# Patient Record
Sex: Female | Born: 1950 | Race: White | Hispanic: No | Smoking: Former smoker
Health system: Southern US, Community
[De-identification: ages and names within clinical notes are randomized; demographics above are authoritative.]

## PROBLEM LIST (undated history)

## (undated) DIAGNOSIS — C801 Malignant (primary) neoplasm, unspecified: Secondary | ICD-10-CM

## (undated) DIAGNOSIS — Z87442 Personal history of urinary calculi: Secondary | ICD-10-CM

## (undated) DIAGNOSIS — E039 Hypothyroidism, unspecified: Secondary | ICD-10-CM

## (undated) DIAGNOSIS — N189 Chronic kidney disease, unspecified: Secondary | ICD-10-CM

## (undated) DIAGNOSIS — N200 Calculus of kidney: Secondary | ICD-10-CM

## (undated) DIAGNOSIS — Z148 Genetic carrier of other disease: Secondary | ICD-10-CM

## (undated) DIAGNOSIS — E079 Disorder of thyroid, unspecified: Secondary | ICD-10-CM

## (undated) DIAGNOSIS — M8580 Other specified disorders of bone density and structure, unspecified site: Secondary | ICD-10-CM

## (undated) DIAGNOSIS — K635 Polyp of colon: Secondary | ICD-10-CM

## (undated) HISTORY — DX: Other specified disorders of bone density and structure, unspecified site: M85.80

## (undated) HISTORY — PX: DILATION AND CURETTAGE OF UTERUS: SHX78

## (undated) HISTORY — DX: Disorder of thyroid, unspecified: E07.9

## (undated) HISTORY — DX: Chronic kidney disease, unspecified: N18.9

## (undated) HISTORY — DX: Polyp of colon: K63.5

## (undated) HISTORY — PX: OTHER SURGICAL HISTORY: SHX169

## (undated) HISTORY — DX: Genetic carrier of other disease: Z14.8

## (undated) HISTORY — DX: Calculus of kidney: N20.0

---

## 2003-12-31 HISTORY — PX: JOINT REPLACEMENT: SHX530

## 2006-03-14 ENCOUNTER — Ambulatory Visit: Payer: Self-pay | Admitting: General Surgery

## 2006-03-14 HISTORY — PX: COLONOSCOPY W/ BIOPSIES: SHX1374

## 2007-12-31 DIAGNOSIS — K635 Polyp of colon: Secondary | ICD-10-CM

## 2007-12-31 HISTORY — DX: Polyp of colon: K63.5

## 2010-03-23 ENCOUNTER — Ambulatory Visit: Payer: Self-pay | Admitting: General Surgery

## 2010-03-23 HISTORY — PX: COLONOSCOPY: SHX174

## 2010-04-18 ENCOUNTER — Ambulatory Visit: Payer: Self-pay | Admitting: Unknown Physician Specialty

## 2010-05-10 ENCOUNTER — Ambulatory Visit: Payer: Self-pay | Admitting: General Practice

## 2010-05-17 ENCOUNTER — Ambulatory Visit: Payer: Self-pay | Admitting: Urology

## 2010-06-01 ENCOUNTER — Ambulatory Visit: Payer: Self-pay | Admitting: Urology

## 2010-07-26 ENCOUNTER — Ambulatory Visit: Payer: Self-pay | Admitting: Urology

## 2011-02-13 ENCOUNTER — Ambulatory Visit: Payer: Self-pay | Admitting: Internal Medicine

## 2011-02-13 ENCOUNTER — Ambulatory Visit: Payer: Self-pay | Admitting: Urology

## 2011-02-26 ENCOUNTER — Ambulatory Visit: Payer: Self-pay | Admitting: Internal Medicine

## 2011-02-28 ENCOUNTER — Ambulatory Visit: Payer: Self-pay | Admitting: Internal Medicine

## 2011-02-28 DIAGNOSIS — Z148 Genetic carrier of other disease: Secondary | ICD-10-CM

## 2011-02-28 HISTORY — DX: Genetic carrier of other disease: Z14.8

## 2011-03-10 LAB — HM MAMMOGRAPHY: HM Mammogram: NORMAL

## 2011-03-31 ENCOUNTER — Ambulatory Visit: Payer: Self-pay | Admitting: Internal Medicine

## 2011-06-26 ENCOUNTER — Ambulatory Visit: Payer: Self-pay | Admitting: Urology

## 2011-09-13 ENCOUNTER — Ambulatory Visit: Payer: Self-pay | Admitting: Internal Medicine

## 2011-09-25 ENCOUNTER — Encounter: Payer: Self-pay | Admitting: Internal Medicine

## 2011-09-25 ENCOUNTER — Ambulatory Visit (INDEPENDENT_AMBULATORY_CARE_PROVIDER_SITE_OTHER): Payer: PRIVATE HEALTH INSURANCE | Admitting: Internal Medicine

## 2011-09-25 VITALS — BP 100/60 | HR 67 | Temp 97.9°F | Resp 16 | Ht 64.0 in | Wt 121.8 lb

## 2011-09-25 DIAGNOSIS — M899 Disorder of bone, unspecified: Secondary | ICD-10-CM

## 2011-09-25 DIAGNOSIS — M1610 Unilateral primary osteoarthritis, unspecified hip: Secondary | ICD-10-CM

## 2011-09-25 DIAGNOSIS — Z124 Encounter for screening for malignant neoplasm of cervix: Secondary | ICD-10-CM

## 2011-09-25 DIAGNOSIS — M8580 Other specified disorders of bone density and structure, unspecified site: Secondary | ICD-10-CM | POA: Insufficient documentation

## 2011-09-25 DIAGNOSIS — Z1239 Encounter for other screening for malignant neoplasm of breast: Secondary | ICD-10-CM

## 2011-09-25 DIAGNOSIS — D126 Benign neoplasm of colon, unspecified: Secondary | ICD-10-CM | POA: Insufficient documentation

## 2011-09-25 DIAGNOSIS — Z87898 Personal history of other specified conditions: Secondary | ICD-10-CM | POA: Insufficient documentation

## 2011-09-25 DIAGNOSIS — D709 Neutropenia, unspecified: Secondary | ICD-10-CM

## 2011-09-25 DIAGNOSIS — E039 Hypothyroidism, unspecified: Secondary | ICD-10-CM

## 2011-09-25 DIAGNOSIS — Z8669 Personal history of other diseases of the nervous system and sense organs: Secondary | ICD-10-CM

## 2011-09-25 DIAGNOSIS — M169 Osteoarthritis of hip, unspecified: Secondary | ICD-10-CM | POA: Insufficient documentation

## 2011-09-25 NOTE — Assessment & Plan Note (Signed)
Last TSH was apparently in [place].  Repeat will be done next week.

## 2011-09-25 NOTE — Assessment & Plan Note (Signed)
By prior DEXA ordered by Annett Fabian, done at [facility] , date unclear.  Records requested.  Her rfs included prior history of tobacco abuse and low body weight.

## 2011-09-25 NOTE — Progress Notes (Signed)
  Subjective:    Patient ID: Alexis Mcdonald, female    DOB: 03/05/1951, 60 y.o.   MRN: [id number]  HPI   Healthy 60 yo white female with history of renal calculi presents for establishment of care at the referral of [facility].  No specific complaints today   Review of Systems  Constitutional: Negative for fever, chills and unexpected weight change.  HENT: Negative for hearing loss, ear pain, nosebleeds, congestion, sore throat, facial swelling, rhinorrhea, sneezing, mouth sores, trouble swallowing, neck pain, neck stiffness, voice change, postnasal drip, sinus pressure, tinnitus and ear discharge.   Eyes: Negative for pain, discharge, redness and visual disturbance.  Respiratory: Negative for cough, chest tightness, shortness of breath, wheezing and stridor.   Cardiovascular: Negative for chest pain, palpitations and leg swelling.  Musculoskeletal: Negative for myalgias and arthralgias.  Skin: Negative for color change and rash.  Neurological: Negative for dizziness, weakness, light-headedness and headaches.  Hematological: Negative for adenopathy.       Objective:   Physical Exam  Constitutional: She is oriented to person, place, and time. She appears well-developed and well-nourished.  HENT:  Mouth/Throat: Oropharynx is clear and moist.  Eyes: EOM are normal. Pupils are equal, round, and reactive to light. No scleral icterus.  Neck: Normal range of motion. Neck supple. No JVD present. No thyromegaly present.  Cardiovascular: Normal rate, regular rhythm, normal heart sounds and intact distal pulses.   Pulmonary/Chest: Effort normal and breath sounds normal.  Abdominal: Soft. Bowel sounds are normal. She exhibits no mass. There is no tenderness.  Musculoskeletal: Normal range of motion. She exhibits no edema.  Lymphadenopathy:    She has no cervical adenopathy.  Neurological: She is alert and oriented to person, place, and time.  Skin: Skin is warm and dry.    Psychiatric: She has a normal mood and affect.          Assessment & Plan:

## 2011-09-25 NOTE — Assessment & Plan Note (Signed)
She is a vegetarian, so the possibility of b12 deficiency is raised as a cuase of her neutropenia. She is scheduled for a bone marrow biopsy next week.

## 2011-09-30 ENCOUNTER — Ambulatory Visit: Payer: Self-pay | Admitting: Internal Medicine

## 2011-10-31 ENCOUNTER — Ambulatory Visit: Payer: Self-pay | Admitting: Internal Medicine

## 2011-12-02 ENCOUNTER — Other Ambulatory Visit: Payer: Self-pay | Admitting: Internal Medicine

## 2011-12-02 MED ORDER — LEVOTHYROXINE SODIUM 50 MCG PO TABS: 50.0000 ug | ORAL_TABLET | Freq: Every day | ORAL | Status: DC

## 2012-02-18 ENCOUNTER — Ambulatory Visit: Payer: Self-pay | Admitting: Internal Medicine

## 2012-02-18 LAB — CBC CANCER CENTER
Basophil #: 0 x10 3/mm (ref 0.0–0.1)
Basophil %: 0.4 %
Eosinophil #: 0 x10 3/mm (ref 0.0–0.7)
Lymphocyte #: 1.3 x10 3/mm (ref 1.0–3.6)
MCH: 31.8 pg (ref 26.0–34.0)
MCHC: 34.2 g/dL (ref 32.0–36.0)
Neutrophil %: 59.1 %
RBC: 4.01 10*6/uL (ref 3.80–5.20)
RDW: 13 % (ref 11.5–14.5)
WBC: 3.9 x10 3/mm (ref 3.6–11.0)

## 2012-02-28 ENCOUNTER — Ambulatory Visit: Payer: Self-pay | Admitting: Internal Medicine

## 2012-03-05 ENCOUNTER — Encounter: Payer: Self-pay | Admitting: Internal Medicine

## 2012-04-29 ENCOUNTER — Ambulatory Visit (INDEPENDENT_AMBULATORY_CARE_PROVIDER_SITE_OTHER): Payer: PRIVATE HEALTH INSURANCE | Admitting: Internal Medicine

## 2012-04-29 ENCOUNTER — Encounter: Payer: Self-pay | Admitting: Internal Medicine

## 2012-04-29 VITALS — BP 116/78 | HR 58 | Temp 97.9°F | Resp 14 | Ht 63.75 in | Wt 121.5 lb

## 2012-04-29 DIAGNOSIS — F320 Major depressive disorder, single episode, mild: Secondary | ICD-10-CM | POA: Insufficient documentation

## 2012-04-29 DIAGNOSIS — E039 Hypothyroidism, unspecified: Secondary | ICD-10-CM

## 2012-04-29 DIAGNOSIS — E785 Hyperlipidemia, unspecified: Secondary | ICD-10-CM

## 2012-04-29 DIAGNOSIS — D709 Neutropenia, unspecified: Secondary | ICD-10-CM

## 2012-04-29 DIAGNOSIS — Z0000 Encounter for general adult medical examination without abnormal findings: Secondary | ICD-10-CM

## 2012-04-29 DIAGNOSIS — Z1239 Encounter for other screening for malignant neoplasm of breast: Secondary | ICD-10-CM

## 2012-04-29 NOTE — Progress Notes (Signed)
Patient ID: Alexis Mcdonald, female   DOB: 09-Oct-1951, 61 y.o.   MRN: [id number] Patient Active Problem List  Diagnoses  . Neutropenia  . Colon polyp  . Borderline osteopenia  . Osteoarthritis of hip  . Hypothyroidism  . History of idiopathic seizure  . Screening for cervical cancer  . Screening for breast cancer  . Depression, major, single episode, mild    Subjective:  CC:   Chief Complaint  Patient presents with  . Gynecologic Exam    HPI:   Alexis Mcdonald a 61 y.o. female who presents  for her annual exam..  over the last 2 months she has had noted a recurrence of mild depressive symptoms which occur mostly in the morning. No anhedonia or anorexia. Is feeling a little down. This is not an anniversary time for her she is not sure what triggered it. Her relationship with her partner is the source of stress and she has good family support. She does continue to have difficulty with vaginal lubrication during intercourse due to postmenopausal status. She is using over-the-counter lubricants as she would like to avoid hormone therapy. She has a glass of wine a night. She works as a Child psychotherapist at [employer]. She exercises regularly herself. Wear seat belts whenever in a car.   Past Medical History  Diagnosis Date  . Neutropenia     no infections,  bone marrow biopsy by [facility] next week  . Colon polyp 2009    next one due 2012  . Borderline osteopenia     prior D[facility]A at [facility]   . renal calculi     s/p lithotripsy, [facility]  . Thyroid disease     hypothyroidism  . Kidney stones     Past Surgical History  Procedure Date  . Joint replacement 2005    done in [place]  . Dilation and curettage of uterus   . Right hip replacement   . Kidney stones removal     The following portions of the patient's history were reviewed and updated as appropriate: Allergies, current medications, and problem list.    Review of Systems:   12 Pt  review of  systems was negative except those addressed in the HPI,     History   Social History  . Marital Status: Divorced    Spouse Name: N/A    Number of Children: 2  . Years of Education: N/A   Occupational History  . 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 Smoking status: Former Smoker    Types: Cigarettes    Quit date: 09/24/2000  . Smokeless tobacco: Never Used  . Alcohol Use: Yes  . Drug Use: No  . Sexually Active: Not Currently   Other Topics Concern  . Not on file   Social History Narrative   Patient lived most her her life in [place] and moved to [place] to help care for aging mother    Objective:  BP 116/78  Pulse 58  Temp(Src) 97.9 F (36.6 C) (Oral)  Resp 14  Ht 5' 3.75" (1.619 m)  Wt 121 lb 8 oz (55.112 kg)  BMI 21.02 kg/m2  SpO2 99%   General Appearance:    Alert, cooperative, no distress, appears stated age  Head:    Normocephalic, without obvious abnormality, atraumatic  Eyes:    PERRL, conjunctiva/corneas clear, EOM's intact, fundi    benign, both eyes  Ears:    Normal TM's and external ear canals,  both ears  Nose:   Nares normal, septum midline, mucosa normal, no drainage    or sinus tenderness  Throat:   Lips, mucosa, and tongue normal; teeth and gums normal  Neck:   Supple, symmetrical, trachea midline, no adenopathy;    thyroid:  no enlargement/tenderness/nodules; no carotid   bruit or JVD  Back:     Symmetric, no curvature, ROM normal, no CVA tenderness  Lungs:     Clear to auscultation bilaterally, respirations unlabored  Chest Wall:    No tenderness or deformity   Heart:    Regular rate and rhythm, S1 and S2 normal, no murmur, rub   or gallop  Breast Exam:    No tenderness, masses, or nipple abnormality  Abdomen:     Soft, non-tender, bowel sounds active all four quadrants,    no masses, no organomegaly  Genitalia:    Normal female without lesion, discharge or tenderness  Rectal:    Normal tone, normal prostate, no  masses or tenderness;   guaiac negative stool  Extremities:   Extremities normal, atraumatic, no cyanosis or edema  Pulses:   2+ and symmetric all extremities  Skin:   Skin color, texture, turgor normal, no rashes or lesions  Lymph nodes:   Cervical, supraclavicular, and axillary nodes normal  Neurologic:   CNII-XII intact, normal strength, sensation and reflexes    throughout   Assessment and Plan: Neutropenia The results of her recent bone marrow biopsy are still pending and not available at sunrise. She found the procedure to be more painful than anticipated. She has no interest in doing that again  Depression, major, single episode, mild Prior episode of major depression was treated with Effexor successfully. She is requesting a retrial of this medication.  Screening for breast cancer This exam was done today and she is up-to-date on mammograms    Updated Medication List Outpatient Encounter Prescriptions as of 04/29/2012  Medication Sig Dispense Refill  . aspirin 81 MG tablet Take 81 mg by mouth daily.      Marland Kitchen CLINDAMYCIN HCL PO Take by mouth. For dental procedures       . fish oil-omega-3 fatty acids 1000 MG capsule Take 2 g by mouth daily.      Marland Kitchen ibuprofen (ADVIL,MOTRIN) 200 MG tablet Take 200 mg by mouth as needed.        Marland Kitchen levothyroxine (SYNTHROID, LEVOTHROID) 50 MCG tablet Take 1 tablet (50 mcg total) by mouth daily.  30 tablet  3  . nicotine polacrilex (NICORETTE) 4 MG gum Take 2 mg by mouth as needed.       . psyllium (METAMUCIL) 58.6 % packet Take 1 packet by mouth daily.        Marland Kitchen DISCONTD: fexofenadine-pseudoephedrine (ALLEGRA-D 24) 180-240 MG per 24 hr tablet Take 1 tablet by mouth as needed.        Marland Kitchen DISCONTD: Multiple Vitamin (MULTIVITAMIN) tablet Take 1 tablet by mouth daily.

## 2012-04-29 NOTE — Patient Instructions (Addendum)
If you decide you want to resume effexor,  Let me know.    We will send you your lab results by e mail if you sign up for MyChart.

## 2012-04-30 ENCOUNTER — Other Ambulatory Visit (HOSPITAL_COMMUNITY)
Admission: RE | Admit: 2012-04-30 | Discharge: 2012-04-30 | Disposition: A | Discharge status: Home / Self Care | Payer: PRIVATE HEALTH INSURANCE | Source: Ambulatory Visit | Attending: Internal Medicine | Admitting: Internal Medicine

## 2012-04-30 DIAGNOSIS — Z01419 Encounter for gynecological examination (general) (routine) without abnormal findings: Secondary | ICD-10-CM | POA: Insufficient documentation

## 2012-04-30 DIAGNOSIS — R87810 Cervical high risk human papillomavirus (HPV) DNA test positive: Secondary | ICD-10-CM | POA: Insufficient documentation

## 2012-04-30 LAB — TSH: TSH: 0.94 u[IU]/mL (ref 0.35–5.50)

## 2012-04-30 NOTE — Assessment & Plan Note (Signed)
Prior episode of major depression was treated with Effexor successfully. She is requesting a retrial of this medication.

## 2012-04-30 NOTE — Assessment & Plan Note (Signed)
The results of her recent bone marrow biopsy are still pending and not available at sunrise. She found the procedure to be more painful than anticipated. She has no interest in doing that again

## 2012-04-30 NOTE — Assessment & Plan Note (Signed)
This exam was done today and she is up-to-date on mammograms

## 2012-05-03 LAB — NMR LIPOPROFILE WITH LIPIDS
HDL Particle Number: 39.5 umol/L (ref >=30.5)
HDL-C: 99 mg/dL (ref >=40)
LDL Size: 21.7 nm (ref >20.5)
LP-IR Score: 0 (ref <=45)
Small LDL Particle Number: <90 nmol/L (ref <=527)

## 2012-05-09 LAB — HM PAP SMEAR: HM Pap smear: POSITIVE

## 2012-05-12 ENCOUNTER — Other Ambulatory Visit: Payer: Self-pay | Admitting: Internal Medicine

## 2012-05-12 MED ORDER — LEVOTHYROXINE SODIUM 50 MCG PO TABS: 50.0000 ug | ORAL_TABLET | Freq: Every day | ORAL | Status: DC

## 2012-05-15 ENCOUNTER — Telehealth: Payer: Self-pay | Admitting: Internal Medicine

## 2012-05-15 NOTE — Telephone Encounter (Signed)
Patient notified

## 2012-05-15 NOTE — Telephone Encounter (Signed)
Patient called worried about the results of her PAP smear and that the HPV was positive.  She stated she is in a monogamous relationship and she is worried she got this from her partner or if she is passing it to her partner.  She stated she does not ever remember Dr. Barnabas Lister telling her she had HPV.  She also stated she was treated for warts on her anus over 30 years ago and wanted to know if this could have anything to do with that.  Please advise.

## 2012-05-15 NOTE — Telephone Encounter (Signed)
Yes, the fact that she had anal warts 30 years ago  means she has probably had a remote exposure to HPV.  I have seen these tests come back positive on women who have been celibate for years!. If she wants to make an appt to discuss, she is welcome to, but there is noting to do except repeat her PAPs annually until it clears.

## 2012-06-16 ENCOUNTER — Ambulatory Visit: Payer: Self-pay | Admitting: Internal Medicine

## 2012-06-16 LAB — CBC CANCER CENTER
Basophil %: 0.8 %
Eosinophil #: 0 x10 3/mm (ref 0.0–0.7)
Eosinophil %: 0.8 %
HCT: 39.9 % (ref 35.0–47.0)
Lymphocyte %: 33.4 %
MCH: 31 pg (ref 26.0–34.0)
Monocyte #: 0.3 x10 3/mm (ref 0.2–0.9)
Neutrophil #: 1.9 x10 3/mm (ref 1.4–6.5)
Neutrophil %: 55.7 %
Platelet: 146 x10 3/mm — ABNORMAL LOW (ref 150–440)

## 2012-06-29 ENCOUNTER — Ambulatory Visit: Payer: Self-pay | Admitting: Internal Medicine

## 2012-07-08 ENCOUNTER — Ambulatory Visit: Payer: Self-pay | Admitting: Urology

## 2012-07-10 ENCOUNTER — Ambulatory Visit: Payer: Self-pay | Admitting: Urology

## 2012-08-21 ENCOUNTER — Other Ambulatory Visit: Payer: Self-pay | Admitting: Internal Medicine

## 2012-08-21 MED ORDER — LEVOTHYROXINE SODIUM 50 MCG PO TABS: 50.0000 ug | ORAL_TABLET | Freq: Every day | ORAL | Status: DC

## 2012-10-20 ENCOUNTER — Ambulatory Visit: Payer: Self-pay | Admitting: Internal Medicine

## 2012-10-20 LAB — CBC CANCER CENTER
Basophil #: 0 x10 3/mm (ref 0.0–0.1)
Eosinophil #: 0 x10 3/mm (ref 0.0–0.7)
Eosinophil %: 0.9 %
HCT: 40.4 % (ref 35.0–47.0)
Lymphocyte #: 1.2 x10 3/mm (ref 1.0–3.6)
Lymphocyte %: 39.4 %
MCH: 30.8 pg (ref 26.0–34.0)
MCHC: 32.2 g/dL (ref 32.0–36.0)
MCV: 96 fL (ref 80–100)
Monocyte #: 0.3 x10 3/mm (ref 0.2–0.9)
Neutrophil %: 50.4 %
Platelet: 131 x10 3/mm — ABNORMAL LOW (ref 150–440)
RDW: 12.9 % (ref 11.5–14.5)
WBC: 3.1 x10 3/mm — ABNORMAL LOW (ref 3.6–11.0)

## 2012-10-30 ENCOUNTER — Ambulatory Visit: Payer: Self-pay | Admitting: Internal Medicine

## 2013-02-13 ENCOUNTER — Other Ambulatory Visit: Payer: Self-pay

## 2013-02-16 ENCOUNTER — Ambulatory Visit: Payer: Self-pay | Admitting: Internal Medicine

## 2013-02-16 LAB — CBC CANCER CENTER
Basophil #: 0 x10 3/mm (ref 0.0–0.1)
Eosinophil #: 0 x10 3/mm (ref 0.0–0.7)
HCT: 38.5 % (ref 35.0–47.0)
HGB: 13 g/dL (ref 12.0–16.0)
Lymphocyte #: 1.3 x10 3/mm (ref 1.0–3.6)
Lymphocyte %: 28.9 %
MCHC: 33.6 g/dL (ref 32.0–36.0)
Monocyte #: 0.3 x10 3/mm (ref 0.2–0.9)
Neutrophil %: 63 %
WBC: 4.4 x10 3/mm (ref 3.6–11.0)

## 2013-02-27 ENCOUNTER — Ambulatory Visit: Payer: Self-pay | Admitting: Internal Medicine

## 2013-03-23 ENCOUNTER — Other Ambulatory Visit: Payer: Self-pay | Admitting: Internal Medicine

## 2013-03-23 NOTE — Telephone Encounter (Signed)
Ok to refill,  Authorized in [employer].  Call patient to remind her for schedule an annula physical with fasting labs, prior

## 2013-03-23 NOTE — Telephone Encounter (Signed)
Pt has not been seen since 5/13 that is when last TSH was drawn. Please advise.

## 2013-03-25 NOTE — Telephone Encounter (Signed)
Can you schedule pt for a CPE and Fasting labs

## 2013-03-26 ENCOUNTER — Telehealth: Payer: Self-pay | Admitting: Internal Medicine

## 2013-03-26 NOTE — Telephone Encounter (Signed)
Noted. Thanks.

## 2013-03-26 NOTE — Telephone Encounter (Signed)
Scheduled pt for CPE 5/9 and fasting labs 5/7 per previous ph notes.

## 2013-05-04 ENCOUNTER — Telehealth: Payer: Self-pay | Admitting: *Deleted

## 2013-05-04 DIAGNOSIS — E559 Vitamin D deficiency, unspecified: Secondary | ICD-10-CM

## 2013-05-04 DIAGNOSIS — E785 Hyperlipidemia, unspecified: Secondary | ICD-10-CM

## 2013-05-04 DIAGNOSIS — R5381 Other malaise: Secondary | ICD-10-CM

## 2013-05-04 NOTE — Telephone Encounter (Signed)
Pt is coming in for labs tomorrow 05.07.2014 what labs and dx would you like?  Thank you

## 2013-05-05 ENCOUNTER — Other Ambulatory Visit (INDEPENDENT_AMBULATORY_CARE_PROVIDER_SITE_OTHER): Payer: BC Managed Care – PPO

## 2013-05-05 DIAGNOSIS — E785 Hyperlipidemia, unspecified: Secondary | ICD-10-CM

## 2013-05-05 DIAGNOSIS — R5381 Other malaise: Secondary | ICD-10-CM

## 2013-05-05 DIAGNOSIS — E559 Vitamin D deficiency, unspecified: Secondary | ICD-10-CM

## 2013-05-05 LAB — COMPREHENSIVE METABOLIC PANEL
ALT: 13 U/L (ref 0–35)
AST: 17 U/L (ref 0–37)
Albumin: 3.8 g/dL (ref 3.5–5.2)
CO2: 24 mEq/L (ref 19–32)
Calcium: 8.7 mg/dL (ref 8.4–10.5)
Chloride: 107 mEq/L (ref 96–112)
GFR: 101.55 mL/min (ref >60.00)
Potassium: 4.3 mEq/L (ref 3.5–5.1)
Sodium: 137 mEq/L (ref 135–145)
Total Protein: 6.7 g/dL (ref 6.0–8.3)

## 2013-05-05 LAB — CBC WITH DIFFERENTIAL/PLATELET
Basophils Absolute: 0 10*3/uL (ref 0.0–0.1)
Eosinophils Absolute: 0 10*3/uL (ref 0.0–0.7)
Hemoglobin: 13 g/dL (ref 12.0–15.0)
Lymphocytes Relative: 20.4 % (ref 12.0–46.0)
MCHC: 34 g/dL (ref 30.0–36.0)
Monocytes Relative: 7.7 % (ref 3.0–12.0)
Neutrophils Relative %: 71 % (ref 43.0–77.0)
RBC: 4.1 Mil/uL (ref 3.87–5.11)
RDW: 12.9 % (ref 11.5–14.6)

## 2013-05-05 LAB — LIPID PANEL
LDL Cholesterol: 81 mg/dL (ref 0–99)
Total CHOL/HDL Ratio: 2
Triglycerides: 46 mg/dL (ref 0.0–149.0)

## 2013-05-06 LAB — VITAMIN D 25 HYDROXY (VIT D DEFICIENCY, FRACTURES): Vit D, 25-Hydroxy: 31 ng/mL (ref 30–89)

## 2013-05-07 ENCOUNTER — Other Ambulatory Visit (HOSPITAL_COMMUNITY)
Admission: RE | Admit: 2013-05-07 | Discharge: 2013-05-07 | Disposition: A | Discharge status: Home / Self Care | Payer: BC Managed Care – HMO | Source: Ambulatory Visit | Attending: Internal Medicine | Admitting: Internal Medicine

## 2013-05-07 ENCOUNTER — Ambulatory Visit (INDEPENDENT_AMBULATORY_CARE_PROVIDER_SITE_OTHER): Payer: BC Managed Care – PPO | Admitting: Internal Medicine

## 2013-05-07 ENCOUNTER — Encounter: Payer: Self-pay | Admitting: Internal Medicine

## 2013-05-07 VITALS — BP 110/78 | HR 60 | Temp 98.0°F | Resp 14 | Ht 64.0 in | Wt 120.2 lb

## 2013-05-07 DIAGNOSIS — Z0000 Encounter for general adult medical examination without abnormal findings: Secondary | ICD-10-CM

## 2013-05-07 DIAGNOSIS — Z124 Encounter for screening for malignant neoplasm of cervix: Secondary | ICD-10-CM

## 2013-05-07 DIAGNOSIS — Z1239 Encounter for other screening for malignant neoplasm of breast: Secondary | ICD-10-CM

## 2013-05-07 DIAGNOSIS — D126 Benign neoplasm of colon, unspecified: Secondary | ICD-10-CM

## 2013-05-07 DIAGNOSIS — Z01419 Encounter for gynecological examination (general) (routine) without abnormal findings: Secondary | ICD-10-CM | POA: Insufficient documentation

## 2013-05-07 DIAGNOSIS — D709 Neutropenia, unspecified: Secondary | ICD-10-CM

## 2013-05-07 DIAGNOSIS — Z1151 Encounter for screening for human papillomavirus (HPV): Secondary | ICD-10-CM | POA: Insufficient documentation

## 2013-05-07 DIAGNOSIS — D696 Thrombocytopenia, unspecified: Secondary | ICD-10-CM

## 2013-05-07 DIAGNOSIS — R87810 Cervical high risk human papillomavirus (HPV) DNA test positive: Secondary | ICD-10-CM

## 2013-05-07 DIAGNOSIS — Z1211 Encounter for screening for malignant neoplasm of colon: Secondary | ICD-10-CM

## 2013-05-07 DIAGNOSIS — Z23 Encounter for immunization: Secondary | ICD-10-CM

## 2013-05-07 MED ORDER — ALPRAZOLAM 0.25 MG PO TABS: 0.2500 mg | ORAL_TABLET | Freq: Every evening | ORAL | Status: DC | PRN

## 2013-05-07 NOTE — Patient Instructions (Addendum)
Your cholesterol is excellent!  If you cannot access your labs via MyChart let me know.  I will  Arrange follow up with Dr. Evette Cristal for your colonoscopy, and mammogram at [facility]  You were given the TDaP (tetanus diptheria and Pertussis vaccine ) tday

## 2013-05-07 NOTE — Progress Notes (Addendum)
Patient ID: Alexis Mcdonald, female   DOB: 1951/06/12, 62 y.o.   MRN: [id number]   Subjective:     Alexis Mcdonald is a 62 y.o. female and is here for a comprehensive physical exam. The patient reports a seasonal pattern of intermittent insomnia lasting 3 weeks ,  mainly with sleep initiation nor maintenance.  She notes feeling tired when she lies down but is too wired to fall asleep. She observes good sleep hygiene, including avoiding use  of computer screens before bed.  And does not use alcohol during the week.    History   Social History  . Marital Status: Divorced    Spouse Name: N/A    Number of Children: 2  . Years of Education: N/A   Occupational History  . 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 Smoking status: Former Smoker    Types: Cigarettes    Quit date: 09/24/2000  . Smokeless tobacco: Never Used  . Alcohol Use: Yes  . Drug Use: No  . Sexually Active: Not Currently   Other Topics Concern  . Not on file   Social History Narrative   Patient lived most her her life in [place] and moved to [place] to help care for aging mother   Health Maintenance  Topic Date Due  . Colonoscopy  08/30/2000  . Zostavax  08/30/2010  . Mammogram  03/28/2013  . Influenza Vaccine  08/30/2013  . Pap Smear  05/01/2015  . Tetanus/tdap  05/08/2023    The following portions of the patient's history were reviewed and updated as appropriate: allergies, current medications, past family history, past medical history, past social history, past surgical history and problem list.  Review of Systems A comprehensive review of systems was negative.   Objective:   BP 110/78  Pulse 60  Temp(Src) 98 F (36.7 C) (Oral)  Resp 14  Ht 5\' 4"  (1.626 m)  Wt 120 lb 4 oz (54.545 kg)  BMI 20.63 kg/m2  SpO2 98%  General Appearance:    Alert, cooperative, no distress, appears stated age  Head:    Normocephalic, without obvious abnormality, atraumatic  Eyes:     PERRL, conjunctiva/corneas clear, EOM's intact, fundi    benign, both eyes  Ears:    Normal TM's and external ear canals, both ears  Nose:   Nares normal, septum midline, mucosa normal, no drainage    or sinus tenderness  Throat:   Lips, mucosa, and tongue normal; teeth and gums normal  Neck:   Supple, symmetrical, trachea midline, no adenopathy;    thyroid:  no enlargement/tenderness/nodules; no carotid   bruit or JVD  Back:     Symmetric, no curvature, ROM normal, no CVA tenderness  Lungs:     Clear to auscultation bilaterally, respirations unlabored  Chest Wall:    No tenderness or deformity   Heart:    Regular rate and rhythm, S1 and S2 normal, no murmur, rub   or gallop  Breast Exam:    No tenderness, masses, or nipple abnormality  Abdomen:     Soft, non-tender, bowel sounds active all four quadrants,    no masses, no organomegaly  Genitalia:    Pelvic: cervix normal in appearance, external genitalia normal, no adnexal masses or tenderness, no cervical motion tenderness, rectovaginal septum normal, uterus normal size, shape, and consistency and vagina normal without discharge  Extremities:   Extremities normal, atraumatic, no cyanosis or edema  Pulses:   2+ and symmetric all  extremities  Skin:   Skin color, texture, turgor normal, no rashes or lesions  Lymph nodes:   Cervical, supraclavicular, and axillary nodes normal  Neurologic:   CNII-XII intact, normal strength, sensation and reflexes    throughout     Assessment:   Adenomatous polyp of colon Noted on 2008 colonoscopy.  2011 colonoscopy was prepped poorly per sankars note so repeat is likely due 2014.   Cervical high risk human papillomavirus (HPV) DNA test positive Repeat PAP with HPV  was done this year per guidelines  Neutropenia, unspecified With thrombocytopenia,  Followed by [facility].  Workup for viral hepatitis, autoimmune disorders, MM and HH done in 2012 by Ardyth Gal and only a single mutation for [phone] for [facility]  was found. EBC and plts are stable per repeat check here.  Will forward to Dr. Sherrlyn Hock  Routine general medical examination at a health care facility Annual comprehensive exam was done including breast, pelvic and PAP smear. All screenings have been addressed .     Updated Medication List Outpatient Encounter Prescriptions as of 05/07/2013  Medication Sig Dispense Refill  . aspirin 81 MG tablet Take 81 mg by mouth daily.      . fish oil-omega-3 fatty acids 1000 MG capsule Take 2 g by mouth daily.      Marland Kitchen ibuprofen (ADVIL,MOTRIN) 200 MG tablet Take 200 mg by mouth as needed.        Marland Kitchen levothyroxine (SYNTHROID, LEVOTHROID) 50 MCG tablet TAKE 1 TABLET BY MOUTH DAILY.  30 tablet  3  . nicotine polacrilex (NICORETTE) 4 MG gum Take 2 mg by mouth as needed.       . psyllium (METAMUCIL) 58.6 % packet Take 1 packet by mouth daily.        Marland Kitchen ALPRAZolam (XANAX) 0.25 MG tablet Take 1 tablet (0.25 mg total) by mouth at bedtime as needed for sleep.  30 tablet  0  . CLINDAMYCIN HCL PO Take by mouth. For dental procedures        No facility-administered encounter medications on file as of 05/07/2013.

## 2013-05-09 ENCOUNTER — Encounter: Payer: Self-pay | Admitting: Internal Medicine

## 2013-05-09 DIAGNOSIS — R87810 Cervical high risk human papillomavirus (HPV) DNA test positive: Secondary | ICD-10-CM | POA: Insufficient documentation

## 2013-05-09 DIAGNOSIS — Z148 Genetic carrier of other disease: Secondary | ICD-10-CM | POA: Insufficient documentation

## 2013-05-09 NOTE — Assessment & Plan Note (Signed)
Annual comprehensive exam was done including breast, pelvic and PAP smear. All screenings have been addressed .  

## 2013-05-09 NOTE — Assessment & Plan Note (Signed)
With thrombocytopenia,  Followed by [facility].  Workup for viral hepatitis, autoimmune disorders, MM and HH done in 2012 by Ardyth Gal and only a single mutation for [phone] for [facility] was found. EBC and plts are stable per repeat check here.  Will forward to Dr. Sherrlyn Hock

## 2013-05-09 NOTE — Assessment & Plan Note (Signed)
Repeat PAP with HPV  was done this year per guidelines

## 2013-05-09 NOTE — Assessment & Plan Note (Signed)
Noted on 2008 colonoscopy.  2011 colonoscopy was prepped poorly per sankars note so repeat is likely due 2014.

## 2013-05-10 ENCOUNTER — Encounter: Payer: Self-pay | Admitting: Emergency Medicine

## 2013-05-10 LAB — HM MAMMOGRAPHY: HM MAMMO: NORMAL

## 2013-05-10 LAB — HM PAP SMEAR: HM PAP: NORMAL

## 2013-05-11 ENCOUNTER — Telehealth: Payer: Self-pay | Admitting: Internal Medicine

## 2013-05-11 NOTE — Telephone Encounter (Signed)
See below my chart message  Appointment Request From: Ezekiel Slocumb  With Provider: Duncan Dull, MD [-Primary Care Physician-]  Preferred Date Range: From 05/17/2013 To 05/21/2013  Preferred Times: Monday Afternoon, Tuesday Afternoon, Thursday Afternoon, Friday Afternoon  Reason: To address the following health maintenance concerns. Mammogram Comments:

## 2013-05-13 ENCOUNTER — Encounter: Payer: Self-pay | Admitting: Internal Medicine

## 2013-05-31 ENCOUNTER — Encounter: Payer: Self-pay | Admitting: Internal Medicine

## 2013-06-01 ENCOUNTER — Telehealth: Payer: Self-pay

## 2013-06-01 ENCOUNTER — Ambulatory Visit (INDEPENDENT_AMBULATORY_CARE_PROVIDER_SITE_OTHER): Payer: BC Managed Care – PPO | Admitting: General Surgery

## 2013-06-01 ENCOUNTER — Encounter: Payer: Self-pay | Admitting: General Surgery

## 2013-06-01 ENCOUNTER — Other Ambulatory Visit: Payer: Self-pay | Admitting: Internal Medicine

## 2013-06-01 VITALS — BP 120/78 | HR 58 | Resp 12 | Ht 65.0 in | Wt 121.0 lb

## 2013-06-01 DIAGNOSIS — Z860101 Personal history of adenomatous and serrated colon polyps: Secondary | ICD-10-CM | POA: Insufficient documentation

## 2013-06-01 DIAGNOSIS — B351 Tinea unguium: Secondary | ICD-10-CM

## 2013-06-01 DIAGNOSIS — Z86010 Personal history of colonic polyps: Secondary | ICD-10-CM

## 2013-06-01 MED ORDER — TERBINAFINE HCL 250 MG PO TABS: 250.0000 mg | ORAL_TABLET | Freq: Every day | ORAL | Status: DC

## 2013-06-01 NOTE — Telephone Encounter (Signed)
Please Advise:   Patient Message:The toe nail fungus I mentioned on last visit has now caused cracked nails on each big toe nail. I am filing and applying vinegar every night as you suggested. I am concerned about losing the nails. Do I need a more aggressive treatment or continue the filing and vinegar? What do you suggest?

## 2013-06-01 NOTE — Patient Instructions (Addendum)
Colonoscopy with possible biopsy/polypectomy prn: Information regarding the procedure, including its potential risks and complications (including but not limited to perforation of the bowel, which may require emergency surgery to repair, and bleeding) was verbally given to the patient. Educational information regarding lower instestinal endoscopy was given to the patient. Written instructions for how to complete the bowel prep using Miralax were provided. The importance of drinking ample fluids to avoid dehydration as a result of the prep emphasized.

## 2013-06-01 NOTE — Progress Notes (Signed)
Patient ID: Alexis Mcdonald, female   DOB: 1951-04-22, 62 y.o.   MRN: [id number]  Chief Complaint  Patient presents with  . Other    pre op colonoscopy    HPI Alexis Mcdonald is a 62 y.o. female. Patient here today for pre op colonoscopy last done March 2011, diverticulosis and small colon polyps. Denies any rectal issues.  Denies family history of colon cancer.    HPI  Past Medical History  Diagnosis Date  . Neutropenia     no infections,  bone marrow biopsy by Alexis Mcdonald next week  . Colon polyp 2009    next one due 2012  . Borderline osteopenia     prior DEXA at [facility]   . renal calculi     s/p lithotripsy, [facility]  . Thyroid disease     hypothyroidism  . Kidney stones   . 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 Joint replacement  2005    done in [place]  . Dilation and curettage of uterus    . Right hip replacement    . Kidney stones removal      Family History  Problem Relation Age of Onset  . Diabetes Mother   . Stroke Mother   . Hyperlipidemia Mother   . Hypertension Mother   . Alcohol abuse Father   . Diabetes Brother   . Alcohol abuse Brother   . Hypertension Brother     Social History History  Substance Use Topics  . Smoking status: Former Smoker    Types: Cigarettes    Quit date: 09/24/2000  . Smokeless tobacco: Never Used  . Alcohol Use: Yes    Allergies  Allergen Reactions  . Penicillins     Current Outpatient Prescriptions  Medication Sig Dispense Refill  . ALPRAZolam (XANAX) 0.25 MG tablet Take 1 tablet (0.25 mg total) by mouth at bedtime as needed for sleep.  30 tablet  0  . aspirin 81 MG tablet Take 81 mg by mouth daily.      Alexis Mcdonald CLINDAMYCIN HCL PO Take by mouth. For dental procedures       . fish oil-omega-3 fatty acids 1000 MG capsule Take 2 g by mouth daily.      Alexis Mcdonald ibuprofen (ADVIL,MOTRIN) 200 MG tablet Take 200 mg by  mouth as needed.        Alexis Mcdonald levothyroxine (SYNTHROID, LEVOTHROID) 50 MCG tablet TAKE 1 TABLET BY MOUTH DAILY.  30 tablet  3  . nicotine polacrilex (NICORETTE) 4 MG gum Take 2 mg by mouth as needed.       . psyllium (METAMUCIL) 58.6 % packet Take 1 packet by mouth daily.        Alexis Mcdonald terbinafine (LAMISIL) 250 MG tablet Take 1 tablet (250 mg total) by mouth daily.  90 tablet  0   No current facility-administered medications for this visit.    Review of Systems Review of Systems  Constitutional: Negative.   Respiratory: Negative.   Cardiovascular: Negative.     Blood pressure 120/78, pulse 58, resp. rate 12, height 5\' 5"  (1.651 m), weight 121 lb (54.885 kg).  Physical Exam Physical Exam  Constitutional: She is oriented to person, place, and time. She appears well-developed and well-nourished.  Eyes: Conjunctivae are normal.  Cardiovascular: Normal rate and regular rhythm.   Pulmonary/Chest: Effort normal and breath sounds normal.  Abdominal: Soft. Normal appearance and bowel  sounds are normal. There is no hepatosplenomegaly.  Lymphadenopathy:    She has no cervical adenopathy.  Neurological: She is alert and oriented to person, place, and time.  Skin: Skin is warm.    Data Reviewed Prior colonoscopy, poor prep inadequate for identifying small polyps  Assessment    Past history of colon polyps     Plan    surveillance colonoscopy with Golytely prep.       Alexis Mcdonald G 06/03/2013, 6:28 AM

## 2013-06-03 ENCOUNTER — Encounter: Payer: Self-pay | Admitting: General Surgery

## 2013-06-04 ENCOUNTER — Telehealth: Payer: Self-pay

## 2013-06-04 NOTE — Telephone Encounter (Signed)
My Chart Message: I am sorry to hear that conservative home therapy has not helped. I have sent a prescription for Lamisil to your pharmacy. It has to be taken once daily for 12 weeks.  You will need to have liver tests repeated in 4 weeks on this medication.   Called and left message for patient to either take a look at her mychart message or to call the office back.

## 2013-06-07 ENCOUNTER — Telehealth: Payer: Self-pay | Admitting: *Deleted

## 2013-06-07 NOTE — Telephone Encounter (Signed)
Patient called back [place] say [place]. She will be calling back on Wednesday, 06-09-13, to arrange a date for colonoscopy.   NuLytely prescription has been called in to patient's pharmacy.

## 2013-06-07 NOTE — Telephone Encounter (Signed)
I have left a message for patient to call the office.   We need to arrange colonoscopy.

## 2013-06-14 ENCOUNTER — Telehealth: Payer: Self-pay | Admitting: *Deleted

## 2013-06-14 NOTE — Telephone Encounter (Signed)
Patient has been scheduled for a colonoscopy on 07-20-13 at [facility]. This patient needs to discontinue fish oil one week prior to procedure. She will call if she has further questions. Prep has already been called in to patient's pharmacy.

## 2013-06-22 ENCOUNTER — Telehealth: Payer: Self-pay | Admitting: Internal Medicine

## 2013-06-22 NOTE — Telephone Encounter (Signed)
Please advise patient states rash localized to abdomen.

## 2013-06-22 NOTE — Telephone Encounter (Signed)
Patient Information:  Caller Name: Daleen  Phone: [phone]  Patient: Alexis Mcdonald, Alexis Mcdonald  Gender: Female  DOB: Oct 31, 1951  Age: 62 Years  PCP: Duncan Dull (Adults only)  Office Follow Up:  Does the office need to follow up with this patient?: Yes  Instructions For The Office: Please see pt's symptoms - possible side effect of Lamisil.  Advise pt if Dr Darrick Huntsman feels like it is a side effect and if pt needs to continute medication or stop.   Symptoms  Reason For Call & Symptoms: Started on Lamisil on 06/01/13.     Onset 06/22/13 Rash on entire stomach, small red bumps, not itchy, not painful afebrile.  Pt worried due to possible side effect of Lamisil  Reviewed Health History In EMR: Yes  Reviewed Medications In EMR: Yes  Reviewed Allergies In EMR: Yes  Reviewed Surgeries / Procedures: Yes  Date of Onset of Symptoms: 06/22/2013  Guideline(s) Used:  Rash or Redness - Localized  Disposition Per Guideline:   Home Care  Reason For Disposition Reached:   Mild localized rash  Advice Given:  Avoid Soap:  Wash the area once thoroughly with soap to remove any remaining irritants. Thereafter avoid soaps to this area. Cleanse the area when needed with warm water.  Expected Course:  Most of these rashes pass in 2 to 3 days.  Call Back If:  Rash spreads or becomes worse  Rash lasts longer than 1 week  You become worse.  Patient Will Follow Care Advice:  YES

## 2013-06-22 NOTE — Telephone Encounter (Signed)
Yes, it could be,  Stop the lamasil .  If rash spreads,  She will need to be seen.

## 2013-06-24 ENCOUNTER — Encounter: Payer: Self-pay | Admitting: Internal Medicine

## 2013-06-24 ENCOUNTER — Telehealth: Payer: Self-pay | Admitting: Internal Medicine

## 2013-06-24 NOTE — Telephone Encounter (Signed)
Patient Information:  Caller Name: Federica  Phone: [phone]  Patient: Alexis Mcdonald, Alexis Mcdonald  Gender: Female  DOB: [birth year]  Age: 62 Years  PCP: Duncan Dull (Adults only)  Office Follow Up:  Does the office need to follow up with this patient?: Yes  Instructions For The Office: Call back or My Chart reply needed for additional question about use of daily sunscreen with rash.  RN Note:  Declined triage.  Per Epic message 06/24/13 from Dr Darrick Huntsman, just stopped the Lamisil; will call back if rash worsens. Last Lamisil dose was 06/23/13. 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 Please send response via My Chart or call back.  Symptoms  Reason For Call & Symptoms: Called regarding spreading, fine papular, red, non itchy rash.  Concerned it is related to use of Lamisil started in early June.  Denies hives.  Reviewed Health History In EMR: Yes  Reviewed Medications In EMR: Yes  Reviewed Allergies In EMR: Yes  Reviewed Surgeries / Procedures: Yes  Date of Onset of Symptoms: 06/21/2013  Treatments Tried: washing with warm water.  Treatments Tried Worked: No  Guideline(s) Used:  No Protocol Available - Information Only  Disposition Per Guideline:   Discuss with PCP and Callback by Nurse Today  Reason For Disposition Reached:   Nursing judgment  Advice Given:  N/A  Patient Will Follow Care Advice:  YES

## 2013-06-24 NOTE — Telephone Encounter (Signed)
Can patient continue sunscreen please advise with rash.?

## 2013-06-24 NOTE — Telephone Encounter (Signed)
Left detailed message.   

## 2013-06-28 ENCOUNTER — Encounter: Payer: Self-pay | Admitting: Internal Medicine

## 2013-06-28 ENCOUNTER — Telehealth: Payer: Self-pay | Admitting: Internal Medicine

## 2013-06-28 NOTE — Telephone Encounter (Signed)
FYI, has appt tomorrow with Dr. Darrick Huntsman

## 2013-06-28 NOTE — Telephone Encounter (Signed)
Patient Information:  Caller Name: Alexis Mcdonald  Phone: [phone]  Patient: Alexis Mcdonald  Gender: Female  DOB: August 26, 1951  Age: 62 Years  PCP: Duncan Dull (Adults only)  Office Follow Up:  Does the office need to follow up with this patient?: No  Instructions For The Office: N/A   Symptoms  Reason For Call & Symptoms: Alexis Mcdonald states she has a rash . Started Lamisil in early June  06/01/13 due to toenail fungus. States she called last week due to rash . Was advised to stop Lamisil and use sunscreen on 06/24/13. States rash is worse. Has  pinpoint pink- red  raised rash from neck to feet onset 06/21/13. Not itchy. Rash is irritable when clothes or hair touch rash. Per rash protocol has see provider today or tomorrow due to mild widespread rash. Appt scheduled in EPIC for 1600 on 06/29/13. Care advice given.  Reviewed Health History In EMR: Yes  Reviewed Medications In EMR: Yes  Reviewed Allergies In EMR: Yes  Reviewed Surgeries / Procedures: Yes  Date of Onset of Symptoms: 06/21/2013  Treatments Tried: Sunscreen  Treatments Tried Worked: No  Guideline(s) Used:  Rash or Redness - Widespread  Disposition Per Guideline:   See Today or Tomorrow in Office  Reason For Disposition Reached:   Mild widespread rash  Advice Given:  Call Back If:   You become worse  Patient Will Follow Care Advice:  YES  Appointment Scheduled:  06/29/2013 16:00:00 Appointment Scheduled Provider:  Duncan Dull (Adults only)

## 2013-06-29 ENCOUNTER — Ambulatory Visit (INDEPENDENT_AMBULATORY_CARE_PROVIDER_SITE_OTHER): Payer: BC Managed Care – PPO | Admitting: Internal Medicine

## 2013-06-29 ENCOUNTER — Encounter: Payer: Self-pay | Admitting: Internal Medicine

## 2013-06-29 VITALS — BP 128/78 | HR 57 | Temp 98.5°F | Resp 16 | Wt 122.8 lb

## 2013-06-29 DIAGNOSIS — T887XXA Unspecified adverse effect of drug or medicament, initial encounter: Secondary | ICD-10-CM

## 2013-06-29 DIAGNOSIS — T50905A Adverse effect of unspecified drugs, medicaments and biological substances, initial encounter: Secondary | ICD-10-CM | POA: Insufficient documentation

## 2013-06-29 MED ORDER — PREDNISONE 10 MG PO TABS: 60.0000 mg | ORAL_TABLET | Freq: Every day | ORAL | Status: DC

## 2013-06-29 MED ORDER — HYDROXYZINE HCL 25 MG PO TABS: 25.0000 mg | ORAL_TABLET | Freq: Three times a day (TID) | ORAL | Status: DC | PRN

## 2013-06-29 NOTE — Patient Instructions (Addendum)
I am prescribing a prednisone ,  A systemic steroid for your drug reaction,  Using 10 mg tablets 9sent to pharmacy)   60 mg daily for 4 days,  Then decrease by 10 mg daily until gone:  50 mg on day 5,   40 mg on Day 6,   30 mg on day 7 ,  etc until gone   Cetirizine (zyrtec) 10 mg daily as your antihistamine  Hydroxyzine for itching (sent to pharmacy)   If you develop fever,  Swollen lymph nodes,  Bleeding gums.,  Call us immediately or go to ER

## 2013-06-29 NOTE — Progress Notes (Signed)
Patient ID: Alexis Mcdonald, female   DOB: 22-Feb-1951, 62 y.o.   MRN: [id number]  Patient Active Problem List   Diagnosis Date Noted  . Adverse drug reaction 06/29/2013  . Personal history of colonic polyps 06/01/2013  . Cervical high risk human papillomavirus (HPV) DNA test positive 05/09/2013  . Hemochromatosis carrier   . Thrombocytopenia, unspecified 05/07/2013  . Routine general medical examination at a health care facility 05/07/2013  . Depression, major, single episode, mild 04/29/2012  . Osteoarthritis of hip 09/25/2011  . Hypothyroidism 09/25/2011  . History of idiopathic seizure 09/25/2011  . Screening for cervical cancer 09/25/2011  . Screening for breast cancer 09/25/2011  . Neutropenia, unspecified   . Adenomatous polyp of colon   . Borderline osteopenia     Subjective:  CC:   Chief Complaint  Patient presents with  . Acute Visit    rash, stopped lamisil on 21, rash over entire body slight itching.    HPI:   Alexis Mcdonald [facility] a 62 y.o. female who presents for evaluation of diffuse rash covering 85% of body. Which a week ago on her abdomen.  The rash has spread to arms and legs but not face, palms soles of feet.  No recent tick bites. Mild itching, nu mucous membrane involvement or bleeding but throat doesn't feel quite right,  Denies trouble breathing or swallowing.  She has had no recent travel,  Overnight stays in hotel,  Or sick contacts.  Works as a Child psychotherapist .  Began taking oral lamisil in early June for treatment of toenail fungus, on June 3.  Stopped taking the lamisil one week after at my advice after e mailing me about the rash.  No fevers,  Or LN swelling       Past Medical History  Diagnosis Date  . Neutropenia     no infections,  bone marrow biopsy by [facility] R[facility] next week  . Colon polyp 2009    next one due 2012  . Borderline osteopenia     prior DEXA at [facility]   . renal calculi     s/p lithotripsy, [place]   . Thyroid disease     hypothyroidism  . Kidney stones   . 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    [name]  by March onc eval [facility])    Past Surgical History  Procedure Laterality Date  . Joint replacement  2005    done in [place]  . Dilation and curettage of uterus    . Right hip replacement    . Kidney stones removal         The following portions of the patient's history were reviewed and updated as appropriate: Allergies, current medications, and problem list.    Review of Systems:   12 Pt  review of systems was negative except those addressed in the HPI,     History   Social History  . Marital Status: Divorced    Spouse Name: N/A    Number of Children: 2  . Years of Education: N/A   Occupational History  . 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 Smoking status: Former Smoker    Types: Cigarettes    Quit date: 09/24/2000  . Smokeless tobacco: Never Used  . Alcohol Use: Yes  . Drug Use: No  . Sexually Active: Not Currently   Other Topics Concern  . Not on file   Social History Narrative   Patient lived most her her life in  [place] and moved to [facility] to help care for aging mother    Objective:  BP 128/78  Pulse 57  Temp(Src) 98.5 F (36.9 C) (Oral)  Resp 16  Wt 122 lb 12 oz (55.679 kg)  BMI 20.43 kg/m2  SpO2 99%  General appearance: alert, cooperative and appears stated age Ears: normal TM's and external ear canals both ears Throat: lips, mucosa, and tongue normal; teeth and gums normal Neck: no adenopathy, no carotid bruit, supple, symmetrical, trachea midline and thyroid not enlarged, symmetric, no tenderness/mass/nodules Back: symmetric, no curvature. ROM normal. No CVA tenderness. Lungs: clear to auscultation bilaterally Heart: regular rate and rhythm, S1, S2 normal, no murmur, click, rub or gallop Abdomen: soft, non-tender; bowel sounds normal; no masses,  no organomegaly Pulses: 2+ and symmetric Skin: Skin color,  texture, turgor normal. No rashes or lesions Lymph nodes: Cervical, supraclavicular, and axillary nodes normal.  Assessment and Plan:  Adverse drug reaction She has developed an exanthematous morbilliform drug reaction covering entire body, sparing face, palms and soles after starting lamisil. The medication was stopped n June 24.  Oral prednisone  60 mg daily x 4 followed by 10 mg daily taper,  Hydroxyzine oe itching and zyrtec.  Return or go to ED if fevers, mucocutaneous involvement occurs.     Updated Medication List Outpatient Encounter Prescriptions as of 06/29/2013  Medication Sig Dispense Refill  . ALPRAZolam (XANAX) 0.25 MG tablet Take 1 tablet (0.25 mg total) by mouth at bedtime as needed for sleep.  30 tablet  0  . aspirin 81 MG tablet Take 81 mg by mouth daily.      Marland Kitchen ibuprofen (ADVIL,MOTRIN) 200 MG tablet Take 200 mg by mouth as needed.        Marland Kitchen levothyroxine (SYNTHROID, LEVOTHROID) 50 MCG tablet TAKE 1 TABLET BY MOUTH DAILY.  30 tablet  3  . nicotine polacrilex (NICORETTE) 4 MG gum Take 2 mg by mouth as needed.       . psyllium (METAMUCIL) 58.6 % packet Take 1 packet by mouth daily.        Marland Kitchen CLINDAMYCIN HCL PO Take by mouth. For dental procedures       . fish oil-omega-3 fatty acids 1000 MG capsule Take 2 g by mouth daily.      . hydrOXYzine (ATARAX/VISTARIL) 25 MG tablet Take 1 tablet (25 mg total) by mouth 3 (three) times daily as needed for itching.  30 tablet  0  . predniSONE (DELTASONE) 10 MG tablet Take 6 tablets (60 mg total) by mouth daily. For 4 days, then begin 10 mg daily taper  39 tablet  0  . [DISCONTINUED] terbinafine (LAMISIL) 250 MG tablet Take 1 tablet (250 mg total) by mouth daily.  90 tablet  0   No facility-administered encounter medications on file as of 06/29/2013.     No orders of the defined types were placed in this encounter.    No Follow-up on file.

## 2013-06-29 NOTE — Telephone Encounter (Signed)
This patient we talked with last week with rash you said to stop Lamisil coming in this afternoon FYI

## 2013-07-01 ENCOUNTER — Encounter: Payer: Self-pay | Admitting: Internal Medicine

## 2013-07-01 NOTE — Assessment & Plan Note (Signed)
She has developed an exanthematous morbilliform drug reaction covering entire body, sparing face, palms and soles after starting lamisil. The medication was stopped n June 24.  Oral prednisone  60 mg daily x 4 followed by 10 mg daily taper,  Hydroxyzine oe itching and zyrtec.  Return or go to ED if fevers, mucocutaneous involvement occurs.

## 2013-07-10 LAB — HM COLONOSCOPY

## 2013-07-13 DIAGNOSIS — Z87442 Personal history of urinary calculi: Secondary | ICD-10-CM | POA: Insufficient documentation

## 2013-07-20 ENCOUNTER — Ambulatory Visit: Payer: Self-pay | Admitting: General Surgery

## 2013-07-20 ENCOUNTER — Other Ambulatory Visit: Payer: Self-pay | Admitting: General Surgery

## 2013-07-20 DIAGNOSIS — K5730 Diverticulosis of large intestine without perforation or abscess without bleeding: Secondary | ICD-10-CM

## 2013-07-20 DIAGNOSIS — Z86010 Personal history of colonic polyps: Secondary | ICD-10-CM

## 2013-07-22 ENCOUNTER — Other Ambulatory Visit: Payer: Self-pay | Admitting: Internal Medicine

## 2013-07-28 ENCOUNTER — Encounter: Payer: Self-pay | Admitting: General Surgery

## 2013-08-11 ENCOUNTER — Ambulatory Visit: Payer: Self-pay | Admitting: Internal Medicine

## 2013-08-12 ENCOUNTER — Ambulatory Visit: Payer: Self-pay | Admitting: Urology

## 2013-08-20 LAB — CBC CANCER CENTER
Basophil #: 0 x10 3/mm (ref 0.0–0.1)
Basophil %: 0.7 %
Eosinophil #: 0 x10 3/mm (ref 0.0–0.7)
Eosinophil %: 0.7 %
HCT: 38.1 % (ref 35.0–47.0)
HGB: 13.1 g/dL (ref 12.0–16.0)
Lymphocyte %: 33.2 %
MCHC: 34.3 g/dL (ref 32.0–36.0)
Monocyte #: 0.3 x10 3/mm (ref 0.2–0.9)
Monocyte %: 7.7 %
Neutrophil #: 2.4 x10 3/mm (ref 1.4–6.5)

## 2013-08-20 LAB — FERRITIN: Ferritin (ARMC): 38 ng/mL (ref 8–388)

## 2013-08-30 ENCOUNTER — Ambulatory Visit: Payer: Self-pay | Admitting: Internal Medicine

## 2013-11-04 ENCOUNTER — Other Ambulatory Visit: Payer: Self-pay

## 2013-12-13 DIAGNOSIS — C44211 Basal cell carcinoma of skin of unspecified ear and external auricular canal: Secondary | ICD-10-CM | POA: Insufficient documentation

## 2014-01-06 ENCOUNTER — Encounter: Payer: Self-pay | Admitting: Internal Medicine

## 2014-01-07 ENCOUNTER — Encounter: Payer: Self-pay | Admitting: Internal Medicine

## 2014-02-10 LAB — HM COLONOSCOPY

## 2014-02-17 ENCOUNTER — Ambulatory Visit: Payer: Self-pay | Admitting: Internal Medicine

## 2014-02-18 LAB — CBC CANCER CENTER
BASOS ABS: 0 x10 3/mm (ref 0.0–0.1)
BASOS PCT: 0.8 %
Eosinophil #: 0 x10 3/mm (ref 0.0–0.7)
Eosinophil %: 0.6 %
HCT: 40.1 % (ref 35.0–47.0)
HGB: 13.1 g/dL (ref 12.0–16.0)
LYMPHS PCT: 28.4 %
Lymphocyte #: 1.3 x10 3/mm (ref 1.0–3.6)
MCH: 30.4 pg (ref 26.0–34.0)
MCHC: 32.7 g/dL (ref 32.0–36.0)
MCV: 93 fL (ref 80–100)
MONO ABS: 0.3 x10 3/mm (ref 0.2–0.9)
Monocyte %: 6.8 %
NEUTROS ABS: 2.9 x10 3/mm (ref 1.4–6.5)
NEUTROS PCT: 63.4 %
Platelet: 151 x10 3/mm (ref 150–440)
RBC: 4.31 10*6/uL (ref 3.80–5.20)
RDW: 12.8 % (ref 11.5–14.5)
WBC: 4.5 x10 3/mm (ref 3.6–11.0)

## 2014-02-18 LAB — FERRITIN: Ferritin (ARMC): 26 ng/mL (ref 8–388)

## 2014-02-27 ENCOUNTER — Ambulatory Visit: Payer: Self-pay | Admitting: Internal Medicine

## 2014-05-10 ENCOUNTER — Ambulatory Visit (INDEPENDENT_AMBULATORY_CARE_PROVIDER_SITE_OTHER)
Admission: RE | Admit: 2014-05-10 | Discharge: 2014-05-10 | Disposition: A | Discharge status: Home / Self Care | Payer: BC Managed Care – HMO | Source: Ambulatory Visit | Attending: Internal Medicine | Admitting: Internal Medicine

## 2014-05-10 ENCOUNTER — Ambulatory Visit (INDEPENDENT_AMBULATORY_CARE_PROVIDER_SITE_OTHER): Payer: BC Managed Care – HMO | Admitting: Internal Medicine

## 2014-05-10 ENCOUNTER — Encounter: Payer: Self-pay | Admitting: Internal Medicine

## 2014-05-10 VITALS — BP 123/68 | HR 58 | Temp 98.1°F | Ht 64.25 in | Wt 120.0 lb

## 2014-05-10 DIAGNOSIS — M169 Osteoarthritis of hip, unspecified: Secondary | ICD-10-CM

## 2014-05-10 DIAGNOSIS — M7070 Other bursitis of hip, unspecified hip: Secondary | ICD-10-CM

## 2014-05-10 DIAGNOSIS — M76899 Other specified enthesopathies of unspecified lower limb, excluding foot: Secondary | ICD-10-CM

## 2014-05-10 DIAGNOSIS — M1610 Unilateral primary osteoarthritis, unspecified hip: Secondary | ICD-10-CM

## 2014-05-10 DIAGNOSIS — Z1239 Encounter for other screening for malignant neoplasm of breast: Secondary | ICD-10-CM

## 2014-05-10 DIAGNOSIS — E785 Hyperlipidemia, unspecified: Secondary | ICD-10-CM

## 2014-05-10 DIAGNOSIS — M949 Disorder of cartilage, unspecified: Secondary | ICD-10-CM

## 2014-05-10 DIAGNOSIS — R079 Chest pain, unspecified: Secondary | ICD-10-CM

## 2014-05-10 DIAGNOSIS — D126 Benign neoplasm of colon, unspecified: Secondary | ICD-10-CM

## 2014-05-10 DIAGNOSIS — Z0000 Encounter for general adult medical examination without abnormal findings: Secondary | ICD-10-CM

## 2014-05-10 DIAGNOSIS — M8580 Other specified disorders of bone density and structure, unspecified site: Secondary | ICD-10-CM

## 2014-05-10 DIAGNOSIS — Z124 Encounter for screening for malignant neoplasm of cervix: Secondary | ICD-10-CM

## 2014-05-10 DIAGNOSIS — M899 Disorder of bone, unspecified: Secondary | ICD-10-CM

## 2014-05-10 DIAGNOSIS — R5381 Other malaise: Secondary | ICD-10-CM

## 2014-05-10 DIAGNOSIS — R5383 Other fatigue: Secondary | ICD-10-CM

## 2014-05-10 DIAGNOSIS — E039 Hypothyroidism, unspecified: Secondary | ICD-10-CM

## 2014-05-10 NOTE — Progress Notes (Signed)
Pre visit review using our clinic review tool, if applicable. No additional management support is needed unless otherwise documented below in the visit note. 

## 2014-05-10 NOTE — Assessment & Plan Note (Signed)
Plain films of hip showed no loss of hardware or alignment.

## 2014-05-10 NOTE — Patient Instructions (Signed)
Referral for stress testing in process  Return for fasting labs at your convenience  Mammogram to be set up for you at [facility] to be one at [facility]

## 2014-05-10 NOTE — Assessment & Plan Note (Signed)
By prior DEXA ordered by Elisabeth Pigeon, done at [facility] , date unclear.  Records requested.  Her rfs included prior history of tobacco abuse and low body weight.  Repeat DEXA  Is ordered

## 2014-05-10 NOTE — Assessment & Plan Note (Signed)
Normal PAP 2014.  

## 2014-05-10 NOTE — Assessment & Plan Note (Signed)
Thyroid function is WNL on current dose.  No current changes needed.   Lab Results  Component Value Date   TSH 4.70 05/05/2013

## 2014-05-10 NOTE — Assessment & Plan Note (Signed)
Breasts are fibrocystic and desne on exam.  3D mammogram ordered

## 2014-05-10 NOTE — Progress Notes (Signed)
Patient ID: Alexis Mcdonald, female   DOB: August 13, 1951, 63 y.o.   MRN: [id number]   Annual  nongyn exam.  Had a Moh's procedure right ear by Anne Fu at [facility].  6 months ago   6 month follow up with derm Dasher no recurrence.   Arthritis in the lower back is occasoinally flaring .  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  Subjective:     Alexis Mcdonald is a 63 y.o. female and is here for a comprehensive physical exam. The patient reports problems - right hip pain.  History   Social History  . Marital Status: Divorced    Spouse Name: N/A    Number of Children: 2  . Years of Education: N/A   Occupational History  . 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 Smoking status: Former Smoker    Types: Cigarettes    Quit date: 09/24/2000  . Smokeless tobacco: Never Used  . Alcohol Use: Yes  . Drug Use: No  . Sexual Activity: Not Currently   Other Topics Concern  . Not on file   Social History Narrative   Patient lived most her her life in [place] and moved to [place] to help care for aging mother   Health Maintenance  Topic Date Due  . Zostavax  08/30/2010  . Influenza Vaccine  07/30/2014  . Mammogram  05/18/2015  . Pap Smear  05/07/2016  . Tetanus/tdap  05/08/2023  . Colonoscopy  07/21/2023    The following portions of the patient's history were reviewed and updated as appropriate: allergies, current medications, past family history, past medical history, past social history, past surgical history and problem list.  Review of Systems A comprehensive review of systems was negative.   Objective:  BP 123/68  Pulse 58  Temp(Src) 98.1 F (36.7 C) (Oral)  Ht 5' 4.25" (1.632 m)  Wt 120 lb (54.432  kg)  BMI 20.44 kg/m2  SpO2 100%  General appearance: alert, cooperative and appears stated age Head: Normocephalic, without obvious abnormality, atraumatic Eyes: conjunctivae/corneas clear. PERRL, EOM's intact. Fundi benign. Ears: normal TM's and external ear canals both ears Nose: Nares normal. Septum midline. Mucosa normal. No drainage or sinus tenderness. Throat: lips, mucosa, and tongue normal; teeth and gums normal Neck: no adenopathy, no carotid bruit, no JVD, supple, symmetrical, trachea midline and thyroid not enlarged, symmetric, no tenderness/mass/nodules Lungs: clear to auscultation bilaterally Breasts: normal appearance, no masses or tenderness Heart: regular rate and rhythm, S1, S2 normal, no murmur, click, rub or gallop Abdomen: soft, non-tender; bowel sounds normal; no masses,  no organomegaly Extremities: extremities normal, atraumatic, no cyanosis or edema Pulses: 2+ and symmetric Skin: Skin color, texture, turgor normal. No rashes or lesions Neurologic: Alert and oriented X 3, normal strength and tone. Normal symmetric reflexes. Normal coordination and gait.   Assessment and Plan:      Adenomatous polyp of colon Repeat colonoscopy was normal July 2014  Hypothyroidism Thyroid function is WNL on current dose.  No current changes needed.   Lab Results  Component Value Date   TSH 4.70 05/05/2013     Osteoarthritis of hip Plain films of hip showed no loss of hardware or alignment.  Borderline osteopenia By prior DEXA ordered by Elisabeth Pigeon, done at [facility] , date unclear.  Records requested.  Her rfs included prior history of tobacco abuse and low body weight.  Repeat DEXA  Is ordered    Screening for cervical cancer Normal PAP 2014  Screening for breast cancer Breasts are fibrocystic and dense on exam.  3D mammogram ordered   Updated Medication List Outpatient Encounter Prescriptions as of 05/10/2014  Medication Sig  . ALPRAZolam (XANAX)  0.25 MG tablet Take 1 tablet (0.25 mg total) by mouth at bedtime as needed for sleep.  Marland Kitchen aspirin 81 MG tablet Take 81 mg by mouth daily.  Marland Kitchen CLINDAMYCIN HCL PO Take by mouth. For dental procedures   . fish oil-omega-3 fatty acids 1000 MG capsule Take 2 g by mouth daily.  Marland Kitchen ibuprofen (ADVIL,MOTRIN) 200 MG tablet Take 200 mg by mouth as needed.    Marland Kitchen levothyroxine (SYNTHROID, LEVOTHROID) 50 MCG tablet TAKE 1 TABLET BY MOUTH DAILY.  . nicotine polacrilex (NICORETTE) 4 MG gum Take 2 mg by mouth as needed.   . psyllium (METAMUCIL) 58.6 % packet Take 1 packet by mouth daily.    . [DISCONTINUED] hydrOXYzine (ATARAX/VISTARIL) 25 MG tablet Take 1 tablet (25 mg total) by mouth 3 (three) times daily as needed for itching.  . [DISCONTINUED] predniSONE (DELTASONE) 10 MG tablet Take 6 tablets (60 mg total) by mouth daily. For 4 days, then begin 10 mg daily taper

## 2014-05-10 NOTE — Assessment & Plan Note (Signed)
Repeat colonoscopy was normal July 2014

## 2014-05-12 ENCOUNTER — Other Ambulatory Visit (INDEPENDENT_AMBULATORY_CARE_PROVIDER_SITE_OTHER): Payer: BC Managed Care – HMO

## 2014-05-12 DIAGNOSIS — R5381 Other malaise: Secondary | ICD-10-CM

## 2014-05-12 DIAGNOSIS — R5383 Other fatigue: Principal | ICD-10-CM

## 2014-05-12 DIAGNOSIS — M7070 Other bursitis of hip, unspecified hip: Secondary | ICD-10-CM

## 2014-05-12 DIAGNOSIS — E785 Hyperlipidemia, unspecified: Secondary | ICD-10-CM

## 2014-05-12 DIAGNOSIS — M76899 Other specified enthesopathies of unspecified lower limb, excluding foot: Secondary | ICD-10-CM

## 2014-05-12 LAB — CBC WITH DIFFERENTIAL/PLATELET
Basophils Absolute: 0 K/uL (ref 0.0–0.1)
Basophils Relative: 0.9 % (ref 0.0–3.0)
Eosinophils Absolute: 0 K/uL (ref 0.0–0.7)
Eosinophils Relative: 1.7 % (ref 0.0–5.0)
HCT: 37 % (ref 36.0–46.0)
Hemoglobin: 12.5 g/dL (ref 12.0–15.0)
Lymphocytes Relative: 45.9 % (ref 12.0–46.0)
Lymphs Abs: 1.1 K/uL (ref 0.7–4.0)
MCHC: 33.8 g/dL (ref 30.0–36.0)
MCV: 93.5 fl (ref 78.0–100.0)
Monocytes Absolute: 0.2 K/uL (ref 0.1–1.0)
Monocytes Relative: 9.3 % (ref 3.0–12.0)
Neutro Abs: 1 K/uL — ABNORMAL LOW (ref 1.4–7.7)
Neutrophils Relative %: 42.2 % — ABNORMAL LOW (ref 43.0–77.0)
Platelets: 154 K/uL (ref 150.0–400.0)
RBC: 3.96 Mil/uL (ref 3.87–5.11)
RDW: 13.8 % (ref 11.5–15.5)
WBC: 2.5 K/uL — ABNORMAL LOW (ref 4.0–10.5)

## 2014-05-12 LAB — COMPREHENSIVE METABOLIC PANEL
ALT: 11 U/L (ref 0–35)
AST: 19 U/L (ref 0–37)
Albumin: 3.8 g/dL (ref 3.5–5.2)
Alkaline Phosphatase: 55 U/L (ref 39–117)
BILIRUBIN TOTAL: 0.4 mg/dL (ref 0.2–1.2)
BUN: 28 mg/dL — ABNORMAL HIGH (ref 6–23)
CHLORIDE: 108 meq/L (ref 96–112)
CO2: 29 meq/L (ref 19–32)
Calcium: 9 mg/dL (ref 8.4–10.5)
Creatinine, Ser: 0.7 mg/dL (ref 0.4–1.2)
GFR: 92.68 mL/min (ref >60.00)
Glucose, Bld: 85 mg/dL (ref 70–99)
Potassium: 4.6 mEq/L (ref 3.5–5.1)
SODIUM: 140 meq/L (ref 135–145)
TOTAL PROTEIN: 6.1 g/dL (ref 6.0–8.3)

## 2014-05-12 LAB — SEDIMENTATION RATE: Sed Rate: 9 mm/h (ref 0–22)

## 2014-05-12 LAB — LIPID PANEL
Cholesterol: 199 mg/dL (ref 0–200)
HDL: 87 mg/dL
LDL Cholesterol: 101 mg/dL — ABNORMAL HIGH (ref 0–99)
Total CHOL/HDL Ratio: 2
Triglycerides: 55 mg/dL (ref 0.0–149.0)
VLDL: 11 mg/dL (ref 0.0–40.0)

## 2014-05-12 LAB — C-REACTIVE PROTEIN: CRP: <0.5 mg/dL (ref 0.5–20.0)

## 2014-05-12 LAB — TSH: TSH: 3.95 u[IU]/mL (ref 0.35–4.50)

## 2014-05-15 ENCOUNTER — Encounter: Payer: Self-pay | Admitting: Internal Medicine

## 2014-05-17 ENCOUNTER — Other Ambulatory Visit: Payer: Self-pay | Admitting: Internal Medicine

## 2014-05-18 NOTE — Telephone Encounter (Signed)
Faxed copy of labs to Dr. Ma Hillock as requested.

## 2014-05-24 ENCOUNTER — Ambulatory Visit
Admission: RE | Admit: 2014-05-24 | Discharge: 2014-05-24 | Disposition: A | Discharge status: Home / Self Care | Payer: BC Managed Care – HMO | Source: Ambulatory Visit | Attending: Internal Medicine | Admitting: Internal Medicine

## 2014-05-24 ENCOUNTER — Encounter: Payer: BC Managed Care – HMO | Admitting: Cardiovascular Disease

## 2014-05-24 DIAGNOSIS — Z1239 Encounter for other screening for malignant neoplasm of breast: Secondary | ICD-10-CM

## 2014-05-24 DIAGNOSIS — M8580 Other specified disorders of bone density and structure, unspecified site: Secondary | ICD-10-CM

## 2014-05-25 ENCOUNTER — Encounter: Payer: Self-pay | Admitting: Internal Medicine

## 2014-05-25 DIAGNOSIS — M810 Age-related osteoporosis without current pathological fracture: Secondary | ICD-10-CM | POA: Insufficient documentation

## 2014-05-25 DIAGNOSIS — M8580 Other specified disorders of bone density and structure, unspecified site: Secondary | ICD-10-CM | POA: Insufficient documentation

## 2014-05-27 LAB — HM MAMMOGRAPHY: HM Mammogram: NORMAL

## 2014-06-06 ENCOUNTER — Other Ambulatory Visit: Payer: Self-pay | Admitting: *Deleted

## 2014-06-06 NOTE — Telephone Encounter (Signed)
Pt left VM, requesting refill of Alprazolam, states she did not receive a Rx at her last Ov. Ok? Last visit 05/10/14

## 2014-06-07 MED ORDER — ALPRAZOLAM 0.25 MG PO TABS: 0.2500 mg | ORAL_TABLET | Freq: Every evening | ORAL | Status: DC | PRN

## 2014-06-28 ENCOUNTER — Encounter: Payer: Self-pay | Admitting: Cardiovascular Disease

## 2014-06-28 ENCOUNTER — Ambulatory Visit (INDEPENDENT_AMBULATORY_CARE_PROVIDER_SITE_OTHER): Payer: BC Managed Care – HMO | Admitting: Cardiovascular Disease

## 2014-06-28 VITALS — BP 108/80 | HR 53 | Ht 64.0 in | Wt 121.5 lb

## 2014-06-28 DIAGNOSIS — M79609 Pain in unspecified limb: Secondary | ICD-10-CM

## 2014-06-28 DIAGNOSIS — M79602 Pain in left arm: Secondary | ICD-10-CM

## 2014-06-28 DIAGNOSIS — M25519 Pain in unspecified shoulder: Secondary | ICD-10-CM

## 2014-06-28 DIAGNOSIS — M542 Cervicalgia: Secondary | ICD-10-CM

## 2014-06-28 NOTE — Patient Instructions (Signed)
We will call you with the results of your stress test.

## 2014-06-28 NOTE — Procedures (Signed)
Exercise Treadmill Test  Treadmill ordered for recent epsiodes of chest pain.  Resting EKG shows NSR with rate of 53 bpm, no significant ST or T wave changes Resting blood pressure of 123/78 Stand bruce protocal was used.  Patient exercised for 8 min 58 sec,  Peak heart rate of 134 bpm.  This was 85% of the maximum predicted heart rate of 157 bpm Achieved 10.1 METS No symptoms of chest pain or lightheadedness were reported at peak stress or in recovery.  Peak Blood pressure recorded was 173/68 Heart rate at 3 minutes in recovery was 62 bpm. No ST changes concerning for ischemia  FINAL IMPRESSION: Normal exercise stress test. No significant EKG changes concerning for ischemia. Excellent exercise tolerance.

## 2014-06-29 ENCOUNTER — Telehealth: Payer: Self-pay

## 2014-06-29 NOTE — Telephone Encounter (Signed)
Reviewed results of ETT w/ pt.  She verbalizes understanding and will call w/ further questions or concerns.

## 2014-07-10 ENCOUNTER — Encounter: Payer: Self-pay | Admitting: Internal Medicine

## 2014-07-10 DIAGNOSIS — M8580 Other specified disorders of bone density and structure, unspecified site: Secondary | ICD-10-CM

## 2014-07-10 LAB — HM DEXA SCAN

## 2014-07-10 NOTE — Assessment & Plan Note (Signed)
By prior DEXA ordered by Elisabeth Pigeon, done at [facility] , date unclear.  Records requested.  Her rfs included prior history of tobacco abuse and low body weight.  Repeat DEXA has been reviewed  And your risk of fracture is below the threshold for treatment.  Regular weight bearing exercise, calcium and vitamin d supplementation is advised.  I recommend getting the majority of  calcium and Vitamin D  through diet rather than supplements given the recent association of calcium supplements with increased coronary artery calcium scores (need 1200 mg daily )   Unsweetened almond/coconut milk is a great low calorie low carb, cholesterol free  way to increase your dietary calcium and vitamin D.  Try the blue Diamond  brand

## 2014-09-24 ENCOUNTER — Ambulatory Visit (INDEPENDENT_AMBULATORY_CARE_PROVIDER_SITE_OTHER): Payer: BC Managed Care – HMO

## 2014-09-24 DIAGNOSIS — Z23 Encounter for immunization: Secondary | ICD-10-CM

## 2015-01-30 ENCOUNTER — Encounter: Payer: Self-pay | Admitting: Internal Medicine

## 2015-02-09 DIAGNOSIS — Z96641 Presence of right artificial hip joint: Secondary | ICD-10-CM | POA: Insufficient documentation

## 2015-04-26 ENCOUNTER — Encounter: Payer: Self-pay | Admitting: Internal Medicine

## 2015-05-07 ENCOUNTER — Other Ambulatory Visit: Payer: Self-pay | Admitting: Internal Medicine

## 2015-05-31 LAB — HM MAMMOGRAPHY: HM Mammogram: NEGATIVE

## 2015-06-01 ENCOUNTER — Encounter: Payer: Self-pay | Admitting: *Deleted

## 2015-06-27 ENCOUNTER — Encounter: Payer: Self-pay | Admitting: Internal Medicine

## 2015-06-27 ENCOUNTER — Ambulatory Visit (INDEPENDENT_AMBULATORY_CARE_PROVIDER_SITE_OTHER): Payer: BLUE CROSS/BLUE SHIELD | Admitting: Internal Medicine

## 2015-06-27 VITALS — BP 104/72 | HR 56 | Temp 97.9°F | Resp 12 | Ht 64.25 in | Wt 117.2 lb

## 2015-06-27 DIAGNOSIS — E034 Atrophy of thyroid (acquired): Secondary | ICD-10-CM | POA: Diagnosis not present

## 2015-06-27 DIAGNOSIS — E038 Other specified hypothyroidism: Secondary | ICD-10-CM

## 2015-06-27 DIAGNOSIS — L089 Local infection of the skin and subcutaneous tissue, unspecified: Secondary | ICD-10-CM

## 2015-06-27 DIAGNOSIS — L0889 Other specified local infections of the skin and subcutaneous tissue: Secondary | ICD-10-CM | POA: Diagnosis not present

## 2015-06-27 DIAGNOSIS — R5383 Other fatigue: Secondary | ICD-10-CM

## 2015-06-27 LAB — CBC WITH DIFFERENTIAL/PLATELET
Basophils Absolute: 0 10*3/uL (ref 0.0–0.1)
Basophils Relative: 0.8 % (ref 0.0–3.0)
EOS ABS: 0 10*3/uL (ref 0.0–0.7)
Eosinophils Relative: 1.4 % (ref 0.0–5.0)
HCT: 40.6 % (ref 36.0–46.0)
Hemoglobin: 13.6 g/dL (ref 12.0–15.0)
Lymphocytes Relative: 38.3 % (ref 12.0–46.0)
Lymphs Abs: 1.2 10*3/uL (ref 0.7–4.0)
MCHC: 33.6 g/dL (ref 30.0–36.0)
MCV: 93.9 fl (ref 78.0–100.0)
MONO ABS: 0.3 10*3/uL (ref 0.1–1.0)
Monocytes Relative: 10.3 % (ref 3.0–12.0)
NEUTROS ABS: 1.6 10*3/uL (ref 1.4–7.7)
NEUTROS PCT: 49.2 % (ref 43.0–77.0)
Platelets: 159 10*3/uL (ref 150.0–400.0)
RBC: 4.33 Mil/uL (ref 3.87–5.11)
RDW: 13 % (ref 11.5–15.5)
WBC: 3.2 10*3/uL — ABNORMAL LOW (ref 4.0–10.5)

## 2015-06-27 LAB — COMPREHENSIVE METABOLIC PANEL
ALT: 11 U/L (ref 0–35)
AST: 18 U/L (ref 0–37)
Albumin: 4.4 g/dL (ref 3.5–5.2)
Alkaline Phosphatase: 66 U/L (ref 39–117)
BUN: 21 mg/dL (ref 6–23)
CHLORIDE: 105 meq/L (ref 96–112)
CO2: 29 mEq/L (ref 19–32)
CREATININE: 0.71 mg/dL (ref 0.40–1.20)
Calcium: 9.3 mg/dL (ref 8.4–10.5)
GFR: 87.86 mL/min (ref >60.00)
Glucose, Bld: 90 mg/dL (ref 70–99)
Potassium: 4.5 mEq/L (ref 3.5–5.1)
Sodium: 140 mEq/L (ref 135–145)
Total Bilirubin: 0.5 mg/dL (ref 0.2–1.2)
Total Protein: 6.7 g/dL (ref 6.0–8.3)

## 2015-06-27 LAB — TSH: TSH: 4.04 u[IU]/mL (ref 0.35–4.50)

## 2015-06-27 MED ORDER — CEPHALEXIN 500 MG PO CAPS: 500.0000 mg | ORAL_CAPSULE | Freq: Four times a day (QID) | ORAL | Status: DC

## 2015-06-27 NOTE — Assessment & Plan Note (Signed)
Empiric keflex for superficial cellulitis surrounding the bites.  Eradication of infestation recommended

## 2015-06-27 NOTE — Patient Instructions (Addendum)
I am prescribing you an antibiotic called keflex to take for 7 days to prevent infection from yBedbugs Bedbugs are tiny bugs that live in and around beds. During the day, they hide in mattresses and other places near beds. They come out at night and bite people lying in bed. They need blood to live and grow. Bedbugs can be found in beds anywhere. Usually, they are found in places where many people come and go (hotels, shelters, hospitals). It does not matter whether the place is dirty or clean. Getting bitten by bedbugs rarely causes a medical problem. The biggest problem can be getting rid of them. This often takes the work of a Financial risk analyst. CAUSES  Less use of pesticides. Bedbugs were common before the 1950s. Then, strong pesticides such as DDT nearly wiped them out. Today, these pesticides are not used because they harm the environment and can cause health problems.  More travel. Besides mattresses, bedbugs can also live in clothing and luggage. They can come along as people travel from place to place. Bedbugs are more common in certain parts of the world. When people travel to those areas, the bugs can come home with them.  Presence of birds and bats. Bedbugs often infest birds and bats. If you have these animals in or near your home, bedbugs may infest your house, too. SYMPTOMS It does not hurt to be bitten by a bedbug. You will probably not wake up when you are bitten. Bedbugs usually bite areas of the skin that are not covered. Symptoms may show when you wake up, or they may take a day or more to show up. Symptoms may include:  Small red bumps on the skin. These might be lined up in a row or clustered in a group.  A darker red dot in the middle of red bumps.  Blisters on the skin. There may be swelling and very bad itching. These may be signs of an allergic reaction. This does not happen often. DIAGNOSIS Bedbug bites might look and feel like other types of insect bites. The bugs do  not stay on the body like ticks or lice. They bite, drop off, and crawl away to hide. Your caregiver will probably:  Ask about your symptoms.  Ask about your recent activities and travel.  Check your skin for bedbug bites.  Ask you to check at home for signs of bedbugs. You should look for:  Spots or stains on the bed or nearby. This could be from bedbugs that were crushed or from their eggs or waste.  Bedbugs themselves. They are reddish-brown, oval, and flat. They do not fly. They are about the size of an apple seed.  Places to look for bedbugs include:  Beds. Check mattresses, headboards, box springs, and bed frames.  On drapes and curtains near the bed.  Under carpeting in the bedroom.  Behind electrical outlets.  Behind any wallpaper that is peeling.  Inside luggage. TREATMENT Most bedbug bites do not need treatment. They usually go away on their own in a few days. The bites are not dangerous. However, treatment may be needed if you have scratched so much that your skin has become infected. You may also need treatment if you are allergic to bedbug bites. Treatment options include:  A drug that stops swelling and itching (corticosteroid). Usually, a cream is rubbed on the skin. If you have a bad rash, you may be given a corticosteroid pill.  Oral antihistamines. These are pills to help  control itching.  Antibiotic medicines. An antibiotic may be prescribed for infected skin. HOME CARE INSTRUCTIONS   Take any medicine prescribed by your caregiver for your bites. Follow the directions carefully.  Consider wearing pajamas with long sleeves and pant legs.  Your bedroom may need to be treated. A pest control expert should make sure the bedbugs are gone. You may need to throw away mattresses or luggage. Ask the pest control expert what you can do to keep the bedbugs from coming back. Common suggestions include:  Putting a plastic cover over your mattress.  Washing and  drying your clothes and bedding in hot water and a hot dryer. The temperature should be hotter than 120 F (48.9 C). Bedbugs are killed by high temperatures.  Vacuuming carefully all around your bed. Vacuum in all cracks and crevices where the bugs might hide. Do this often.  Carefully checking all used furniture, bedding, or clothes that you bring into your house.  Eliminating bird nests and bat roosts.  If you get bedbug bites when traveling, check all your possessions carefully before bringing them into your house. If you find any bugs on clothes or in your luggage, consider throwing those items away. SEEK MEDICAL CARE IF:  You have red bug bites that keep coming back.  You have red bug bites that itch badly.  You have bug bites that cause a skin rash.  You have scratch marks that are red and sore. SEEK IMMEDIATE MEDICAL CARE IF: You have a fever. Document Released: 01/18/2011 Document Revised: 03/09/2012 Document Reviewed: 01/18/2011 [facility] Patient Information 2015 [place], [place]. This information is not intended to replace advice given to you by your health care provider. Make sure you discuss any questions you have with your health care provider. our bed bug bites

## 2015-06-27 NOTE — Progress Notes (Signed)
Pre-visit discussion using our clinic review tool. No additional management support is needed unless otherwise documented below in the visit note.  

## 2015-06-27 NOTE — Progress Notes (Signed)
Subjective:  Patient ID: Alexis Mcdonald, female    DOB: 1951/12/30  Age: 64 y.o. MRN: [id number]  CC: The primary encounter diagnosis was Bedbug bite with infection. Diagnoses of Hypothyroidism due to acquired atrophy of thyroid and Other fatigue were also pertinent to this visit.  HPI Alexis Mcdonald [facility] presents for evaluation of  Papular rash covering her trunk accompanied by redness and itching. .  Symptoms started on Friday after moving her king sized mattress .  The rash is primarily located on her abdomen.      Outpatient Prescriptions Prior to Visit  Medication Sig Dispense Refill  . ALPRAZolam (XANAX) 0.25 MG tablet Take 1 tablet (0.25 mg total) by mouth at bedtime as needed for sleep. 30 tablet 3  . aspirin 81 MG tablet Take 81 mg by mouth daily.    Marland Kitchen CLINDAMYCIN HCL PO Take by mouth. For dental procedures     . fish oil-omega-3 fatty acids 1000 MG capsule Take 2 g by mouth daily.    Marland Kitchen ibuprofen (ADVIL,MOTRIN) 200 MG tablet Take 200 mg by mouth as needed.      Marland Kitchen levothyroxine (SYNTHROID, LEVOTHROID) 50 MCG tablet TAKE 1 TABLET BY MOUTH EVERY DAY 30 tablet 11  . nicotine polacrilex (NICORETTE) 4 MG gum Take 2 mg by mouth as needed.     . psyllium (METAMUCIL) 58.6 % packet Take 1 packet by mouth daily.       No facility-administered medications prior to visit.    Review of Systems;  Patient denies headache, fevers, malaise, unintentional weight loss, skin rash, eye pain, sinus congestion and sinus pain, sore throat, dysphagia,  hemoptysis , cough, dyspnea, wheezing, chest pain, palpitations, orthopnea, edema, abdominal pain, nausea, melena, diarrhea, constipation, flank pain, dysuria, hematuria, urinary  Frequency, nocturia, numbness, tingling, seizures,  Focal weakness, Loss of consciousness,  Tremor, insomnia, depression, anxiety, and suicidal ideation.      Objective:  BP 104/72 mmHg  Pulse 56  Temp(Src) 97.9 F (36.6 C) (Oral)  Resp 12  Ht 5'  4.25" (1.632 m)  Wt 117 lb 4 oz (53.184 kg)  BMI 19.97 kg/m2  SpO2 100%  BP Readings from Last 3 Encounters:  06/27/15 104/72  06/28/14 108/80  05/10/14 123/68    Wt Readings from Last 3 Encounters:  06/27/15 117 lb 4 oz (53.184 kg)  06/28/14 121 lb 8 oz (55.112 kg)  05/10/14 120 lb (54.432 kg)    General appearance: alert, cooperative and appears stated age Throat: lips, mucosa, and tongue normal; teeth and gums normal Neck: no adenopathy, no carotid bruit, supple, symmetrical, trachea midline and thyroid not enlarged, symmetric, no tenderness/mass/nodules Back: symmetric, no curvature. ROM normal. No CVA tenderness. Lungs: clear to auscultation bilaterally Heart: regular rate and rhythm, S1, S2 normal, no murmur, click, rub or gallop Abdomen: soft, non-tender; bowel sounds normal; no masses,  no organomegaly Pulses: 2+ and symmetric Skin: clustered groups of red umbilicated papules on abdominal wall. Lymph nodes: Cervical, supraclavicular, and axillary nodes normal.  No results found for: HGBA1C  Lab Results  Component Value Date   CREATININE 0.71 06/27/2015   CREATININE 0.7 05/12/2014   CREATININE 0.6 05/05/2013    Lab Results  Component Value Date   WBC 3.2* 06/27/2015   HGB 13.6 06/27/2015   HCT 40.6 06/27/2015   PLT 159.0 06/27/2015   GLUCOSE 90 06/27/2015   CHOL 199 05/12/2014   TRIG 55.0 05/12/2014   HDL 87.00 05/12/2014   LDLCALC 101* 05/12/2014   ALT 11  06/27/2015   AST 18 06/27/2015   NA 140 06/27/2015   K 4.5 06/27/2015   CL 105 06/27/2015   CREATININE 0.71 06/27/2015   BUN 21 06/27/2015   CO2 29 06/27/2015   TSH 4.04 06/27/2015    Dg Bone Density  05/24/2014   CLINICAL DATA:  64 year old postmenopausal Caucasian female not currently taking bone building therapy.  EXAM: DUAL X-RAY ABSORPTIOMETRY (DXA) FOR BONE MINERAL DENSITY  FINDINGS: AP LUMBAR SPINE L1-L4  Bone Mineral Density (BMD):  0.982 g/cm2  Young Adult T-Score:  -0.6  Z-Score:  1.1   Left FEMUR neck  Bone Mineral Density (BMD):  0.630 g/cm2  Young Adult T-Score: -2.0  Z-Score:  -0.5  ASSESSMENT: Patient's diagnostic category is low bone mass by WHO Criteria.  FRACTURE RISK: Moderate  FRAX: Based on the [place], the 10 year probability of a major osteoporotic fracture is 9%. The 10 year probability of a hip fracture is 1.3%.  COMPARISON: None.  Effective therapies are available in the form of bisphosphonates, selective estrogen receptor modulators, biologic agents, and hormone replacement therapy (for women). All patients should ensure an adequate intake of dietary calcium (1200 mg daily) and vitamin D (800 IU daily) unless contraindicated.  All treatment decisions require clinical judgment and consideration of individual patient factors, including patient preferences, co-morbidities, previous drug use, risk factors not captured in the FRAX model (e.g., frailty, falls, vitamin D deficiency, increased bone turnover, interval significant decline in bone density) and possible under- or over-estimation of fracture risk by FRAX.  The National Osteoporosis Foundation recommends that FDA-approved medical therapies be considered in postmenopausal women and men age 28 or older with a:  1. Hip or vertebral (clinical or morphometric) fracture.  2. T-score of -2.5 or lower at the spine or hip.  3. Ten-year fracture probability by FRAX of 3% or greater for hip fracture or 20% or greater for major osteoporotic fracture.  People with diagnosed cases of osteoporosis or at high risk for fracture should have regular bone mineral density tests. For patients eligible for Medicare, routine testing is allowed once every 2 years. The testing frequency can be increased to one year for patients who have rapidly progressing disease, those who are receiving or discontinuing medical therapy to restore bone mass, or have additional risk factors.  World Pharmacologist [facility]) Criteria:  Normal:  T-scores from +1.0 to -1.0  Low Bone Mass (Osteopenia): T-scores between -1.0 and -2.5  Osteoporosis: T-scores -2.5 and below  Comparison to Reference Population:  T-score is the key measure used in the diagnosis of osteoporosis and relative risk determination for fracture. It provides a value for bone mass relative to the mean bone mass of a young adult reference population expressed in terms of standard deviation (SD).  Z-score is the age-matched score showing the patient's values compared to a population matched for age, sex, and race. This is also expressed in terms of standard deviation. The patient may have values that compare favorably to the age-matched values and still be at increased risk for fracture.   Electronically Signed   By: Duke Salvia M.D.   On: 05/24/2014 16:29   Mm Screening Breast Tomo Bilateral  05/27/2014   CLINICAL DATA:  Screening.  EXAM: DIGITAL SCREENING BILATERAL MAMMOGRAM WITH 3D TOMO WITH CAD  COMPARISON:  Previous exam(s).  ACR Breast Density Category c: The breast tissue is heterogeneously dense, which may obscure small masses.  FINDINGS: There are no findings suspicious for malignancy. Images  were processed with CAD.  IMPRESSION: No mammographic evidence of malignancy. A result letter of this screening mammogram will be mailed directly to the patient.  RECOMMENDATION: Screening mammogram in one year. (Code:SM-B-01Y)  BI-RADS CATEGORY  1: Negative.   Electronically Signed   By: Curlene Dolphin M.D.   On: 05/27/2014 16:54    Assessment & Plan:   Problem List Items Addressed This Visit      Unprioritized   Bedbug bite with infection - Primary    Empiric keflex for superficial cellulitis surrounding the bites.  Eradication of infestation recommended      Relevant Orders   CBC with Differential/Platelet (Completed)   Hypothyroidism   Relevant Orders   TSH (Completed)    Other Visit Diagnoses    Other fatigue        Relevant Orders    Comprehensive metabolic  panel (Completed)       I am having Alexis Mcdonald start on cephALEXin. I am also having her maintain her ibuprofen, CLINDAMYCIN HCL PO, psyllium, nicotine polacrilex, fish oil-omega-3 fatty acids, aspirin, ALPRAZolam, and levothyroxine.  Meds ordered this encounter  Medications  . cephALEXin (KEFLEX) 500 MG capsule    Sig: Take 1 capsule (500 mg total) by mouth 4 (four) times daily.    Dispense:  28 capsule    Refill:  0    There are no discontinued medications.  Follow-up: Return in about 4 weeks (around 07/25/2015).   [name], MD

## 2015-06-29 ENCOUNTER — Encounter: Payer: Self-pay | Admitting: Internal Medicine

## 2015-07-27 ENCOUNTER — Ambulatory Visit (INDEPENDENT_AMBULATORY_CARE_PROVIDER_SITE_OTHER): Payer: BLUE CROSS/BLUE SHIELD | Admitting: Internal Medicine

## 2015-07-27 ENCOUNTER — Encounter: Payer: Self-pay | Admitting: Internal Medicine

## 2015-07-27 VITALS — BP 110/70 | HR 49 | Temp 97.8°F | Resp 12 | Ht 64.5 in | Wt 116.5 lb

## 2015-07-27 DIAGNOSIS — Z0000 Encounter for general adult medical examination without abnormal findings: Secondary | ICD-10-CM | POA: Diagnosis not present

## 2015-07-27 DIAGNOSIS — D709 Neutropenia, unspecified: Secondary | ICD-10-CM

## 2015-07-27 DIAGNOSIS — E890 Postprocedural hypothyroidism: Secondary | ICD-10-CM

## 2015-07-27 DIAGNOSIS — C439 Malignant melanoma of skin, unspecified: Secondary | ICD-10-CM | POA: Insufficient documentation

## 2015-07-27 MED ORDER — ALPRAZOLAM 0.25 MG PO TABS: 0.2500 mg | ORAL_TABLET | Freq: Every evening | ORAL | Status: DC | PRN

## 2015-07-27 NOTE — Assessment & Plan Note (Signed)
Annual wellness  exam was done as well as a comprehensive physical exam .  During the course of the visit the patient was educated and counseled about appropriate screening and preventive services including :  diabetes screening, lipid analysis with projected  10 year  risk for CAD , nutrition counseling, colorectal cancer screening, and recommended immunizations.  Printed recommendations for health maintenance screenings was given.

## 2015-07-27 NOTE — Progress Notes (Signed)
Pre-visit discussion using our clinic review tool. No additional management support is needed unless otherwise documented below in the visit note.  

## 2015-07-27 NOTE — Progress Notes (Signed)
Patient ID: Alexis Mcdonald, female    DOB: [birth year]  Age: 64 y.o. MRN: [id number]  The patient is here for annual wellness examination and management of other chronic and acute problems. PAP atroph, HPV negative 2014  Normal mammogram June 2016 Normal colonoscopy,  a few sigmoid tics  2014 Alexis Mcdonald    The risk factors are reflected in the social history.  The roster of all physicians providing medical care to patient - is listed in the Snapshot section of the chart.  Activities of daily living:  The patient is 100% independent in all ADLs: dressing, toileting, feeding as well as independent mobility  Home safety : The patient has smoke detectors in the home. They wear seatbelts.  There are no firearms at home. There is no violence in the home.   There is no risks for hepatitis, STDs or HIV. There is no   history of blood transfusion. They have no travel history to infectious disease endemic areas of the world.  The patient has seen their dentist in the last six month. They have seen their eye doctor in the last year. They admit to slight hearing difficulty with regard to whispered voices and some television programs.  They have deferred audiologic testing in the last year.  They do not  have excessive sun exposure. Discussed the need for sun protection: hats, long sleeves and use of sunscreen if there is significant sun exposure.   Diet: the importance of a healthy diet is discussed. They do have a healthy diet.  The benefits of regular aerobic exercise were discussed. She walks 4 times per week ,  20 minutes.   Depression screen: there are no signs or vegative symptoms of depression- irritability, change in appetite, anhedonia, sadness/tearfullness.  Cognitive assessment: the patient manages all their financial and personal affairs and is actively engaged. They could relate day,date,year and events; recalled 2/3 objects at 3 minutes; performed clock-face test normally.  The  following portions of the patient's history were reviewed and updated as appropriate: allergies, current medications, past family history, past medical history,  past surgical history, past social history  and problem list.  Visual acuity was not assessed per patient preference since she has regular follow up with her ophthalmologist. Hearing and body mass index were assessed and reviewed.   During the course of the visit the patient was educated and counseled about appropriate screening and preventive services including : fall prevention , diabetes screening, nutrition counseling, colorectal cancer screening, and recommended immunizations.    CC: The primary encounter diagnosis was Melanoma of skin. Diagnoses of Routine general medical examination at a health care facility, Neutropenia, and Postoperative hypothyroidism were also pertinent to this visit.  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   She has past surgical history that includes Joint replacement (2005); Dilation and curettage of uterus; right hip replacement; and kidney stones removal.   Her family history includes Alcohol abuse in her brother, brother, and father; Diabetes in her brother and mother; Heart disease in her brother; Hyperlipidemia in her mother; Hypertension in her brother and mother; Stroke in her mother.She reports that she quit smoking about 14 years ago. Her smoking use included Cigarettes. She has never used smokeless tobacco. She reports that she drinks alcohol. She reports that she does not use illicit drugs.  Outpatient Prescriptions Prior to Visit  Medication Sig Dispense Refill  .  aspirin 81 MG tablet Take 81 mg by mouth daily.    . fish oil-omega-3 fatty acids 1000 MG capsule Take 2 g by mouth daily.    Marland Kitchen ibuprofen (ADVIL,MOTRIN) 200 MG tablet Take 200 mg by  mouth as needed.      Marland Kitchen levothyroxine (SYNTHROID, LEVOTHROID) 50 MCG tablet TAKE 1 TABLET BY MOUTH EVERY DAY 30 tablet 11  . nicotine polacrilex (NICORETTE) 4 MG gum Take 2 mg by mouth as needed.     . psyllium (METAMUCIL) 58.6 % packet Take 1 packet by mouth daily.      Marland Kitchen ALPRAZolam (XANAX) 0.25 MG tablet Take 1 tablet (0.25 mg total) by mouth at bedtime as needed for sleep. 30 tablet 3  . CLINDAMYCIN HCL PO Take by mouth. For dental procedures     . cephALEXin (KEFLEX) 500 MG capsule Take 1 capsule (500 mg total) by mouth 4 (four) times daily. (Patient not taking: Reported on 07/27/2015) 28 capsule 0   No facility-administered medications prior to visit.    Review of Systems   .Patient denies headache, fevers, malaise, unintentional weight loss, skin rash, eye pain, sinus congestion and sinus pain, sore throat, dysphagia,  hemoptysis , cough, dyspnea, wheezing, chest pain, palpitations, orthopnea, edema, abdominal pain, nausea, melena, diarrhea, constipation, flank pain, dysuria, hematuria, urinary  Frequency, nocturia, numbness, tingling, seizures,  Focal weakness, Loss of consciousness,  Tremor, insomnia, depression, anxiety, and suicidal ideation.      Objective:  BP 110/70 mmHg  Pulse 49  Temp(Src) 97.8 F (36.6 C) (Oral)  Resp 12  Ht 5' 4.5" (1.638 m)  Wt 116 lb 8 oz (52.844 kg)  BMI 19.70 kg/m2  SpO2 99%  Physical Exam  General appearance: alert, cooperative and appears stated age Head: Normocephalic, without obvious abnormality, atraumatic Eyes: conjunctivae/corneas clear. PERRL, EOM's intact. Fundi benign. Ears: normal TM's and external ear canals both ears Nose: Nares normal. Septum midline. Mucosa normal. No drainage or sinus tenderness. Throat: lips, mucosa, and tongue normal; teeth and gums normal Neck: no adenopathy, no carotid bruit, no JVD, supple, symmetrical, trachea midline and thyroid not enlarged, symmetric, no tenderness/mass/nodules Lungs: clear to  auscultation bilaterally Breasts: normal appearance, no masses or tenderness Heart: regular rate and rhythm, S1, S2 normal, no murmur, click, rub or gallop Abdomen: soft, non-tender; bowel sounds normal; no masses,  no organomegaly Extremities: extremities normal, atraumatic, no cyanosis or edema Pulses: 2+ and symmetric Skin: Skin color, texture, turgor normal. No rashes or lesions Neurologic: Alert and oriented X 3, normal strength and tone. Normal symmetric reflexes. Normal coordination and gait.   Assessment & Plan:   Problem List Items Addressed This Visit      Unprioritized   Neutropenia    With thrombocytopenia,  PreviWously folloed by [facility].  Workup for viral hepatitis, autoimmune disorders, MM and HH done in 2012 by [facility] and only a single mutation for [phone] for [facility] was found. WBC and plts are stable per repeat check here.    Lab Results  Component Value Date   WBC 3.2* 06/27/2015   HGB 13.6 06/27/2015   HCT 40.6 06/27/2015   MCV 93.9 06/27/2015   PLT 159.0 06/27/2015         Hypothyroidism    Thyroid function is WNL on current dose.  No current changes needed.   Lab Results  Component Value Date   TSH 4.04 06/27/2015         Melanoma of skin - Primary    Right hip,  Found by Das[facility] and removed.  April 2016        Relevant Medications   ALPRAZolam (XANAX) 0.25 MG tablet   Routine general medical examination at a health care facility    Annual wellness  exam was done as well as a comprehensive physical exam .  During the course of the visit the patient was educated and counseled about appropriate screening and preventive services including :  diabetes screening, lipid analysis with projected  10 year  risk for CAD , nutrition counseling, colorectal cancer screening, and recommended immunizations.  Printed recommendations for health maintenance screenings was given.           I have discontinued [name]'s cephALEXin. I am also having  her maintain her ibuprofen, CLINDAMYCIN HCL PO, psyllium, nicotine polacrilex, fish oil-omega-3 fatty acids, aspirin, levothyroxine, and ALPRAZolam.  Meds ordered this encounter  Medications  . ALPRAZolam (XANAX) 0.25 MG tablet    Sig: Take 1 tablet (0.25 mg total) by mouth at bedtime as needed for sleep.    Dispense:  30 tablet    Refill:  3    Medications Discontinued During This Encounter  Medication Reason  . cephALEXin (KEFLEX) 500 MG capsule Completed Course  . ALPRAZolam (XANAX) 0.25 MG tablet Reorder    Follow-up: No Follow-up on file.   [name], MD

## 2015-07-27 NOTE — Patient Instructions (Signed)

## 2015-07-29 NOTE — Assessment & Plan Note (Signed)
Right hip,  Found by Dasher and removed.  April 2016

## 2015-07-29 NOTE — Assessment & Plan Note (Signed)
With thrombocytopenia,  PreviWously folloed by [facility] center.  Workup for viral hepatitis, autoimmune disorders, MM and HH done in 2012 by [facility] and only a single mutation for [phone] for [facility] was found. WBC and plts are stable per repeat check here.    Lab Results  Component Value Date   WBC 3.2* 06/27/2015   HGB 13.6 06/27/2015   HCT 40.6 06/27/2015   MCV 93.9 06/27/2015   PLT 159.0 06/27/2015

## 2015-07-29 NOTE — Assessment & Plan Note (Signed)
Thyroid function is WNL on current dose.  No current changes needed.   Lab Results  Component Value Date   TSH 4.04 06/27/2015

## 2015-09-07 DIAGNOSIS — F341 Dysthymic disorder: Secondary | ICD-10-CM | POA: Diagnosis not present

## 2015-09-20 DIAGNOSIS — Z85828 Personal history of other malignant neoplasm of skin: Secondary | ICD-10-CM | POA: Diagnosis not present

## 2015-09-20 DIAGNOSIS — Z85820 Personal history of malignant melanoma of skin: Secondary | ICD-10-CM | POA: Diagnosis not present

## 2015-09-20 DIAGNOSIS — D2272 Melanocytic nevi of left lower limb, including hip: Secondary | ICD-10-CM | POA: Diagnosis not present

## 2015-09-20 DIAGNOSIS — L814 Other melanin hyperpigmentation: Secondary | ICD-10-CM | POA: Diagnosis not present

## 2015-09-20 DIAGNOSIS — L570 Actinic keratosis: Secondary | ICD-10-CM | POA: Diagnosis not present

## 2015-09-20 DIAGNOSIS — D485 Neoplasm of uncertain behavior of skin: Secondary | ICD-10-CM | POA: Diagnosis not present

## 2015-09-20 DIAGNOSIS — D2271 Melanocytic nevi of right lower limb, including hip: Secondary | ICD-10-CM | POA: Diagnosis not present

## 2015-10-10 ENCOUNTER — Ambulatory Visit (INDEPENDENT_AMBULATORY_CARE_PROVIDER_SITE_OTHER): Payer: Medicare Other | Admitting: *Deleted

## 2015-10-10 DIAGNOSIS — Z23 Encounter for immunization: Secondary | ICD-10-CM | POA: Diagnosis not present

## 2015-10-24 DIAGNOSIS — L570 Actinic keratosis: Secondary | ICD-10-CM | POA: Diagnosis not present

## 2015-12-02 ENCOUNTER — Encounter: Payer: Self-pay | Admitting: Internal Medicine

## 2015-12-04 ENCOUNTER — Encounter: Payer: Self-pay | Admitting: Family Medicine

## 2015-12-04 ENCOUNTER — Ambulatory Visit (INDEPENDENT_AMBULATORY_CARE_PROVIDER_SITE_OTHER): Payer: Medicare Other | Admitting: Family Medicine

## 2015-12-04 VITALS — BP 104/72 | HR 60 | Temp 98.6°F | Ht 64.5 in | Wt 124.0 lb

## 2015-12-04 DIAGNOSIS — B9789 Other viral agents as the cause of diseases classified elsewhere: Secondary | ICD-10-CM

## 2015-12-04 DIAGNOSIS — J988 Other specified respiratory disorders: Principal | ICD-10-CM

## 2015-12-04 DIAGNOSIS — J069 Acute upper respiratory infection, unspecified: Secondary | ICD-10-CM | POA: Insufficient documentation

## 2015-12-04 DIAGNOSIS — B349 Viral infection, unspecified: Secondary | ICD-10-CM

## 2015-12-04 MED ORDER — PREDNISONE 50 MG PO TABS: ORAL_TABLET | ORAL | Status: DC

## 2015-12-04 NOTE — Patient Instructions (Addendum)
It was nice to see you today.  Take the prednisone as prescribed.  He continues Flonase, nettipot for your symptoms.  Call or send a message through mychart if you fail to improve in next few days.  Follow up:  Return if symptoms worsen or fail to improve.  Take care  Dr. Lacinda Axon

## 2015-12-04 NOTE — Progress Notes (Signed)
Pre visit review using our clinic review tool, if applicable. No additional management support is needed unless otherwise documented below in the visit note. 

## 2015-12-04 NOTE — Assessment & Plan Note (Addendum)
New problem. Exam was benign today. Treating with continued supportive care at home with use of flonase, Nettie pot, OTC Alka-seltzer cold. Adding prednisone today for the cough. Rx sent.

## 2015-12-04 NOTE — Progress Notes (Signed)
Subjective:  Patient ID: Alexis Mcdonald, female    DOB: Sep 03, 1951  Age: 64 y.o. MRN: [id number]  CC: Sinus pressure/congestion, Cough  HPI:  64 year old female presents to clinic today for an acute visit with the above complaints.  Patient reports a one-week history of sinus pressure/pain, nasal congestion, and productive cough.  She reports associated "mild" fever. She's been using a nettie pot and Alka-Seltzer remedy with some improvement. No known exacerbating factors. Cough is mildly productive of discolored sputum. Additionally, she reports associated headache which is mild.  Social Hx   Social History   Social History  . Marital Status: Divorced    Spouse Name: N/A  . Number of Children: 2  . Years of Education: N/A   Occupational History  . 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 Smoking status: Former Smoker    Types: Cigarettes    Quit date: 09/24/2000  . Smokeless tobacco: Never Used  . Alcohol Use: Yes  . Drug Use: No  . Sexual Activity: Not Currently   Other Topics Concern  . None   Social History Narrative   Patient lived most her her life in [place] and moved to [place] to help care for aging mother    Review of Systems  Constitutional: Positive for fatigue. Negative for chills.       Reported "mild" fever.   HENT: Positive for congestion, ear pain and sinus pressure.   Respiratory: Positive for cough.    Objective:  BP 104/72 mmHg  Pulse 60  Temp(Src) 98.6 F (37 C) (Oral)  Ht 5' 4.5" (1.638 m)  Wt 124 lb (56.246 kg)  BMI 20.96 kg/m2  SpO2 98%  BP/Weight 12/04/2015 07/27/2015 [id number]  Systolic BP 123456 [id number]  Diastolic BP 72 70 72  Wt. (Lbs) 124 116.5 117.25  BMI 20.96 19.7 19.97    Physical Exam  Constitutional: She appears well-developed. No distress.  Appears fatigued  HENT:  Head: Normocephalic and atraumatic.  Right Ear: External ear normal.  Left Ear: External ear normal.  Mouth/Throat:  Oropharynx is clear and moist.  Normal TM's bilaterally.   Eyes: Conjunctivae are normal. Right eye exhibits no discharge. Left eye exhibits no discharge.  Neck: Neck supple.  Cardiovascular: Normal rate and regular rhythm.   Pulmonary/Chest: Effort normal and breath sounds normal.  Lymphadenopathy:    She has no cervical adenopathy.  Neurological: She is alert.  Psychiatric: She has a normal mood and affect.  Vitals reviewed.  Lab Results  Component Value Date   WBC 3.2* 06/27/2015   HGB 13.6 06/27/2015   HCT 40.6 06/27/2015   PLT 159.0 06/27/2015   GLUCOSE 90 06/27/2015   CHOL 199 05/12/2014   TRIG 55.0 05/12/2014   HDL 87.00 05/12/2014   LDLCALC 101* 05/12/2014   ALT 11 06/27/2015   AST 18 06/27/2015   NA 140 06/27/2015   K 4.5 06/27/2015   CL 105 06/27/2015   CREATININE 0.71 06/27/2015   BUN 21 06/27/2015   CO2 29 06/27/2015   TSH 4.04 06/27/2015    Assessment & Plan:   Problem List Items Addressed This Visit    Viral respiratory infection - Primary    New problem. Exam was benign today. Treating with continued supportive care at home with use of flonase, Nettie pot, OTC Alka-seltzer cold. Adding prednisone today for the cough. Rx sent.         Meds ordered this encounter  Medications  . predniSONE (DELTASONE) 50  MG tablet    Sig: 1 tablet daily for 5 days.    Dispense:  5 tablet    Refill:  0    Follow-up: Return if symptoms worsen or fail to improve.  [place]

## 2015-12-06 ENCOUNTER — Encounter: Payer: Self-pay | Admitting: Family Medicine

## 2015-12-14 DIAGNOSIS — F341 Dysthymic disorder: Secondary | ICD-10-CM | POA: Diagnosis not present

## 2016-01-11 DIAGNOSIS — F341 Dysthymic disorder: Secondary | ICD-10-CM | POA: Diagnosis not present

## 2016-02-08 DIAGNOSIS — F341 Dysthymic disorder: Secondary | ICD-10-CM | POA: Diagnosis not present

## 2016-03-14 DIAGNOSIS — F341 Dysthymic disorder: Secondary | ICD-10-CM | POA: Diagnosis not present

## 2016-04-08 ENCOUNTER — Other Ambulatory Visit: Payer: Self-pay | Admitting: Internal Medicine

## 2016-04-11 ENCOUNTER — Encounter: Payer: Self-pay | Admitting: Family Medicine

## 2016-04-11 ENCOUNTER — Encounter: Payer: Self-pay | Admitting: Internal Medicine

## 2016-04-11 ENCOUNTER — Ambulatory Visit (INDEPENDENT_AMBULATORY_CARE_PROVIDER_SITE_OTHER): Payer: Medicare Other | Admitting: Family Medicine

## 2016-04-11 VITALS — BP 130/82 | HR 63 | Temp 98.3°F | Ht 64.5 in | Wt 118.8 lb

## 2016-04-11 DIAGNOSIS — H9312 Tinnitus, left ear: Secondary | ICD-10-CM

## 2016-04-11 NOTE — Progress Notes (Signed)
Pre visit review using our clinic review tool, if applicable. No additional management support is needed unless otherwise documented below in the visit note. 

## 2016-04-11 NOTE — Patient Instructions (Signed)
Nice to meet you. We're going to refer you to ENT for further evaluation of the ringing in your ears. You can try Flonase to see if this will help decrease the inflammation in your eustachian tubes. If you develop dizziness, fevers, headaches, numbness, weakness, or any new or changing symptoms please seek medical attention.

## 2016-04-12 ENCOUNTER — Encounter: Payer: Self-pay | Admitting: Family Medicine

## 2016-04-14 DIAGNOSIS — H9312 Tinnitus, left ear: Secondary | ICD-10-CM | POA: Insufficient documentation

## 2016-04-14 NOTE — Progress Notes (Signed)
Patient ID: Alexis Mcdonald, female   DOB: [birth year], 65 y.o.   MRN: [id number]  Tommi Rumps, MD Phone: [phone]  Alexis Mcdonald is a 65 y.o. female who presents today for same-day visit.  Patient reports tinnitus in her left ear for the last 2 weeks. States it is been constant. Is a buzzing and high-pitched noise. She denies hearing loss, dizziness, headaches, numbness, and weakness. She is unsure if the left ear feels different than the right ear with regards to fullness. She does know she swims a lot and puts earplugs in her years when she swims. She has had tinnitus intermittently in the past. She has noted some allergies with rhinorrhea and postnasal drip that is somewhat relieved recently. She has even stopped her aspirin as she felt this might be contributing and it has not helped.  PMH: Former smoker ROS see history of present illness  Objective  Physical Exam Filed Vitals:   04/11/16 1613  BP: 130/82  Pulse: 63  Temp: 98.3 F (36.8 C)    BP Readings from Last 3 Encounters:  04/11/16 130/82  12/04/15 104/72  07/27/15 110/70   Wt Readings from Last 3 Encounters:  04/11/16 118 lb 12.8 oz (53.887 kg)  12/04/15 124 lb (56.246 kg)  07/27/15 116 lb 8 oz (52.844 kg)    Physical Exam  Constitutional: She is well-developed, well-nourished, and in no distress.  HENT:  Head: Normocephalic and atraumatic.  Right Ear: External ear normal.  Left Ear: External ear normal.  Mouth/Throat: Oropharynx is clear and moist. No oropharyngeal exudate.  Left TM with decreased light reflex, no purulent material behind the TM, no disruption of the TM, no erythema of the TM Right TM normal  Eyes: Conjunctivae are normal. Pupils are equal, round, and reactive to light.  Neck: Neck supple.  Cardiovascular: Normal rate, regular rhythm and normal heart sounds.   Pulmonary/Chest: Effort normal.  Lymphadenopathy:    She has no cervical adenopathy.    Neurological:  Decreased hearing to finger rub on the left, otherwise CN 2-12 intact, 5/5 strength in bilateral biceps, triceps, grip, quads, hamstrings, plantar and dorsiflexion, sensation to light touch intact in bilateral UE and LE, normal gait, 2+ patellar reflexes  Skin: She is not diaphoretic.     Assessment/Plan: Please see individual problem list.  Tinnitus of left ear Several weeks of tinnitus. Mildly decreased light reflex on the left could argue for fluid in the middle ear. Not purulent. She has had some allergies recently that could be contributing. Given the persistence and possible fullness I advised referral to ENT. Advised that she restart her aspirin as this does not appear to have been contributing. She can start Flonase over-the-counter as well. Patient does report she is traveling soon and I did discuss that the full sensation may worsen if she does have congestion in that ear. Discussed potential for prednisone to help decrease inflammation though she wanted to hold off on this at this time. She will call if she wants to reconsider this. She'll continue to monitor. Given return precautions.    Orders Placed This Encounter  Procedures  . Ambulatory referral to ENT    Referral Priority:  Routine    Referral Type:  Consultation    Referral Reason:  Specialty Services Required    Requested Specialty:  Otolaryngology    Number of Visits Requested:  [place], MD [place]

## 2016-04-14 NOTE — Assessment & Plan Note (Addendum)
Several weeks of tinnitus. Mildly decreased light reflex on the left could argue for fluid in the middle ear. Not purulent. She has had some allergies recently that could be contributing. Given the persistence and possible fullness I advised referral to ENT. Advised that she restart her aspirin as this does not appear to have been contributing. She can start Flonase over-the-counter as well. Patient does report she is traveling soon and I did discuss that the full sensation may worsen if she does have congestion in that ear. Discussed potential for prednisone to help decrease inflammation though she wanted to hold off on this at this time. She will call if she wants to reconsider this. She'll continue to monitor. Given return precautions.

## 2016-05-06 DIAGNOSIS — X32XXXA Exposure to sunlight, initial encounter: Secondary | ICD-10-CM | POA: Diagnosis not present

## 2016-05-06 DIAGNOSIS — L570 Actinic keratosis: Secondary | ICD-10-CM | POA: Diagnosis not present

## 2016-05-06 DIAGNOSIS — Z85828 Personal history of other malignant neoplasm of skin: Secondary | ICD-10-CM | POA: Diagnosis not present

## 2016-05-06 DIAGNOSIS — D225 Melanocytic nevi of trunk: Secondary | ICD-10-CM | POA: Diagnosis not present

## 2016-05-06 DIAGNOSIS — D2272 Melanocytic nevi of left lower limb, including hip: Secondary | ICD-10-CM | POA: Diagnosis not present

## 2016-05-06 DIAGNOSIS — Z85820 Personal history of malignant melanoma of skin: Secondary | ICD-10-CM | POA: Diagnosis not present

## 2016-05-07 DIAGNOSIS — H903 Sensorineural hearing loss, bilateral: Secondary | ICD-10-CM | POA: Diagnosis not present

## 2016-05-07 DIAGNOSIS — H9122 Sudden idiopathic hearing loss, left ear: Secondary | ICD-10-CM | POA: Diagnosis not present

## 2016-05-07 DIAGNOSIS — H9312 Tinnitus, left ear: Secondary | ICD-10-CM | POA: Diagnosis not present

## 2016-05-09 DIAGNOSIS — F341 Dysthymic disorder: Secondary | ICD-10-CM | POA: Diagnosis not present

## 2016-05-15 ENCOUNTER — Other Ambulatory Visit: Payer: Self-pay | Admitting: Otolaryngology

## 2016-05-15 DIAGNOSIS — H9312 Tinnitus, left ear: Secondary | ICD-10-CM | POA: Diagnosis not present

## 2016-05-15 DIAGNOSIS — H903 Sensorineural hearing loss, bilateral: Secondary | ICD-10-CM | POA: Diagnosis not present

## 2016-05-15 DIAGNOSIS — H9122 Sudden idiopathic hearing loss, left ear: Secondary | ICD-10-CM | POA: Diagnosis not present

## 2016-05-20 ENCOUNTER — Encounter: Payer: Self-pay | Admitting: Internal Medicine

## 2016-05-20 DIAGNOSIS — Z148 Genetic carrier of other disease: Secondary | ICD-10-CM

## 2016-05-20 DIAGNOSIS — R5383 Other fatigue: Secondary | ICD-10-CM

## 2016-05-20 DIAGNOSIS — E559 Vitamin D deficiency, unspecified: Secondary | ICD-10-CM

## 2016-05-20 DIAGNOSIS — E785 Hyperlipidemia, unspecified: Secondary | ICD-10-CM

## 2016-05-20 DIAGNOSIS — E890 Postprocedural hypothyroidism: Secondary | ICD-10-CM

## 2016-05-20 DIAGNOSIS — D696 Thrombocytopenia, unspecified: Secondary | ICD-10-CM

## 2016-05-20 NOTE — Telephone Encounter (Signed)
Patient requesting B 12 level , Iron and TSH i have put labs in anything else for CPE in July?

## 2016-05-20 NOTE — Telephone Encounter (Signed)
Fasting labs ordered

## 2016-06-05 DIAGNOSIS — H2513 Age-related nuclear cataract, bilateral: Secondary | ICD-10-CM | POA: Diagnosis not present

## 2016-06-06 ENCOUNTER — Ambulatory Visit
Admission: RE | Admit: 2016-06-06 | Discharge: 2016-06-06 | Disposition: A | Discharge status: Home / Self Care | Payer: Medicare Other | Source: Ambulatory Visit | Attending: Otolaryngology | Admitting: Otolaryngology

## 2016-06-06 DIAGNOSIS — H9312 Tinnitus, left ear: Secondary | ICD-10-CM | POA: Diagnosis not present

## 2016-06-06 DIAGNOSIS — H9319 Tinnitus, unspecified ear: Secondary | ICD-10-CM | POA: Diagnosis not present

## 2016-06-06 LAB — POCT I-STAT CREATININE: Creatinine, Ser: 0.6 mg/dL (ref 0.44–1.00)

## 2016-06-06 MED ORDER — GADOBENATE DIMEGLUMINE 529 MG/ML IV SOLN
10.0000 mL | Freq: Once | INTRAVENOUS | Status: AC | PRN
  Administered 2016-06-06: 10 mL via INTRAVENOUS

## 2016-06-27 DIAGNOSIS — F341 Dysthymic disorder: Secondary | ICD-10-CM | POA: Diagnosis not present

## 2016-06-29 HISTORY — PX: MELANOMA EXCISION: SHX5266

## 2016-07-03 ENCOUNTER — Other Ambulatory Visit: Payer: Self-pay | Admitting: Internal Medicine

## 2016-08-08 DIAGNOSIS — F341 Dysthymic disorder: Secondary | ICD-10-CM | POA: Diagnosis not present

## 2016-08-20 ENCOUNTER — Other Ambulatory Visit (HOSPITAL_COMMUNITY)
Admission: RE | Admit: 2016-08-20 | Discharge: 2016-08-20 | Disposition: A | Discharge status: Home / Self Care | Payer: Medicare Other | Source: Ambulatory Visit | Attending: Internal Medicine | Admitting: Internal Medicine

## 2016-08-20 ENCOUNTER — Ambulatory Visit (INDEPENDENT_AMBULATORY_CARE_PROVIDER_SITE_OTHER): Payer: Medicare Other | Admitting: Internal Medicine

## 2016-08-20 ENCOUNTER — Encounter: Payer: Self-pay | Admitting: Internal Medicine

## 2016-08-20 VITALS — BP 108/64 | HR 61 | Temp 98.3°F | Resp 12 | Ht 65.0 in | Wt 120.5 lb

## 2016-08-20 DIAGNOSIS — E785 Hyperlipidemia, unspecified: Secondary | ICD-10-CM | POA: Diagnosis not present

## 2016-08-20 DIAGNOSIS — R002 Palpitations: Secondary | ICD-10-CM | POA: Diagnosis not present

## 2016-08-20 DIAGNOSIS — Z124 Encounter for screening for malignant neoplasm of cervix: Secondary | ICD-10-CM | POA: Diagnosis not present

## 2016-08-20 DIAGNOSIS — E038 Other specified hypothyroidism: Secondary | ICD-10-CM

## 2016-08-20 DIAGNOSIS — Z1151 Encounter for screening for human papillomavirus (HPV): Secondary | ICD-10-CM | POA: Diagnosis not present

## 2016-08-20 DIAGNOSIS — Z01419 Encounter for gynecological examination (general) (routine) without abnormal findings: Secondary | ICD-10-CM | POA: Insufficient documentation

## 2016-08-20 DIAGNOSIS — E559 Vitamin D deficiency, unspecified: Secondary | ICD-10-CM

## 2016-08-20 DIAGNOSIS — Z23 Encounter for immunization: Secondary | ICD-10-CM

## 2016-08-20 DIAGNOSIS — Z1239 Encounter for other screening for malignant neoplasm of breast: Secondary | ICD-10-CM

## 2016-08-20 DIAGNOSIS — Z0000 Encounter for general adult medical examination without abnormal findings: Secondary | ICD-10-CM

## 2016-08-20 MED ORDER — ALPRAZOLAM 0.25 MG PO TABS: 0.2500 mg | ORAL_TABLET | Freq: Every evening | ORAL | 3 refills | Status: DC | PRN

## 2016-08-20 NOTE — Progress Notes (Signed)
Pre-visit discussion using our clinic review tool. No additional management support is needed unless otherwise documented below in the visit note.  

## 2016-08-20 NOTE — Patient Instructions (Signed)

## 2016-08-20 NOTE — Progress Notes (Signed)
Patient ID: Alexis Mcdonald, female    DOB: 1951-01-24  Age: 65 y.o. MRN: [id number]  The patient is here for welcome to  Medicare wellness examination and management of other chronic and acute problems.    Normal PAP 2014   The risk factors are reflected in the social history.  The roster of all physicians providing medical care to patient - is listed in the Snapshot section of the chart.  Activities of daily living:  The patient is 100% independent in all ADLs: dressing, toileting, feeding as well as independent mobility  Home safety : The patient has smoke detectors in the home. They wear seatbelts.  There are no firearms at home. There is no violence in the home.   There is no risks for hepatitis, STDs or HIV. There is no   history of blood transfusion. They have no travel history to infectious disease endemic areas of the world.  The patient has seen their dentist in the last six month. They have seen their eye doctor in the last year. They admit to slight hearing difficulty with regard to whispered voices and some television programs.  They have deferred audiologic testing in the last year.  They do not  have excessive sun exposure. Discussed the need for sun protection: hats, long sleeves and use of sunscreen if there is significant sun exposure.   Diet: the importance of a healthy diet is discussed. They do have a healthy diet.  The benefits of regular aerobic exercise were discussed. She walks 4 times per week ,  20 minutes.   Depression screen: there are no signs or vegative symptoms of depression- irritability, change in appetite, anhedonia, sadness/tearfullness.  Cognitive assessment: the patient manages all their financial and personal affairs and is actively engaged. They could relate day,date,year and events; recalled 2/3 objects at 3 minutes; performed clock-face test normally.  The following portions of the patient's history were reviewed and updated as  appropriate: allergies, current medications, past family history, past medical history,  past surgical history, past social history  and problem list.  Visual acuity was not assessed per patient preference since she has regular follow up with her ophthalmologist. Hearing and body mass index were assessed and reviewed.   During the course of the visit the patient was educated and counseled about appropriate screening and preventive services including : fall prevention , diabetes screening, nutrition counseling, colorectal cancer screening, and recommended immunizations.    During the course of the visit , End of Life objectives were discussed at length,  Patient does  have a living will in place and has designated one of her children to be her healthcare power of attorney.  She was asked to bring a copy of both documents to the office for copying     CC: The primary encounter diagnosis was Palpitations. Diagnoses of Encounter for immunization, Cervical cancer screening, Breast cancer screening, Other specified hypothyroidism, Hyperlipidemia, Vitamin D deficiency, and Initial Medicare annual wellness visit were also pertinent to this visit.   In the spring had 4  or 5  episodes of self limiting SVT and presyncope. The episodes occurred for about two weeks,  Then resolved.  Was having some emotional stressors at the time.  Never occurred with working out, each episode lasted  several minutes, made her feel more light headed,  No vertigo  But vision started to get a little dark ,  No numbness, no hyperventilating,  No diaphoresis , and  chest pain . Drinks  really strong coffee.   Mother had atrial fib, was a smoker.    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   She has a past surgical history that includes Joint replacement (2005); Dilation and  curettage of uterus; right hip replacement; and kidney stones removal.   Her family history includes Alcohol abuse in her brother, brother, and father; Diabetes in her brother and mother; Heart disease in her brother; Hyperlipidemia in her mother; Hypertension in her brother and mother; Stroke in her mother.She reports that she quit smoking about 15 years ago. Her smoking use included Cigarettes. She has never used smokeless tobacco. She reports that she drinks alcohol. She reports that she does not use drugs.  Outpatient Medications Prior to Visit  Medication Sig Dispense Refill  . aspirin 81 MG tablet Take 81 mg by mouth daily.    Marland Kitchen CLINDAMYCIN HCL PO Take by mouth. For dental procedures     . fish oil-omega-3 fatty acids 1000 MG capsule Take 2 g by mouth daily.    Marland Kitchen ibuprofen (ADVIL,MOTRIN) 200 MG tablet Take 200 mg by mouth as needed.      Marland Kitchen levothyroxine (SYNTHROID, LEVOTHROID) 50 MCG tablet TAKE 1 TABLET BY MOUTH EVERY DAY 30 tablet 1  . nicotine polacrilex (NICORETTE) 4 MG gum Take 2 mg by mouth as needed.     . psyllium (METAMUCIL) 58.6 % packet Take 1 packet by mouth daily.      Marland Kitchen ALPRAZolam (XANAX) 0.25 MG tablet Take 1 tablet (0.25 mg total) by mouth at bedtime as needed for sleep. 30 tablet 3  . predniSONE (DELTASONE) 50 MG tablet 1 tablet daily for 5 days. (Patient not taking: Reported on 08/20/2016) 5 tablet 0   No facility-administered medications prior to visit.     Review of Systems   Patient denies headache, fevers, malaise, unintentional weight loss, skin rash, eye pain, sinus congestion and sinus pain, sore throat, dysphagia,  hemoptysis , cough, dyspnea, wheezing, chest pain, orthopnea, edema, abdominal pain, nausea, melena, diarrhea, constipation, flank pain, dysuria, hematuria, urinary  Frequency, nocturia, numbness, tingling, seizures,  Focal weakness, Loss of consciousness,  Tremor, insomnia, depression, anxiety, and suicidal ideation.      Objective:  BP 108/64    Pulse 61   Temp 98.3 F (36.8 C) (Oral)   Resp 12   Ht 5\' 5"  (1.651 m)   Wt 120 lb 8 oz (54.7 kg)   SpO2 99%   BMI 20.05 kg/m   Physical Exam   General appearance: alert, cooperative and appears stated age Head: Normocephalic, without obvious abnormality, atraumatic Eyes: conjunctivae/corneas clear. PERRL, EOM's intact. Fundi benign. Ears: normal TM's and external ear canals both ears Nose: Nares normal. Septum midline. Mucosa normal. No drainage or sinus tenderness. Throat: lips, mucosa, and tongue normal; teeth and gums normal Neck: no adenopathy, no carotid bruit, no JVD, supple, symmetrical, trachea midline and thyroid not enlarged, symmetric, no tenderness/mass/nodules Lungs: clear to auscultation bilaterally Breasts: normal appearance, no masses or tenderness Heart: regular rate and rhythm, S1, S2 normal, no murmur, click, rub or gallop Abdomen: soft, non-tender; bowel sounds normal; no masses,  no organomegaly Extremities: extremities normal, atraumatic, no cyanosis or edema Pulses: 2+ and symmetric Skin: Skin color, texture, turgor normal. No rashes or lesions Neurologic: Alert and oriented X 3, normal strength and tone. Normal symmetric reflexes. Normal coordination and gait.    Assessment &  Plan:   Problem List Items Addressed This Visit    Initial Medicare annual wellness visit    Annual Medicare wellness  exam was done as well as a comprehensive physical exam and management of acute and chronic conditions .  During the course of the visit the patient was educated and counseled about appropriate screening and preventive services including : fall prevention , diabetes screening, nutrition counseling, colorectal cancer screening, and recommended immunizations, and end of live/advance directives were discussed. .  Printed recommendations for health maintenance screenings was given.       Palpitations - Primary    Likely SVT.  . I have reviewed her 12 lead EKg done today.   It is notable for sinus bradycardia, which is not symptomatic or surprising since she is physically fit,  Runs daily without symptoms. TSH and CBC , lytes are pending  Lab Results  Component Value Date   TSH 4.04 06/27/2015         Relevant Orders   EKG 12-Lead (Completed)   Comprehensive metabolic panel   Hypothyroidism   Relevant Orders   TSH    Other Visit Diagnoses    Encounter for immunization       Relevant Orders   Flu Vaccine QUAD 36+ mos IM (Completed)   EKG 12-Lead (Completed)   Cytology - PAP   MM DIGITAL SCREENING BILATERAL   Cervical cancer screening       Relevant Orders   Cytology - PAP   Breast cancer screening       Relevant Orders   MM DIGITAL SCREENING BILATERAL   Hyperlipidemia       Relevant Orders   Lipid panel   Vitamin D deficiency       Relevant Orders   VITAMIN D 25 Hydroxy (Vit-D Deficiency, Fractures)      I am having [name] maintain her ibuprofen, CLINDAMYCIN HCL PO, psyllium, nicotine polacrilex, fish oil-omega-3 fatty acids, aspirin, predniSONE, levothyroxine, and ALPRAZolam.  Meds ordered this encounter  Medications  . ALPRAZolam (XANAX) 0.25 MG tablet    Sig: Take 1 tablet (0.25 mg total) by mouth at bedtime as needed for sleep.    Dispense:  30 tablet    Refill:  3    Medications Discontinued During This Encounter  Medication Reason  . ALPRAZolam (XANAX) 0.25 MG tablet Reorder    Follow-up: No Follow-up on file.   [name], MD

## 2016-08-21 DIAGNOSIS — R002 Palpitations: Secondary | ICD-10-CM | POA: Insufficient documentation

## 2016-08-21 LAB — COMPREHENSIVE METABOLIC PANEL
ALBUMIN: 4.2 g/dL (ref 3.5–5.2)
ALT: 11 U/L (ref 0–35)
AST: 17 U/L (ref 0–37)
Alkaline Phosphatase: 52 U/L (ref 39–117)
BILIRUBIN TOTAL: 0.5 mg/dL (ref 0.2–1.2)
BUN: 22 mg/dL (ref 6–23)
CHLORIDE: 105 meq/L (ref 96–112)
CO2: 30 meq/L (ref 19–32)
CREATININE: 0.7 mg/dL (ref 0.40–1.20)
Calcium: 8.7 mg/dL (ref 8.4–10.5)
GFR: 88.99 mL/min (ref >60.00)
Glucose, Bld: 94 mg/dL (ref 70–99)
Potassium: 4.2 mEq/L (ref 3.5–5.1)
SODIUM: 139 meq/L (ref 135–145)
Total Protein: 6.4 g/dL (ref 6.0–8.3)

## 2016-08-21 LAB — LIPID PANEL
CHOL/HDL RATIO: 2
CHOLESTEROL: 193 mg/dL (ref 0–200)
HDL: 91.4 mg/dL (ref >39.00)
LDL CALC: 91 mg/dL (ref 0–99)
NonHDL: 101.11
Triglycerides: 49 mg/dL (ref 0.0–149.0)
VLDL: 9.8 mg/dL (ref 0.0–40.0)

## 2016-08-21 LAB — VITAMIN D 25 HYDROXY (VIT D DEFICIENCY, FRACTURES): VITD: 25.09 ng/mL — AB (ref 30.00–100.00)

## 2016-08-21 LAB — TSH: TSH: 4 u[IU]/mL (ref 0.35–4.50)

## 2016-08-21 NOTE — Assessment & Plan Note (Signed)
Annual Medicare wellness  exam was done as well as a comprehensive physical exam and management of acute and chronic conditions .  During the course of the visit the patient was educated and counseled about appropriate screening and preventive services including : fall prevention , diabetes screening, nutrition counseling, colorectal cancer screening, and recommended immunizations, and end of live/advance directives were discussed. .  Printed recommendations for health maintenance screenings was given.

## 2016-08-21 NOTE — Assessment & Plan Note (Addendum)
Likely SVT.  . I have reviewed her 12 lead EKg done today.  It is notable for sinus bradycardia, which is not symptomatic or surprising since she is physically fit,  Runs daily without symptoms. TSH and CBC , lytes are pending  Lab Results  Component Value Date   TSH 4.04 06/27/2015

## 2016-08-22 ENCOUNTER — Encounter: Payer: Self-pay | Admitting: Internal Medicine

## 2016-08-22 LAB — CYTOLOGY - PAP

## 2016-08-26 ENCOUNTER — Other Ambulatory Visit: Payer: Self-pay | Admitting: Internal Medicine

## 2016-08-29 ENCOUNTER — Other Ambulatory Visit: Payer: Self-pay | Admitting: Internal Medicine

## 2016-08-29 DIAGNOSIS — Z1239 Encounter for other screening for malignant neoplasm of breast: Secondary | ICD-10-CM

## 2016-08-29 DIAGNOSIS — Z1231 Encounter for screening mammogram for malignant neoplasm of breast: Secondary | ICD-10-CM

## 2016-08-29 DIAGNOSIS — Z23 Encounter for immunization: Secondary | ICD-10-CM

## 2016-09-05 ENCOUNTER — Inpatient Hospital Stay
Admission: RE | Admit: 2016-09-05 | Discharge: 2016-09-05 | Disposition: A | Discharge status: Home / Self Care | Payer: Medicare Other | Source: Ambulatory Visit | Attending: Internal Medicine | Admitting: Internal Medicine

## 2016-09-05 ENCOUNTER — Ambulatory Visit
Admission: RE | Admit: 2016-09-05 | Discharge: 2016-09-05 | Disposition: A | Discharge status: Home / Self Care | Payer: Medicare Other | Source: Ambulatory Visit | Attending: Internal Medicine | Admitting: Internal Medicine

## 2016-09-05 DIAGNOSIS — Z23 Encounter for immunization: Secondary | ICD-10-CM

## 2016-09-05 DIAGNOSIS — Z1239 Encounter for other screening for malignant neoplasm of breast: Secondary | ICD-10-CM

## 2016-09-05 DIAGNOSIS — Z1231 Encounter for screening mammogram for malignant neoplasm of breast: Secondary | ICD-10-CM | POA: Diagnosis not present

## 2016-09-26 DIAGNOSIS — F341 Dysthymic disorder: Secondary | ICD-10-CM | POA: Diagnosis not present

## 2016-09-28 ENCOUNTER — Other Ambulatory Visit: Payer: Self-pay | Admitting: Internal Medicine

## 2016-11-12 ENCOUNTER — Telehealth: Payer: Self-pay | Admitting: Family

## 2016-11-12 ENCOUNTER — Ambulatory Visit
Admission: RE | Admit: 2016-11-12 | Discharge: 2016-11-12 | Disposition: A | Discharge status: Home / Self Care | Payer: Medicare Other | Source: Ambulatory Visit | Attending: Family | Admitting: Family

## 2016-11-12 ENCOUNTER — Ambulatory Visit (INDEPENDENT_AMBULATORY_CARE_PROVIDER_SITE_OTHER): Payer: Medicare Other | Admitting: Family

## 2016-11-12 ENCOUNTER — Encounter: Payer: Self-pay | Admitting: Family

## 2016-11-12 ENCOUNTER — Telehealth: Payer: Self-pay

## 2016-11-12 VITALS — BP 132/62 | HR 66 | Temp 97.6°F | Ht 65.0 in | Wt 123.0 lb

## 2016-11-12 DIAGNOSIS — R300 Dysuria: Secondary | ICD-10-CM | POA: Diagnosis not present

## 2016-11-12 DIAGNOSIS — Z96641 Presence of right artificial hip joint: Secondary | ICD-10-CM | POA: Diagnosis not present

## 2016-11-12 DIAGNOSIS — R319 Hematuria, unspecified: Secondary | ICD-10-CM | POA: Diagnosis not present

## 2016-11-12 DIAGNOSIS — N200 Calculus of kidney: Secondary | ICD-10-CM | POA: Insufficient documentation

## 2016-11-12 LAB — POCT URINALYSIS DIPSTICK
Bilirubin, UA: NEGATIVE
Glucose, UA: NEGATIVE
KETONES UA: NEGATIVE
Leukocytes, UA: NEGATIVE
Nitrite, UA: NEGATIVE
PH UA: 7
PROTEIN UA: NEGATIVE
SPEC GRAV UA: 1.015
Urobilinogen, UA: 0.2

## 2016-11-12 NOTE — Patient Instructions (Addendum)
Plenty of water.If CT renal is unrevealing, please ensure to make follow-up for pelvic exam to ensure blood infection coming from urethra versus vagina.   If there is no improvement in your symptoms, or if there is any worsening of symptoms, or if you have any additional concerns, please return for re-evaluation; or, if we are closed, consider going to the Emergency Room for evaluation if symptoms urgent.

## 2016-11-12 NOTE — Telephone Encounter (Signed)
Results have came back from CT.  Nonobstructing LEFT renal calculi

## 2016-11-12 NOTE — Progress Notes (Signed)
Subjective:    Patient ID: Alexis Mcdonald, female    DOB: [birth year], 65 y.o.   MRN: [id number]  CC: Alexis Mcdonald is a 65 y.o. female who presents today for an acute visit.    HPI: Patient here for acute visit with chief complaint of blood in urine. Describes as clots. H/o renal stones. No recent UTIs, dysuria.. No concern of STDs. No hysterectomy. Endorses episode of chills. No fever, low back pain, abdominal pain, blood in stool. Had sex over weekend and no deep pain. 'sort of sore on clitoris.'   Menopause in late 40's.     HISTORY:  Past Medical History:  Diagnosis Date  . Borderline osteopenia    prior D[facility]A at [facility]   . Colon polyp 2009   next one due 2012  . 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 Kidney stones   . Neutropenia    no infections,  bone marrow biopsy by [name] next week  . renal calculi    s/p lithotripsy, [place]  . Thyroid disease    hypothyroidism   Past Surgical History:  Procedure Laterality Date  . DILATION AND CURETTAGE OF UTERUS    . JOINT REPLACEMENT  2005   done in [place]  . kidney stones removal    . right hip replacement     Family History  Problem Relation Age of Onset  . Diabetes Mother   . Stroke Mother   . Hyperlipidemia Mother   . Hypertension Mother   . Alcohol abuse Father   . Diabetes Brother   . Alcohol abuse Brother   . Hypertension Brother   . Heart disease Brother     arrhythmia  . Alcohol abuse Brother     Allergies: Penicillins; Butenafine; and Terbinafine and related Current Outpatient Prescriptions on File Prior to Visit  Medication Sig Dispense Refill  . ALPRAZolam (XANAX) 0.25 MG tablet Take 1 tablet (0.25 mg total) by mouth at bedtime as needed for sleep. 30 tablet 3  . aspirin 81 MG tablet Take 81 mg by mouth daily.    Marland Kitchen CLINDAMYCIN HCL PO Take by mouth. For dental procedures     . fish oil-omega-3 fatty acids 1000 MG capsule Take 2 g by  mouth daily.    Marland Kitchen ibuprofen (ADVIL,MOTRIN) 200 MG tablet Take 200 mg by mouth as needed.      Marland Kitchen levothyroxine (SYNTHROID, LEVOTHROID) 50 MCG tablet TAKE 1 TABLET BY MOUTH EVERY DAY 30 tablet 2  . levothyroxine (SYNTHROID, LEVOTHROID) 50 MCG tablet TAKE 1 TABLET BY MOUTH EVERY DAY 30 tablet 4  . nicotine polacrilex (NICORETTE) 4 MG gum Take 2 mg by mouth as needed.     . predniSONE (DELTASONE) 50 MG tablet 1 tablet daily for 5 days. 5 tablet 0  . psyllium (METAMUCIL) 58.6 % packet Take 1 packet by mouth daily.       No current facility-administered medications on file prior to visit.     Social History  Substance Use Topics  . Smoking status: Former Smoker    Types: Cigarettes    Quit date: 09/24/2000  . Smokeless tobacco: Never Used  . Alcohol use Yes    Review of Systems  Constitutional: Negative for chills and fever.  Respiratory: Negative for cough.   Cardiovascular: Negative for chest pain and palpitations.  Gastrointestinal: Negative for nausea and vomiting.  Genitourinary: Positive for hematuria. Negative for dysuria.      Objective:  BP 132/62   Pulse 66   Temp 97.6 F (36.4 C) (Oral)   Ht 5' 5"  (1.651 m)   Wt 123 lb (55.8 kg)   SpO2 98%   BMI 20.47 kg/m    Physical Exam  Constitutional: She appears well-developed and well-nourished.  Cardiovascular: Normal rate, regular rhythm, normal heart sounds and normal pulses.   Pulmonary/Chest: Effort normal and breath sounds normal. She has no wheezes. She has no rhonchi. She has no rales.  Abdominal: There is no CVA tenderness.  Neurological: She is alert.  Skin: Skin is warm and dry.  Psychiatric: She has a normal mood and affect. Her speech is normal and behavior is normal. Thought content normal.  Vitals reviewed.      Assessment & Plan:   1. Hematuria, unspecified type Reassured patient is afebrile, no CVA tenderness. UA shows blood, negative for nitrites, leukocytes. Pending CT renal stat as patient has a  history of renal stones.  - POCT Urinalysis Dipstick - Urine Culture - CT RENAL STONE STUDY; Future    I am having [name] maintain her ibuprofen, CLINDAMYCIN HCL PO, psyllium, nicotine polacrilex, fish oil-omega-3 fatty acids, aspirin, predniSONE, ALPRAZolam, levothyroxine, and levothyroxine.   No orders of the defined types were placed in this encounter.   Return precautions given.   Risks, benefits, and alternatives of the medications and treatment plan prescribed today were discussed, and patient expressed understanding.   Education regarding symptom management and diagnosis given to patient on AVS.  Continue to follow with TULLO, Aris Everts, MD for routine health maintenance.   Alexis Mcdonald and I agreed with plan.   Mable Paris, FNP

## 2016-11-12 NOTE — Telephone Encounter (Signed)
Discussed results of CT renal nonobstructing left renal stone. Patient states she will follow-up with urology as well as he has prior scans. Return precautions given.

## 2016-11-13 LAB — URINE CULTURE: Organism ID, Bacteria: NO GROWTH

## 2016-11-18 ENCOUNTER — Other Ambulatory Visit: Payer: Self-pay | Admitting: Internal Medicine

## 2016-11-20 DIAGNOSIS — Z6820 Body mass index (BMI) 20.0-20.9, adult: Secondary | ICD-10-CM | POA: Diagnosis not present

## 2016-11-20 DIAGNOSIS — N23 Unspecified renal colic: Secondary | ICD-10-CM | POA: Diagnosis not present

## 2016-11-20 DIAGNOSIS — R310 Gross hematuria: Secondary | ICD-10-CM | POA: Diagnosis not present

## 2016-11-20 DIAGNOSIS — N200 Calculus of kidney: Secondary | ICD-10-CM | POA: Diagnosis not present

## 2016-12-12 DIAGNOSIS — F341 Dysthymic disorder: Secondary | ICD-10-CM | POA: Diagnosis not present

## 2017-01-06 ENCOUNTER — Encounter: Payer: Self-pay | Admitting: Internal Medicine

## 2017-01-06 MED ORDER — LEVOTHYROXINE SODIUM 50 MCG PO TABS: 50.0000 ug | ORAL_TABLET | Freq: Every day | ORAL | 1 refills | Status: DC

## 2017-02-08 ENCOUNTER — Other Ambulatory Visit: Payer: Self-pay | Admitting: Internal Medicine

## 2017-02-13 DIAGNOSIS — F341 Dysthymic disorder: Secondary | ICD-10-CM | POA: Diagnosis not present

## 2017-03-03 DIAGNOSIS — G5701 Lesion of sciatic nerve, right lower limb: Secondary | ICD-10-CM | POA: Diagnosis not present

## 2017-03-03 DIAGNOSIS — M25551 Pain in right hip: Secondary | ICD-10-CM | POA: Diagnosis not present

## 2017-04-03 DIAGNOSIS — F341 Dysthymic disorder: Secondary | ICD-10-CM | POA: Diagnosis not present

## 2017-05-06 DIAGNOSIS — L570 Actinic keratosis: Secondary | ICD-10-CM | POA: Diagnosis not present

## 2017-05-06 DIAGNOSIS — Z85828 Personal history of other malignant neoplasm of skin: Secondary | ICD-10-CM | POA: Diagnosis not present

## 2017-05-06 DIAGNOSIS — D2272 Melanocytic nevi of left lower limb, including hip: Secondary | ICD-10-CM | POA: Diagnosis not present

## 2017-05-06 DIAGNOSIS — D2261 Melanocytic nevi of right upper limb, including shoulder: Secondary | ICD-10-CM | POA: Diagnosis not present

## 2017-05-06 DIAGNOSIS — X32XXXA Exposure to sunlight, initial encounter: Secondary | ICD-10-CM | POA: Diagnosis not present

## 2017-05-06 DIAGNOSIS — Z85820 Personal history of malignant melanoma of skin: Secondary | ICD-10-CM | POA: Diagnosis not present

## 2017-05-29 DIAGNOSIS — F341 Dysthymic disorder: Secondary | ICD-10-CM | POA: Diagnosis not present

## 2017-07-10 DIAGNOSIS — F341 Dysthymic disorder: Secondary | ICD-10-CM | POA: Diagnosis not present

## 2017-07-29 ENCOUNTER — Other Ambulatory Visit: Payer: Self-pay | Admitting: Internal Medicine

## 2017-07-29 DIAGNOSIS — Z1231 Encounter for screening mammogram for malignant neoplasm of breast: Secondary | ICD-10-CM

## 2017-08-28 DIAGNOSIS — F341 Dysthymic disorder: Secondary | ICD-10-CM | POA: Diagnosis not present

## 2017-09-03 ENCOUNTER — Other Ambulatory Visit: Payer: Self-pay | Admitting: Internal Medicine

## 2017-09-10 ENCOUNTER — Ambulatory Visit
Admission: RE | Admit: 2017-09-10 | Discharge: 2017-09-10 | Disposition: A | Discharge status: Home / Self Care | Payer: Medicare Other | Source: Ambulatory Visit | Attending: Internal Medicine | Admitting: Internal Medicine

## 2017-09-10 DIAGNOSIS — Z1231 Encounter for screening mammogram for malignant neoplasm of breast: Secondary | ICD-10-CM

## 2017-09-13 ENCOUNTER — Encounter: Payer: Self-pay | Admitting: Internal Medicine

## 2017-09-29 DIAGNOSIS — Z23 Encounter for immunization: Secondary | ICD-10-CM | POA: Diagnosis not present

## 2017-10-09 DIAGNOSIS — F341 Dysthymic disorder: Secondary | ICD-10-CM | POA: Diagnosis not present

## 2017-12-04 DIAGNOSIS — F341 Dysthymic disorder: Secondary | ICD-10-CM | POA: Diagnosis not present

## 2018-01-15 DIAGNOSIS — F341 Dysthymic disorder: Secondary | ICD-10-CM | POA: Diagnosis not present

## 2018-02-24 ENCOUNTER — Other Ambulatory Visit: Payer: Self-pay | Admitting: Internal Medicine

## 2018-02-24 NOTE — Telephone Encounter (Signed)
Refilled: 09/03/2017 Last OV: 08/20/2016 Next OV: not scheduled

## 2018-02-26 DIAGNOSIS — M25512 Pain in left shoulder: Secondary | ICD-10-CM | POA: Diagnosis not present

## 2018-02-26 DIAGNOSIS — M7542 Impingement syndrome of left shoulder: Secondary | ICD-10-CM | POA: Diagnosis not present

## 2018-03-10 DIAGNOSIS — F341 Dysthymic disorder: Secondary | ICD-10-CM | POA: Diagnosis not present

## 2018-03-17 DIAGNOSIS — L570 Actinic keratosis: Secondary | ICD-10-CM | POA: Diagnosis not present

## 2018-03-17 DIAGNOSIS — Z85828 Personal history of other malignant neoplasm of skin: Secondary | ICD-10-CM | POA: Diagnosis not present

## 2018-03-17 DIAGNOSIS — Z85820 Personal history of malignant melanoma of skin: Secondary | ICD-10-CM | POA: Diagnosis not present

## 2018-03-17 DIAGNOSIS — D170 Benign lipomatous neoplasm of skin and subcutaneous tissue of head, face and neck: Secondary | ICD-10-CM | POA: Diagnosis not present

## 2018-03-20 ENCOUNTER — Encounter: Payer: Self-pay | Admitting: Internal Medicine

## 2018-03-20 DIAGNOSIS — Z96641 Presence of right artificial hip joint: Secondary | ICD-10-CM

## 2018-03-23 NOTE — Telephone Encounter (Signed)
Patient needs serum and urine  levels of chromium and cobalt.  I cannot tell from the order in epic whether the tests I have ordered cover all 4.  Can you check?

## 2018-03-26 DIAGNOSIS — N200 Calculus of kidney: Secondary | ICD-10-CM | POA: Diagnosis not present

## 2018-03-26 DIAGNOSIS — R82994 Hypercalciuria: Secondary | ICD-10-CM | POA: Diagnosis not present

## 2018-03-26 DIAGNOSIS — Z96641 Presence of right artificial hip joint: Secondary | ICD-10-CM | POA: Diagnosis not present

## 2018-03-27 NOTE — Telephone Encounter (Signed)
The current orders pending are correct if you want to attach a diagnosis and sign them.

## 2018-03-30 NOTE — Telephone Encounter (Deleted)
Again, orders have been pending since 03/27/18. Please attached diagnosis to orders and sign.

## 2018-04-30 DIAGNOSIS — F341 Dysthymic disorder: Secondary | ICD-10-CM | POA: Diagnosis not present

## 2018-05-04 ENCOUNTER — Other Ambulatory Visit (INDEPENDENT_AMBULATORY_CARE_PROVIDER_SITE_OTHER): Payer: Medicare Other

## 2018-05-04 DIAGNOSIS — Z96641 Presence of right artificial hip joint: Secondary | ICD-10-CM | POA: Diagnosis not present

## 2018-05-04 NOTE — Addendum Note (Signed)
Addended by: Leeanne Rio on: 05/04/2018 02:51 PM   Modules accepted: Orders

## 2018-05-04 NOTE — Addendum Note (Signed)
Addended by: Leeanne Rio on: 05/04/2018 02:54 PM   Modules accepted: Orders

## 2018-05-06 LAB — CHROMIUM, URINE: CHROMIUM, URINE: NOT DETECTED ug/L (ref 0.1–2.0)

## 2018-05-06 LAB — COBALT, URINE
Cobalt, Urine: NOT DETECTED ug/L (ref 1.0–2.0)
Creatinine(Crt),U: 0.14 g/L — ABNORMAL LOW (ref 0.30–3.00)

## 2018-05-07 ENCOUNTER — Encounter: Payer: Self-pay | Admitting: Internal Medicine

## 2018-05-07 LAB — CHROMIUM, SERUM: Chromium, Serum: 0.4 mcg/L (ref <=1.4)

## 2018-05-07 LAB — COBALT, SERUM/PLASMA: Cobalt, Serum/Plasma: 0.5 mcg/L — ABNORMAL HIGH (ref 0.1–0.4)

## 2018-05-18 ENCOUNTER — Telehealth: Payer: Self-pay | Admitting: Internal Medicine

## 2018-05-18 NOTE — Telephone Encounter (Signed)
Patient needs a repeat Urinary cobalt ordered .  but IT HAS TO BE RUN BY [facility] to detect it at lower levels.  Can this be done ?  I didn't see a way to change the resulting agency9 other one was done by  [facility])

## 2018-05-18 NOTE — Telephone Encounter (Signed)
There is no order for [facility] for that particular test that we can find. That's why we sent it to [facility] last time. I also mentioned that to patient when she was her last time.

## 2018-05-20 ENCOUNTER — Other Ambulatory Visit: Payer: Self-pay | Admitting: Internal Medicine

## 2018-05-20 DIAGNOSIS — R5383 Other fatigue: Secondary | ICD-10-CM

## 2018-05-20 DIAGNOSIS — E559 Vitamin D deficiency, unspecified: Secondary | ICD-10-CM

## 2018-05-20 DIAGNOSIS — E782 Mixed hyperlipidemia: Secondary | ICD-10-CM

## 2018-05-20 DIAGNOSIS — E039 Hypothyroidism, unspecified: Secondary | ICD-10-CM

## 2018-05-21 NOTE — Telephone Encounter (Signed)
LMTCB. Need to schedule pt a fasting lab appt and an office visit with Dr. Derrel Nip before we can refill her levothyroxine. Labs have been ordered.

## 2018-05-21 NOTE — Telephone Encounter (Signed)
Please advise 

## 2018-05-21 NOTE — Telephone Encounter (Signed)
Patient did schedule f/u & fasting labs and would like to know if she can get enough meds to last her until her appt date in June.

## 2018-05-26 ENCOUNTER — Other Ambulatory Visit (INDEPENDENT_AMBULATORY_CARE_PROVIDER_SITE_OTHER): Payer: Medicare Other

## 2018-05-26 DIAGNOSIS — R5383 Other fatigue: Secondary | ICD-10-CM | POA: Diagnosis not present

## 2018-05-26 DIAGNOSIS — E039 Hypothyroidism, unspecified: Secondary | ICD-10-CM

## 2018-05-26 DIAGNOSIS — E782 Mixed hyperlipidemia: Secondary | ICD-10-CM | POA: Diagnosis not present

## 2018-05-26 DIAGNOSIS — E559 Vitamin D deficiency, unspecified: Secondary | ICD-10-CM

## 2018-05-26 LAB — COMPREHENSIVE METABOLIC PANEL
ALBUMIN: 4.1 g/dL (ref 3.5–5.2)
ALT: 13 U/L (ref 0–35)
AST: 18 U/L (ref 0–37)
Alkaline Phosphatase: 53 U/L (ref 39–117)
BILIRUBIN TOTAL: 0.5 mg/dL (ref 0.2–1.2)
BUN: 21 mg/dL (ref 6–23)
CHLORIDE: 105 meq/L (ref 96–112)
CO2: 29 mEq/L (ref 19–32)
CREATININE: 0.62 mg/dL (ref 0.40–1.20)
Calcium: 8.9 mg/dL (ref 8.4–10.5)
GFR: 101.82 mL/min (ref >60.00)
Glucose, Bld: 102 mg/dL — ABNORMAL HIGH (ref 70–99)
Potassium: 4.6 mEq/L (ref 3.5–5.1)
SODIUM: 139 meq/L (ref 135–145)
Total Protein: 6.3 g/dL (ref 6.0–8.3)

## 2018-05-26 LAB — CBC WITH DIFFERENTIAL/PLATELET
BASOS PCT: 0.6 % (ref 0.0–3.0)
Basophils Absolute: 0 10*3/uL (ref 0.0–0.1)
EOS PCT: 1 % (ref 0.0–5.0)
Eosinophils Absolute: 0 10*3/uL (ref 0.0–0.7)
HCT: 39.9 % (ref 36.0–46.0)
HEMOGLOBIN: 13.4 g/dL (ref 12.0–15.0)
LYMPHS ABS: 1.3 10*3/uL (ref 0.7–4.0)
Lymphocytes Relative: 44.2 % (ref 12.0–46.0)
MCHC: 33.6 g/dL (ref 30.0–36.0)
MCV: 95.8 fl (ref 78.0–100.0)
MONO ABS: 0.3 10*3/uL (ref 0.1–1.0)
Monocytes Relative: 10.2 % (ref 3.0–12.0)
NEUTROS ABS: 1.3 10*3/uL — AB (ref 1.4–7.7)
Neutrophils Relative %: 44 % (ref 43.0–77.0)
PLATELETS: 150 10*3/uL (ref 150.0–400.0)
RBC: 4.16 Mil/uL (ref 3.87–5.11)
RDW: 13.1 % (ref 11.5–15.5)
WBC: 2.9 10*3/uL — ABNORMAL LOW (ref 4.0–10.5)

## 2018-05-26 LAB — LIPID PANEL
CHOLESTEROL: 203 mg/dL — AB (ref 0–200)
HDL: 95.1 mg/dL (ref >39.00)
LDL Cholesterol: 99 mg/dL (ref 0–99)
NONHDL: 108.37
Total CHOL/HDL Ratio: 2
Triglycerides: 45 mg/dL (ref 0.0–149.0)
VLDL: 9 mg/dL (ref 0.0–40.0)

## 2018-05-26 LAB — VITAMIN D 25 HYDROXY (VIT D DEFICIENCY, FRACTURES): VITD: 47.42 ng/mL (ref 30.00–100.00)

## 2018-05-26 LAB — TSH: TSH: 7.65 u[IU]/mL — ABNORMAL HIGH (ref 0.35–4.50)

## 2018-05-28 ENCOUNTER — Other Ambulatory Visit: Payer: Self-pay | Admitting: Internal Medicine

## 2018-05-28 MED ORDER — LEVOTHYROXINE SODIUM 75 MCG PO TABS: 75.0000 ug | ORAL_TABLET | Freq: Every day | ORAL | 0 refills | Status: DC

## 2018-06-18 DIAGNOSIS — F341 Dysthymic disorder: Secondary | ICD-10-CM | POA: Diagnosis not present

## 2018-06-22 ENCOUNTER — Other Ambulatory Visit: Payer: Self-pay | Admitting: Internal Medicine

## 2018-06-24 ENCOUNTER — Ambulatory Visit (INDEPENDENT_AMBULATORY_CARE_PROVIDER_SITE_OTHER): Payer: Medicare Other | Admitting: Internal Medicine

## 2018-06-24 ENCOUNTER — Encounter: Payer: Self-pay | Admitting: Internal Medicine

## 2018-06-24 VITALS — BP 114/72 | HR 59 | Temp 98.5°F | Resp 14 | Ht 65.0 in | Wt 115.6 lb

## 2018-06-24 DIAGNOSIS — T56891A Toxic effect of other metals, accidental (unintentional), initial encounter: Secondary | ICD-10-CM

## 2018-06-24 DIAGNOSIS — R790 Abnormal level of blood mineral: Secondary | ICD-10-CM

## 2018-06-24 DIAGNOSIS — Z1231 Encounter for screening mammogram for malignant neoplasm of breast: Secondary | ICD-10-CM

## 2018-06-24 DIAGNOSIS — E039 Hypothyroidism, unspecified: Secondary | ICD-10-CM

## 2018-06-24 DIAGNOSIS — Z96641 Presence of right artificial hip joint: Secondary | ICD-10-CM | POA: Diagnosis not present

## 2018-06-24 DIAGNOSIS — Z1211 Encounter for screening for malignant neoplasm of colon: Secondary | ICD-10-CM

## 2018-06-24 DIAGNOSIS — Z96642 Presence of left artificial hip joint: Secondary | ICD-10-CM | POA: Diagnosis not present

## 2018-06-24 DIAGNOSIS — D702 Other drug-induced agranulocytosis: Secondary | ICD-10-CM | POA: Diagnosis not present

## 2018-06-24 DIAGNOSIS — Z86010 Personal history of colonic polyps: Secondary | ICD-10-CM

## 2018-06-24 DIAGNOSIS — Z1239 Encounter for other screening for malignant neoplasm of breast: Secondary | ICD-10-CM

## 2018-06-24 MED ORDER — ALPRAZOLAM 0.25 MG PO TABS: 0.2500 mg | ORAL_TABLET | Freq: Every evening | ORAL | 5 refills | Status: DC | PRN

## 2018-06-24 NOTE — Progress Notes (Signed)
Patient ID: Alexis Mcdonald, female    DOB: Mar 17, 1951  Age: 67 y.o. MRN: [id number]  The patient is here for follow up and examination and management of other chronic and acute problems.  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  The roster of all physicians providing medical care to patient - is listed in the Snapshot section of the chart.  Activities of daily living:  The patient is 100% independent in all ADLs: dressing, toileting, feeding as well as independent mobility  Home safety : The patient has smoke detectors in the home. They wear seatbelts.  There are no firearms at home. There is no violence in the home.   There is no risks for hepatitis, STDs or HIV. There is no   history of blood transfusion. They have no travel history to infectious disease endemic areas of the world.  The patient has seen their dentist in the last six month. They have seen their eye doctor in the last year. They admit to slight hearing difficulty with regard to whispered voices and some television programs.  They have deferred audiologic testing in the last year.  They do not  have excessive sun exposure. Discussed the need for sun protection: hats, long sleeves and use of sunscreen if there is significant sun exposure.   Diet: the importance of a healthy diet is discussed. They do have a healthy diet.  The benefits of regular aerobic exercise were discussed. She walks 4 times per week ,  20 minutes.   Depression screen: there are no signs or vegative symptoms of depression- irritability, change in appetite, anhedonia, sadness/tearfullness.  Cognitive assessment: the patient manages all their financial and personal affairs and is actively engaged. They could relate day,date,year and events;  recalled 2/3 objects at 3 minutes; performed clock-face test normally.  The following portions of the patient's history were reviewed and updated as appropriate: allergies, current medications, past family history, past medical history,  past surgical history, past social history  and problem list.  Visual acuity was not assessed per patient preference since she has regular follow up with her ophthalmologist. Hearing and body mass index were assessed and reviewed.   During the course of the visit the patient was educated and counseled about appropriate screening and preventive services including : fall prevention , diabetes screening, nutrition counseling, colorectal cancer screening, and recommended immunizations.    CC: The primary encounter diagnosis was Colon cancer screening. 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  Thyroid was underactive last month  Dose increased to 75 mcg daily   Ongoing concern about toxic cobalt levels secondary to 67 yr old hip prosthesis. 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   She has a past surgical history that includes Joint replacement (2005); Dilation and curettage of uterus; right hip  replacement; and kidney stones removal.   Her family history includes Alcohol abuse in her brother, brother, and father; Diabetes in her brother and mother; Heart disease in her brother; Hyperlipidemia in her mother; Hypertension in her brother  and mother; Stroke in her mother.She reports that she quit smoking about 17 years ago. Her smoking use included cigarettes. She has never used smokeless tobacco. She reports that she drinks alcohol. She reports that she does not use drugs.  Outpatient Medications Prior to Visit  Medication Sig Dispense Refill  . fish oil-omega-3 fatty acids 1000 MG capsule Take 2 g by mouth daily.    Marland Kitchen ibuprofen (ADVIL,MOTRIN) 200 MG tablet Take 200 mg by mouth as needed.      Marland Kitchen levothyroxine (SYNTHROID, LEVOTHROID) 75 MCG tablet Take 1 tablet (75 mcg total) by mouth daily before breakfast. 90 tablet 0  . nicotine polacrilex (NICORETTE) 4 MG gum Take 2 mg by mouth as needed.     . psyllium (METAMUCIL) 58.6 % packet Take 1 packet by mouth daily.      Marland Kitchen ALPRAZolam (XANAX) 0.25 MG tablet Take 1 tablet (0.25 mg total) by mouth at bedtime as needed for sleep. 30 tablet 3  . CLINDAMYCIN HCL PO Take by mouth. For dental procedures     . aspirin 81 MG tablet Take 81 mg by mouth daily.    Marland Kitchen levothyroxine (SYNTHROID, LEVOTHROID) 50 MCG tablet TAKE 1 TABLET BY MOUTH EVERY DAY**NEEDS OFFICE VISIT** (Patient not taking: Reported on 06/24/2018) 33 tablet 0  . predniSONE (DELTASONE) 50 MG tablet 1 tablet daily for 5 days. (Patient not taking: Reported on 06/24/2018) 5 tablet 0   No facility-administered medications prior to visit.     Review of Systems   Patient denies headache, fevers, malaise, unintentional weight loss, skin rash, eye pain, sinus congestion and sinus pain, sore throat, dysphagia,  hemoptysis , cough, dyspnea, wheezing, chest pain, palpitations, orthopnea, edema, abdominal pain, nausea, melena, diarrhea, constipation, flank pain, dysuria, hematuria, urinary  Frequency, nocturia, numbness, tingling, seizures,  Focal weakness, Loss of consciousness,  Tremor, insomnia, depression, anxiety, and suicidal ideation.      Objective:  BP 114/72 (BP Location: Left Arm, Patient Position: Sitting, Cuff Size: Normal)    Pulse (!) 59   Temp 98.5 F (36.9 C) (Oral)   Resp 14   Ht 5\' 5"  (1.651 m)   Wt 115 lb 9.6 oz (52.4 kg)   SpO2 98%   BMI 19.24 kg/m   Physical Exam   General appearance: alert, cooperative and appears stated age Head: Normocephalic, without obvious abnormality, atraumatic Eyes: conjunctivae/corneas clear. PERRL, EOM's intact. Fundi benign. Ears: normal TM's and external ear canals both ears Nose: Nares normal. Septum midline. Mucosa normal. No drainage or sinus tenderness. Throat: lips, mucosa, and tongue normal; teeth and gums normal Neck: no adenopathy, no carotid bruit, no JVD, supple, symmetrical, trachea midline and thyroid not enlarged, symmetric, no tenderness/mass/nodules Lungs: clear to auscultation bilaterally Breasts: normal appearance, no masses or tenderness Heart: regular rate and rhythm, S1, S2 normal, no murmur, click, rub or gallop Abdomen: soft, non-tender; bowel sounds normal; no masses,  no organomegaly Extremities: extremities normal, atraumatic, no cyanosis or edema Pulses: 2+ and symmetric Skin: Skin color, texture, turgor normal. No rashes or lesions Neurologic: Alert and oriented X 3, normal strength and tone. Normal symmetric reflexes. Normal coordination and gait.      Assessment & Plan:   Problem List Items Addressed This Visit    Hypothyroidism   Relevant  Orders   TSH (Completed)   T4, free (Completed)   Neutropenia ([place])    Resolved currently.  Previous occurrence was accompanied by thrombocytopenia,  And she was evaluated and worked up for viral hepatitis, autoimmune disorders, MM and HH in 2012 by Concepcion Elk and only a single mutation for [phone] for [facility] was found. WBC and plts are now normal; etiology still unclear but may be related to serum cobalt level. Will refer back to hematology for their opinion on the significance of the elevated cobalt level.   Lab Results  Component Value Date   WBC 9.8 06/24/2018   HGB 13.3 06/24/2018   HCT 38.6  06/24/2018   MCV 95.1 06/24/2018   PLT 163.0 06/24/2018         Relevant Orders   CBC with Differential/Platelet (Completed)   Ambulatory referral to Hematology   History of adenomatous polyp of colon    She will be referred for colonoscopy due now for 5 year follow up      History of total right hip arthroplasty (Chronic)    Done by Dr Trish Mage over 20 yrs ago in [place].  Recently she requested chromium and cobalt levels which were done at his advice due to history of neurtopenia.  She had an elevated serum cobalt level of uncertain significance. . Results reviewed and referral to local orthoepdics , Dr. Marry Guan for consideration of prosthetic replacement.   Incidentally, her neutorpenia has resolved  Lab Results  Component Value Date   WBC 9.8 06/24/2018   HGB 13.3 06/24/2018   HCT 38.6 06/24/2018   MCV 95.1 06/24/2018   PLT 163.0 06/24/2018         Relevant Orders   Ambulatory referral to Hematology   Abnormal blood level of cobalt    Found during screening givne histoyr of hip prosthesis, remote.  Significance uncertain.  Ortho referral made       Relevant Orders   Ambulatory referral to Hematology    Other Visit Diagnoses    Colon cancer screening    -  Primary   Relevant Orders   Ambulatory referral to General Surgery   Breast cancer screening       Relevant Orders   MM 3D SCREEN BREAST BILATERAL   S/P hip replacement, left       Relevant Orders   Ambulatory referral to Orthopedic Surgery   Cobalt toxicity, accidental or unintentional, initial encounter       Relevant Orders   Ambulatory referral to Orthopedic Surgery      I have discontinued [name] "Patty"'s aspirin and predniSONE. I am also having her maintain her ibuprofen, CLINDAMYCIN HCL PO, psyllium, nicotine polacrilex, fish oil-omega-3 fatty acids, levothyroxine, and ALPRAZolam.  Meds ordered this encounter  Medications  . ALPRAZolam (XANAX) 0.25 MG tablet     Sig: Take 1 tablet (0.25 mg total) by mouth at bedtime as needed for sleep.    Dispense:  30 tablet    Refill:  5   A total of 40 minutes was spent with patient more than half of which was spent in counseling patient on the above mentioned issues , reviewing and explaining recent labs and imaging studies done, and coordination of care. Medications Discontinued During This Encounter  Medication Reason  . aspirin 81 MG tablet Patient has not taken in last 30 days  . levothyroxine (SYNTHROID, LEVOTHROID) 50 MCG tablet Change in therapy  . predniSONE (DELTASONE) 50 MG tablet Completed Course  . ALPRAZolam (  XANAX) 0.25 MG tablet Reorder    Follow-up: No follow-ups on file.   [name], MD

## 2018-06-24 NOTE — Patient Instructions (Signed)
Health Maintenance for Postmenopausal Women Menopause is a normal process in which your reproductive ability comes to an end. This process happens gradually over a span of months to years, usually between the ages of 22 and 9. Menopause is complete when you have missed 12 consecutive menstrual periods. It is important to talk with your health care provider about some of the most common conditions that affect postmenopausal women, such as heart disease, cancer, and bone loss (osteoporosis). Adopting a healthy lifestyle and getting preventive care can help to promote your health and wellness. Those actions can also lower your chances of developing some of these common conditions. What should I know about menopause? During menopause, you may experience a number of symptoms, such as:  Moderate-to-severe hot flashes.  Night sweats.  Decrease in sex drive.  Mood swings.  Headaches.  Tiredness.  Irritability.  Memory problems.  Insomnia.  Choosing to treat or not to treat menopausal changes is an individual decision that you make with your health care provider. What should I know about hormone replacement therapy and supplements? Hormone therapy products are effective for treating symptoms that are associated with menopause, such as hot flashes and night sweats. Hormone replacement carries certain risks, especially as you become older. If you are thinking about using estrogen or estrogen with progestin treatments, discuss the benefits and risks with your health care provider. What should I know about heart disease and stroke? Heart disease, heart attack, and stroke become more likely as you age. This may be due, in part, to the hormonal changes that your body experiences during menopause. These can affect how your body processes dietary fats, triglycerides, and cholesterol. Heart attack and stroke are both medical emergencies. There are many things that you can do to help prevent heart disease  and stroke:  Have your blood pressure checked at least every 1-2 years. High blood pressure causes heart disease and increases the risk of stroke.  If you are 53-22 years old, ask your health care provider if you should take aspirin to prevent a heart attack or a stroke.  Do not use any tobacco products, including cigarettes, chewing tobacco, or electronic cigarettes. If you need help quitting, ask your health care provider.  It is important to eat a healthy diet and maintain a healthy weight. ? Be sure to include plenty of vegetables, fruits, low-fat dairy products, and lean protein. ? Avoid eating foods that are high in solid fats, added sugars, or salt (sodium).  Get regular exercise. This is one of the most important things that you can do for your health. ? Try to exercise for at least 150 minutes each week. The type of exercise that you do should increase your heart rate and make you sweat. This is known as moderate-intensity exercise. ? Try to do strengthening exercises at least twice each week. Do these in addition to the moderate-intensity exercise.  Know your numbers.Ask your health care provider to check your cholesterol and your blood glucose. Continue to have your blood tested as directed by your health care provider.  What should I know about cancer screening? There are several types of cancer. Take the following steps to reduce your risk and to catch any cancer development as early as possible. Breast Cancer  Practice breast self-awareness. ? This means understanding how your breasts normally appear and feel. ? It also means doing regular breast self-exams. Let your health care provider know about any changes, no matter how small.  If you are 40  or older, have a clinician do a breast exam (clinical breast exam or CBE) every year. Depending on your age, family history, and medical history, it may be recommended that you also have a yearly breast X-ray (mammogram).  If you  have a family history of breast cancer, talk with your health care provider about genetic screening.  If you are at high risk for breast cancer, talk with your health care provider about having an MRI and a mammogram every year.  Breast cancer (BRCA) gene test is recommended for women who have family members with BRCA-related cancers. Results of the assessment will determine the need for genetic counseling and BRCA1 and for BRCA2 testing. BRCA-related cancers include these types: ? Breast. This occurs in males or females. ? Ovarian. ? Tubal. This may also be called fallopian tube cancer. ? Cancer of the abdominal or pelvic lining (peritoneal cancer). ? Prostate. ? Pancreatic.  Cervical, Uterine, and Ovarian Cancer Your health care provider may recommend that you be screened regularly for cancer of the pelvic organs. These include your ovaries, uterus, and vagina. This screening involves a pelvic exam, which includes checking for microscopic changes to the surface of your cervix (Pap test).  For women ages 21-65, health care providers may recommend a pelvic exam and a Pap test every three years. For women ages 79-65, they may recommend the Pap test and pelvic exam, combined with testing for human papilloma virus (HPV), every five years. Some types of HPV increase your risk of cervical cancer. Testing for HPV may also be done on women of any age who have unclear Pap test results.  Other health care providers may not recommend any screening for nonpregnant women who are considered low risk for pelvic cancer and have no symptoms. Ask your health care provider if a screening pelvic exam is right for you.  If you have had past treatment for cervical cancer or a condition that could lead to cancer, you need Pap tests and screening for cancer for at least 20 years after your treatment. If Pap tests have been discontinued for you, your risk factors (such as having a new sexual partner) need to be  reassessed to determine if you should start having screenings again. Some women have medical problems that increase the chance of getting cervical cancer. In these cases, your health care provider may recommend that you have screening and Pap tests more often.  If you have a family history of uterine cancer or ovarian cancer, talk with your health care provider about genetic screening.  If you have vaginal bleeding after reaching menopause, tell your health care provider.  There are currently no reliable tests available to screen for ovarian cancer.  Lung Cancer Lung cancer screening is recommended for adults 69-62 years old who are at high risk for lung cancer because of a history of smoking. A yearly low-dose CT scan of the lungs is recommended if you:  Currently smoke.  Have a history of at least 30 pack-years of smoking and you currently smoke or have quit within the past 15 years. A pack-year is smoking an average of one pack of cigarettes per day for one year.  Yearly screening should:  Continue until it has been 15 years since you quit.  Stop if you develop a health problem that would prevent you from having lung cancer treatment.  Colorectal Cancer  This type of cancer can be detected and can often be prevented.  Routine colorectal cancer screening usually begins at  age 42 and continues through age 45.  If you have risk factors for colon cancer, your health care provider may recommend that you be screened at an earlier age.  If you have a family history of colorectal cancer, talk with your health care provider about genetic screening.  Your health care provider may also recommend using home test kits to check for hidden blood in your stool.  A small camera at the end of a tube can be used to examine your colon directly (sigmoidoscopy or colonoscopy). This is done to check for the earliest forms of colorectal cancer.  Direct examination of the colon should be repeated every  5-10 years until age 71. However, if early forms of precancerous polyps or small growths are found or if you have a family history or genetic risk for colorectal cancer, you may need to be screened more often.  Skin Cancer  Check your skin from head to toe regularly.  Monitor any moles. Be sure to tell your health care provider: ? About any new moles or changes in moles, especially if there is a change in a mole's shape or color. ? If you have a mole that is larger than the size of a pencil eraser.  If any of your family members has a history of skin cancer, especially at a young age, talk with your health care provider about genetic screening.  Always use sunscreen. Apply sunscreen liberally and repeatedly throughout the day.  Whenever you are outside, protect yourself by wearing long sleeves, pants, a wide-brimmed hat, and sunglasses.  What should I know about osteoporosis? Osteoporosis is a condition in which bone destruction happens more quickly than new bone creation. After menopause, you may be at an increased risk for osteoporosis. To help prevent osteoporosis or the bone fractures that can happen because of osteoporosis, the following is recommended:  If you are 46-71 years old, get at least 1,000 mg of calcium and at least 600 mg of vitamin D per day.  If you are older than age 55 but younger than age 65, get at least 1,200 mg of calcium and at least 600 mg of vitamin D per day.  If you are older than age 54, get at least 1,200 mg of calcium and at least 800 mg of vitamin D per day.  Smoking and excessive alcohol intake increase the risk of osteoporosis. Eat foods that are rich in calcium and vitamin D, and do weight-bearing exercises several times each week as directed by your health care provider. What should I know about how menopause affects my mental health? Depression may occur at any age, but it is more common as you become older. Common symptoms of depression  include:  Low or sad mood.  Changes in sleep patterns.  Changes in appetite or eating patterns.  Feeling an overall lack of motivation or enjoyment of activities that you previously enjoyed.  Frequent crying spells.  Talk with your health care provider if you think that you are experiencing depression. What should I know about immunizations? It is important that you get and maintain your immunizations. These include:  Tetanus, diphtheria, and pertussis (Tdap) booster vaccine.  Influenza every year before the flu season begins.  Pneumonia vaccine.  Shingles vaccine.  Your health care provider may also recommend other immunizations. This information is not intended to replace advice given to you by your health care provider. Make sure you discuss any questions you have with your health care provider. Document Released: 02/07/2006  Document Revised: 07/05/2016 Document Reviewed: 09/19/2015 Elsevier Interactive Patient Education  2018 [employer]  

## 2018-06-25 LAB — CBC WITH DIFFERENTIAL/PLATELET
BASOS PCT: 0.8 % (ref 0.0–3.0)
Basophils Absolute: 0.1 10*3/uL (ref 0.0–0.1)
EOS PCT: 0.1 % (ref 0.0–5.0)
Eosinophils Absolute: 0 10*3/uL (ref 0.0–0.7)
HCT: 38.6 % (ref 36.0–46.0)
Hemoglobin: 13.3 g/dL (ref 12.0–15.0)
LYMPHS ABS: 1.7 10*3/uL (ref 0.7–4.0)
Lymphocytes Relative: 17.1 % (ref 12.0–46.0)
MCHC: 34.5 g/dL (ref 30.0–36.0)
MCV: 95.1 fl (ref 78.0–100.0)
MONO ABS: 0.6 10*3/uL (ref 0.1–1.0)
Monocytes Relative: 5.8 % (ref 3.0–12.0)
NEUTROS PCT: 76.2 % (ref 43.0–77.0)
Neutro Abs: 7.5 10*3/uL (ref 1.4–7.7)
Platelets: 163 10*3/uL (ref 150.0–400.0)
RBC: 4.06 Mil/uL (ref 3.87–5.11)
RDW: 13.1 % (ref 11.5–15.5)
WBC: 9.8 10*3/uL (ref 4.0–10.5)

## 2018-06-25 LAB — T4, FREE: Free T4: 0.95 ng/dL (ref 0.60–1.60)

## 2018-06-25 LAB — TSH: TSH: 3.27 u[IU]/mL (ref 0.35–4.50)

## 2018-06-26 ENCOUNTER — Encounter: Payer: Self-pay | Admitting: *Deleted

## 2018-06-27 ENCOUNTER — Telehealth: Payer: Self-pay | Admitting: Internal Medicine

## 2018-06-27 ENCOUNTER — Encounter: Payer: Self-pay | Admitting: Internal Medicine

## 2018-06-27 DIAGNOSIS — R790 Abnormal level of blood mineral: Secondary | ICD-10-CM | POA: Insufficient documentation

## 2018-06-27 NOTE — Assessment & Plan Note (Signed)
Resolved currently.  Previous occurrence was accompanied by thrombocytopenia,  And she was evaluated and worked up for viral hepatitis, autoimmune disorders, MM and HH in 2012 by Concepcion Elk and only a single mutation for [phone] for [facility] was found. WBC and plts are now normal; etiology still unclear but may be related to serum cobalt level. Will refer back to hematology for their opinion on the significance of the elevated cobalt level.   Lab Results  Component Value Date   WBC 9.8 06/24/2018   HGB 13.3 06/24/2018   HCT 38.6 06/24/2018   MCV 95.1 06/24/2018   PLT 163.0 06/24/2018

## 2018-06-27 NOTE — Assessment & Plan Note (Signed)
She will be referred for colonoscopy due now for 5 year follow up

## 2018-06-27 NOTE — Assessment & Plan Note (Signed)
Found during screening givne histoyr of hip prosthesis, remote.  Significance uncertain.  Ortho referral made

## 2018-06-27 NOTE — Assessment & Plan Note (Addendum)
Done by Dr Trish Mage over 20 yrs ago in [place].  Recently she requested chromium and cobalt levels which were done at his advice due to history of neurtopenia.  She had an elevated serum cobalt level of uncertain significance. . Results reviewed and referral to local orthoepdics , Dr. Marry Guan for consideration of prosthetic replacement.   Incidentally, her neutorpenia has resolved  Lab Results  Component Value Date   WBC 9.8 06/24/2018   HGB 13.3 06/24/2018   HCT 38.6 06/24/2018   MCV 95.1 06/24/2018   PLT 163.0 06/24/2018

## 2018-06-27 NOTE — Telephone Encounter (Signed)
hematolgy referral made re: elevated serum cobalt level

## 2018-06-29 ENCOUNTER — Encounter: Payer: Self-pay | Admitting: Internal Medicine

## 2018-06-30 ENCOUNTER — Other Ambulatory Visit: Payer: Self-pay | Admitting: Internal Medicine

## 2018-06-30 MED ORDER — LEVOTHYROXINE SODIUM 75 MCG PO TABS: 75.0000 ug | ORAL_TABLET | Freq: Every day | ORAL | 0 refills | Status: DC

## 2018-07-07 ENCOUNTER — Telehealth: Payer: Self-pay

## 2018-07-07 NOTE — Telephone Encounter (Signed)
Copied from [place] [id number]. Topic: Referral - Question >> Jul 07, 2018 11:01 AM Synthia Innocent wrote: Reason for CRM: Dr Marry Guan requesting to know which type of hip implant device she has

## 2018-07-08 ENCOUNTER — Encounter: Payer: Self-pay | Admitting: Internal Medicine

## 2018-07-08 ENCOUNTER — Inpatient Hospital Stay: Payer: Medicare Other | Attending: Internal Medicine | Admitting: Internal Medicine

## 2018-07-08 DIAGNOSIS — Z87891 Personal history of nicotine dependence: Secondary | ICD-10-CM

## 2018-07-08 DIAGNOSIS — D696 Thrombocytopenia, unspecified: Secondary | ICD-10-CM

## 2018-07-08 DIAGNOSIS — D709 Neutropenia, unspecified: Secondary | ICD-10-CM | POA: Diagnosis not present

## 2018-07-08 DIAGNOSIS — R634 Abnormal weight loss: Secondary | ICD-10-CM | POA: Diagnosis not present

## 2018-07-08 DIAGNOSIS — D702 Other drug-induced agranulocytosis: Secondary | ICD-10-CM | POA: Insufficient documentation

## 2018-07-08 DIAGNOSIS — Z85820 Personal history of malignant melanoma of skin: Secondary | ICD-10-CM | POA: Diagnosis not present

## 2018-07-08 NOTE — Telephone Encounter (Signed)
Spoke with pt and she stated that she is not sure what type of hip implant she has. She stated that she tried getting the records from the doctor that did it and they are no longer in business so she was told that she would need to contact the hospital where the surgery was performed. Pt stated that she would reach out to them to see if she could get this information.

## 2018-07-08 NOTE — Assessment & Plan Note (Addendum)
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  Unclear etiology.  Bone marrow biopsy reviewed-from 2012 reviewed.  No obvious evidence of malignancy or acute process noted.  # Abonrmal weight loss-more than 10 pounds; unintentional.  Given history of smoking /melanoma check CT of the abdomen pelvis; x-ray of the chest.  Patient up-to-date with mammograms; colonoscopy 2014 [Dr. Sankar]; due 2019.  #History of melanoma early stage; no obvious evidence of recurrence; however given the weight loss-plan above  # Thank you Dr. Derrel Nip for allowing me to participate in the care of your pleasant patient. Please do not hesitate to contact me with questions or concerns in the interim.  Follow-up with me at the cancer center based upon the results of the above work-up  Cc; Dr.Tullo

## 2018-07-08 NOTE — Progress Notes (Signed)
[facility] NOTE  Patient Care Team: Alexis Mc, MD as PCP - General (Internal Medicine) Alexis Lye, MD (General Surgery)  CHIEF COMPLAINTS/PURPOSE OF CONSULTATION:  Leukopenia  # LEUKOPENIA/NEUTROPENIA/Mild intermittent thrombocytopenia [120-130s] -Asymtomatic/ incidental 2012 BMBx- [Alexis Mcdonald/Alexis Mcdonald];ANC- 0.9;  mild nonspecific dyspoiesis; hypocellular marrow for age 67 to 35% with trilineage hematopoiesis; karyotype/SNP- Normal. HIV/hep B/C- NEG.   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   #  2019- Elevated "heavy metals- cobolt" [? S/p leaching from hip implant; 2000 [place]]   No history exists.     HISTORY OF PRESENTING ILLNESS:  Alexis Mcdonald 67 y.o.  female with prior history of leukopenia/neutropenia has been referred to [place] for repeat evaluation.  Patient states that she had a hip replacement in [place] in 2000; however more recently noticed in media regarding heavy metal exposure/metal leaking from the implant.  This led to further work-up by PCP including heavy metals levels that showed slightly elevated cobalt level.  Patient was also noted to have slightly low white count 2.9 with ANC 1.3; with normal platelets and hemoglobin.   As noted above patient had prior evaluation of a bone marrow biopsy in 2012 for leukopenia/mild thrombocytopenia-without any clear cause.  Patient has been referred to [place] for further evaluation recommendations.  Patient denies any infections-or skin rashes.  No need for frequent antibiotics.   Review of Systems  Constitutional: Positive for weight loss. Negative for chills, diaphoresis, fever and malaise/fatigue.  HENT: Negative for nosebleeds and sore throat.   Eyes: Negative for double vision.  Respiratory: Negative for cough, hemoptysis, sputum  production, shortness of breath and wheezing.   Cardiovascular: Negative for chest pain, palpitations, orthopnea and leg swelling.  Gastrointestinal: Negative for abdominal pain, blood in stool, constipation, diarrhea, heartburn, melena, nausea and vomiting.  Genitourinary: Negative for dysuria, frequency and urgency.  Musculoskeletal: Negative for back pain and joint pain.  Skin: Negative.  Negative for itching and rash.  Neurological: Negative for dizziness, tingling, focal weakness, weakness and headaches.  Endo/Heme/Allergies: Does not bruise/bleed easily.  Psychiatric/Behavioral: Negative for depression. The patient is not nervous/anxious and does not have insomnia.      MEDICAL HISTORY:  Past Medical History:  Diagnosis Date  . Borderline osteopenia    prior DEXA at [facility]   . Colon polyp 2009   next one due 2012  . 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 Kidney stones   . Neutropenia    no infections,  bone marrow biopsy by Alexis Mcdonald next week  . renal calculi    s/p lithotripsy, [place]  . Thyroid disease    hypothyroidism    SURGICAL HISTORY: Past Surgical History:  Procedure Laterality Date  . DILATION AND CURETTAGE OF UTERUS    . JOINT REPLACEMENT  2005   done in [place]  . kidney stones removal    . MELANOMA EXCISION  06/29/2016  . right hip replacement      SOCIAL HISTORY: Hx of smoked [18-50 years]. Weekends alcohol; part time water fitness instruction; [place] History   Socioeconomic History  . Marital status: Divorced    Spouse name: Not on file  . Number of children: 2  . Years of education: Not on file  . Highest education level: Not on file  Occupational History  . Occupation: Press photographer  Employer: [employer]  Social Needs  . Financial resource strain: Not on file  . Food insecurity:    Worry: Not on file    Inability: Not on file  . Transportation needs:    Medical: Not on file     Non-medical: Not on file  Tobacco Use  . Smoking status: Former Smoker    Types: Cigarettes    Last attempt to quit: 09/24/2000    Years since quitting: 17.8  . Smokeless tobacco: Never Used  Substance and Sexual Activity  . Alcohol use: Yes    Comment: "weekends"  . Drug use: No  . Sexual activity: Not Currently  Lifestyle  . Physical activity:    Days per week: Not on file    Minutes per session: Not on file  . Stress: Not on file  Relationships  . Social connections:    Talks on phone: Not on file    Gets together: Not on file    Attends religious service: Not on file    Active member of club or organization: Not on file    Attends meetings of clubs or organizations: Not on file    Relationship status: Not on file  . Intimate partner violence:    Fear of current or ex partner: Not on file    Emotionally abused: Not on file    Physically abused: Not on file    Forced sexual activity: Not on file  Other Topics Concern  . Not on file  Social History Narrative   Patient lived most her her life in [place] and moved to [place] to help care for aging mother    FAMILY HISTORY: Family History  Problem Relation Age of Onset  . Diabetes Mother   . Stroke Mother   . Hyperlipidemia Mother   . Hypertension Mother   . Alcohol abuse Father   . Diabetes Brother   . Alcohol abuse Brother   . Hypertension Brother   . Alcohol abuse Brother   . Heart disease Maternal Uncle     ALLERGIES:  is allergic to penicillins; butenafine; and terbinafine and related.  MEDICATIONS:  Current Outpatient Medications  Medication Sig Dispense Refill  . ALPRAZolam (XANAX) 0.25 MG tablet Take 1 tablet (0.25 mg total) by mouth at bedtime as needed for sleep. 30 tablet 5  . Cholecalciferol (VITAMIN D) 2000 units CAPS Take by mouth.    Marland Kitchen CLINDAMYCIN HCL PO Take by mouth. For dental procedures     . fish oil-omega-3 fatty acids 1000 MG capsule Take 2 g by mouth daily.    Marland Kitchen ibuprofen (ADVIL,MOTRIN) 200 MG  tablet Take 200 mg by mouth as needed.      Marland Kitchen levothyroxine (SYNTHROID, LEVOTHROID) 75 MCG tablet Take 1 tablet (75 mcg total) by mouth daily before breakfast. 90 tablet 0  . nicotine polacrilex (NICORETTE) 4 MG gum Take 2 mg by mouth as needed.     . psyllium (METAMUCIL) 58.6 % packet Take 1 packet by mouth daily.       No current facility-administered medications for this visit.       Marland Kitchen  PHYSICAL EXAMINATION: ECOG PERFORMANCE STATUS: 0 - Asymptomatic  Vitals:   07/08/18 1507 07/08/18 1510  BP:  122/82  Pulse:  (!) 58  Resp: 16 16  Temp:  (!) 97.4 F (36.3 C)   Filed Weights   07/08/18 1507  Weight: 115 lb (52.2 kg)    GENERAL: Well-nourished well-developed; Alert, no distress and comfortable.  She is alone.  EYES: no pallor or icterus OROPHARYNX: no thrush or ulceration; NECK: supple; no lymph nodes felt. LYMPH:  no palpable lymphadenopathy in the axillary or inguinal regions LUNGS: Decreased breath sounds auscultation bilaterally. No wheeze or crackles HEART/CVS: regular rate & rhythm and no murmurs; No lower extremity edema ABDOMEN:abdomen soft, non-tender and normal bowel sounds. No hepatomegaly or splenomegaly.  Musculoskeletal:no cyanosis of digits and no clubbing  PSYCH: alert & oriented x 3 with fluent speech NEURO: no focal motor/sensory deficits SKIN:  no rashes or significant lesions  LABORATORY DATA:  I have reviewed the data as listed Lab Results  Component Value Date   WBC 9.8 06/24/2018   HGB 13.3 06/24/2018   HCT 38.6 06/24/2018   MCV 95.1 06/24/2018   PLT 163.0 06/24/2018   Recent Labs    05/26/18 0832  NA 139  K 4.6  CL 105  CO2 29  GLUCOSE 102*  BUN 21  CREATININE 0.62  CALCIUM 8.9  PROT 6.3  ALBUMIN 4.1  AST 18  ALT 13  ALKPHOS 53  BILITOT 0.5    RADIOGRAPHIC STUDIES: I have personally reviewed the radiological images as listed and agreed with the findings in the report. No results found.  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  Unclear etiology.  Bone marrow biopsy reviewed-from 2012 reviewed.  No obvious evidence of malignancy or acute process noted.  # Abonrmal weight loss-more than 10 pounds; unintentional.  Given history of smoking /melanoma check CT of the abdomen pelvis; x-ray of the chest.  Patient up-to-date with mammograms; colonoscopy 2014 [Dr. Sankar]; due 2019.  #History of melanoma early stage; no obvious evidence of recurrence; however given the weight loss-plan above  # Thank you Dr. Derrel Nip for allowing me to participate in the care of your pleasant patient. Please do not hesitate to contact me with questions or concerns in the interim.  Follow-up with me at the cancer center based upon the results of the above work-up  Cc; Dr.Tullo    All questions were answered. The patient knows to call the clinic with any problems, questions or concerns.       Cammie Sickle, MD 07/11/2018 10:19 AM

## 2018-07-14 ENCOUNTER — Encounter: Payer: Self-pay | Admitting: Internal Medicine

## 2018-07-16 ENCOUNTER — Ambulatory Visit: Payer: Medicare Other

## 2018-07-16 DIAGNOSIS — M7542 Impingement syndrome of left shoulder: Secondary | ICD-10-CM | POA: Diagnosis not present

## 2018-07-16 DIAGNOSIS — M6281 Muscle weakness (generalized): Secondary | ICD-10-CM | POA: Diagnosis not present

## 2018-07-16 DIAGNOSIS — M25612 Stiffness of left shoulder, not elsewhere classified: Secondary | ICD-10-CM | POA: Diagnosis not present

## 2018-07-16 DIAGNOSIS — M25512 Pain in left shoulder: Secondary | ICD-10-CM | POA: Diagnosis not present

## 2018-07-21 DIAGNOSIS — M25612 Stiffness of left shoulder, not elsewhere classified: Secondary | ICD-10-CM | POA: Diagnosis not present

## 2018-07-21 DIAGNOSIS — M6281 Muscle weakness (generalized): Secondary | ICD-10-CM | POA: Diagnosis not present

## 2018-07-21 DIAGNOSIS — M7542 Impingement syndrome of left shoulder: Secondary | ICD-10-CM | POA: Diagnosis not present

## 2018-07-21 DIAGNOSIS — M25512 Pain in left shoulder: Secondary | ICD-10-CM | POA: Diagnosis not present

## 2018-07-23 DIAGNOSIS — F341 Dysthymic disorder: Secondary | ICD-10-CM | POA: Diagnosis not present

## 2018-07-28 ENCOUNTER — Ambulatory Visit: Payer: Medicare Other

## 2018-07-28 DIAGNOSIS — M25612 Stiffness of left shoulder, not elsewhere classified: Secondary | ICD-10-CM | POA: Diagnosis not present

## 2018-07-28 DIAGNOSIS — M25512 Pain in left shoulder: Secondary | ICD-10-CM | POA: Diagnosis not present

## 2018-07-28 DIAGNOSIS — M6281 Muscle weakness (generalized): Secondary | ICD-10-CM | POA: Diagnosis not present

## 2018-07-28 DIAGNOSIS — M7542 Impingement syndrome of left shoulder: Secondary | ICD-10-CM | POA: Diagnosis not present

## 2018-07-30 ENCOUNTER — Ambulatory Visit (INDEPENDENT_AMBULATORY_CARE_PROVIDER_SITE_OTHER): Payer: Medicare Other | Admitting: General Surgery

## 2018-07-30 ENCOUNTER — Encounter: Payer: Self-pay | Admitting: General Surgery

## 2018-07-30 VITALS — BP 118/62 | HR 66 | Resp 12 | Ht 65.0 in | Wt 115.0 lb

## 2018-07-30 DIAGNOSIS — Z86010 Personal history of colonic polyps: Secondary | ICD-10-CM

## 2018-07-30 MED ORDER — BISACODYL 5 MG PO TBEC: 5.0000 mg | DELAYED_RELEASE_TABLET | Freq: Once | ORAL | 0 refills | Status: AC

## 2018-07-30 MED ORDER — POLYETHYLENE GLYCOL 3350 17 GM/SCOOP PO POWD: 1.0000 | Freq: Once | ORAL | 0 refills | Status: AC

## 2018-07-30 NOTE — Progress Notes (Signed)
Patient ID: Alexis Mcdonald, female   DOB: 24-Aug-1951, 67 y.o.   MRN: [id number]  Chief Complaint  Patient presents with  . Colonoscopy    HPI Alexis Mcdonald is a 67 y.o. female.  Who presents for a colonoscopy discussion referred by Dr Derrel Nip. Former patient of Dr Jamal Collin. The last colonoscopy was completed on June 2014. Denies any gastrointestinal issues. Bowels move regular and no bleeding noted. The patient is a Press photographer.  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  HPI  Past Medical History:  Diagnosis Date  . Borderline osteopenia    prior DEXA at [facility]   . Colon polyp 2009   next one due 2012  . 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 Kidney stones   . Neutropenia    no infections,  bone marrow biopsy by [name] next week  . renal calculi    s/p lithotripsy, [place]  . Thyroid disease    hypothyroidism    Past Surgical History:  Procedure Laterality Date  . COLONOSCOPY  03/23/2010   Diverticulosis  . COLONOSCOPY W/ BIOPSIES  03/14/2006   Tubular adenoma of the rectum x2, upper lesion with focal high-grade dysplasia.  Polyps 9 mm in size.  Marland Kitchen DILATION AND CURETTAGE OF UTERUS    . JOINT REPLACEMENT  2005   done in [place]  . kidney stones removal    . MELANOMA EXCISION  06/29/2016  . right hip replacement      Family History  Problem Relation Age of Onset  . Diabetes Mother   . Stroke Mother   . Hyperlipidemia Mother   . Hypertension Mother   . Alcohol abuse Father   . Diabetes Brother   . Alcohol abuse Brother   . Hypertension Brother   . Alcohol abuse Brother   . Heart disease Maternal Uncle     Social History Social History   Tobacco Use  . Smoking status: Former Smoker    Types: Cigarettes    Last attempt to quit: 09/24/2000    Years since quitting: 17.8  . Smokeless tobacco: Never Used  Substance Use Topics  . Alcohol use: Yes     Comment: "weekends"  . Drug use: No    Allergies  Allergen Reactions  . Penicillins   . Butenafine Rash    Morbilliform rash coverin entire body  June 2014  . Lamisil [Terbinafine Hcl] Rash  . Terbinafine And Related Rash    Morbilliform rash coverin entire body  June 2014    Current Outpatient Medications  Medication Sig Dispense Refill  . ALPRAZolam (XANAX) 0.25 MG tablet Take 1 tablet (0.25 mg total) by mouth at bedtime as needed for sleep. 30 tablet 5  . Cholecalciferol (VITAMIN D) 2000 units CAPS Take by mouth.    Marland Kitchen CLINDAMYCIN HCL PO Take by mouth. For dental procedures     . etodolac (LODINE) 300 MG capsule Take 300 mg by mouth as needed.    . fish oil-omega-3 fatty acids 1000 MG capsule Take 2 g by mouth daily.    Marland Kitchen ibuprofen (ADVIL,MOTRIN) 200 MG tablet Take 200 mg by mouth as needed.      Marland Kitchen levothyroxine (SYNTHROID, LEVOTHROID) 75 MCG tablet Take 1 tablet (75 mcg total) by mouth daily before breakfast. 90 tablet 0  . nicotine polacrilex (NICORETTE) 4 MG gum Take 2 mg by mouth as needed.     . psyllium (  METAMUCIL) 58.6 % packet Take 1 packet by mouth daily.       No current facility-administered medications for this visit.     Review of Systems Review of Systems  Constitutional: Negative.   Respiratory: Negative.   Cardiovascular: Negative.     Blood pressure 118/62, pulse 66, resp. rate 12, height 5' 5"  (1.651 m), weight 115 lb (52.2 kg).  Physical Exam Physical Exam  Constitutional: She is oriented to person, place, and time. She appears well-developed and well-nourished.  Cardiovascular: Normal rate, regular rhythm and normal heart sounds.  Pulmonary/Chest: Effort normal and breath sounds normal.  Neurological: She is alert and oriented to person, place, and time.  Skin: Skin is warm and dry.    Data Reviewed 2007, 2011 2014 colonoscopy reports and images.  Pathology from 2007 reviewed.  Assessment    Prior rectal polyp with high-grade dysplasia in  2007.    Plan   Colonoscopy with possible biopsy/polypectomy prn: Information regarding the procedure, including its potential risks and complications (including but not limited to perforation of the bowel, which may require emergency surgery to repair, and bleeding) was verbally given to the patient. Educational information regarding lower intestinal endoscopy was given to the patient. Written instructions for how to complete the bowel prep using Miralax were provided. The importance of drinking ample fluids to avoid dehydration as a result of the prep emphasized.  HPI, Physical Exam, Assessment and Plan have been scribed under the direction and in the presence of Hervey Ard, MD. Gaspar Cola, CMA  I have completed the exam and reviewed the above documentation for accuracy and completeness.  I agree with the above.  Haematologist has been used and any errors in dictation or transcription are unintentional.  Hervey Ard, M.D., F.A.C.S.  The patient is scheduled for a Colonoscopy at [facility] on 10/07/18. They are aware to call the day before to get their arrival time. She will stop her Fish Oil one week prior and stop her Metamucil 3 days prior. she will take 2-61m tabs of Dulcolax the am and pm 2 days prior. Miralax prescription has been sent into the patient's pharmacy. The patient is aware of date and instructions.  Documented by Caryl-Lyn MOtis BraceLPN   JForest GleasonByrnett 07/31/2018, 7:01 AM

## 2018-07-30 NOTE — Patient Instructions (Addendum)
Colonoscopy, Adult A colonoscopy is an exam to look at the entire large intestine. During the exam, a lubricated, bendable tube is inserted into the anus and then passed into the rectum, colon, and other parts of the large intestine. A colonoscopy is often done as a part of normal colorectal screening or in response to certain symptoms, such as anemia, persistent diarrhea, abdominal pain, and blood in the stool. The exam can help screen for and diagnose medical problems, including:  Tumors.  Polyps.  Inflammation.  Areas of bleeding.  Tell a health care provider about:  Any allergies you have.  All medicines you are taking, including vitamins, herbs, eye drops, creams, and over-the-counter medicines.  Any problems you or family members have had with anesthetic medicines.  Any blood disorders you have.  Any surgeries you have had.  Any medical conditions you have.  Any problems you have had passing stool. What are the risks? Generally, this is a safe procedure. However, problems may occur, including:  Bleeding.  A tear in the intestine.  A reaction to medicines given during the exam.  Infection (rare).  What happens before the procedure? Eating and drinking restrictions Follow instructions from your health care provider about eating and drinking, which may include:  A few days before the procedure - follow a low-fiber diet. Avoid nuts, seeds, dried fruit, raw fruits, and vegetables.  1-3 days before the procedure - follow a clear liquid diet. Drink only clear liquids, such as clear broth or bouillon, black coffee or tea, clear juice, clear soft drinks or sports drinks, gelatin dessert, and popsicles. Avoid any liquids that contain red or purple dye.  On the day of the procedure - do not eat or drink anything during the 2 hours before the procedure, or within the time period that your health care provider recommends.  Bowel prep If you were prescribed an oral bowel prep  to clean out your colon:  Take it as told by your health care provider. Starting the day before your procedure, you will need to drink a large amount of medicated liquid. The liquid will cause you to have multiple loose stools until your stool is almost clear or light green.  If your skin or anus gets irritated from diarrhea, you may use these to relieve the irritation: ? Medicated wipes, such as adult wet wipes with aloe and vitamin E. ? A skin soothing-product like petroleum jelly.  If you vomit while drinking the bowel prep, take a break for up to 60 minutes and then begin the bowel prep again. If vomiting continues and you cannot take the bowel prep without vomiting, call your health care provider.  General instructions  Ask your health care provider about changing or stopping your regular medicines. This is especially important if you are taking diabetes medicines or blood thinners.  Plan to have someone take you home from the hospital or clinic. What happens during the procedure?  An IV tube may be inserted into one of your veins.  You will be given medicine to help you relax (sedative).  To reduce your risk of infection: ? Your health care team will wash or sanitize their hands. ? Your anal area will be washed with soap.  You will be asked to lie on your side with your knees bent.  Your health care provider will lubricate a long, thin, flexible tube. The tube will have a camera and a light on the end.  The tube will be inserted into your   anus.  The tube will be gently eased through your rectum and colon.  Air will be delivered into your colon to keep it open. You may feel some pressure or cramping.  The camera will be used to take images during the procedure.  A small tissue sample may be removed from your body to be examined under a microscope (biopsy). If any potential problems are found, the tissue will be sent to a lab for testing.  If small polyps are found, your  health care provider may remove them and have them checked for cancer cells.  The tube that was inserted into your anus will be slowly removed. The procedure may vary among health care providers and hospitals. What happens after the procedure?  Your blood pressure, heart rate, breathing rate, and blood oxygen level will be monitored until the medicines you were given have worn off.  Do not drive for 24 hours after the exam.  You may have a small amount of blood in your stool.  You may pass gas and have mild abdominal cramping or bloating due to the air that was used to inflate your colon during the exam.  It is up to you to get the results of your procedure. Ask your health care provider, or the department performing the procedure, when your results will be ready. This information is not intended to replace advice given to you by your health care provider. Make sure you discuss any questions you have with your health care provider. Document Released: 12/13/2000 Document Revised: 10/16/2016 Document Reviewed: 02/27/2016 Elsevier Interactive Patient Education  [employer].   The patient is scheduled for a Colonoscopy at [facility] on 10/07/18. They are aware to call the day before to get their arrival time. She will stop her Fish Oil one week prior and stop her Metamucil 3 days prior. she will take 2-5mg  tabs of Dulcolax the am and pm 2 days prior. Miralax prescription has been sent into the patient's pharmacy. The patient is aware of date and instructions.

## 2018-07-31 ENCOUNTER — Encounter: Payer: Self-pay | Admitting: General Surgery

## 2018-08-04 DIAGNOSIS — M6281 Muscle weakness (generalized): Secondary | ICD-10-CM | POA: Diagnosis not present

## 2018-08-04 DIAGNOSIS — M7542 Impingement syndrome of left shoulder: Secondary | ICD-10-CM | POA: Diagnosis not present

## 2018-08-04 DIAGNOSIS — M25512 Pain in left shoulder: Secondary | ICD-10-CM | POA: Diagnosis not present

## 2018-08-04 DIAGNOSIS — M25612 Stiffness of left shoulder, not elsewhere classified: Secondary | ICD-10-CM | POA: Diagnosis not present

## 2018-08-11 DIAGNOSIS — M25512 Pain in left shoulder: Secondary | ICD-10-CM | POA: Diagnosis not present

## 2018-08-11 DIAGNOSIS — M7542 Impingement syndrome of left shoulder: Secondary | ICD-10-CM | POA: Diagnosis not present

## 2018-08-11 DIAGNOSIS — M6281 Muscle weakness (generalized): Secondary | ICD-10-CM | POA: Diagnosis not present

## 2018-08-11 DIAGNOSIS — M25612 Stiffness of left shoulder, not elsewhere classified: Secondary | ICD-10-CM | POA: Diagnosis not present

## 2018-08-20 DIAGNOSIS — M7542 Impingement syndrome of left shoulder: Secondary | ICD-10-CM | POA: Diagnosis not present

## 2018-08-24 ENCOUNTER — Other Ambulatory Visit: Payer: Self-pay | Admitting: Internal Medicine

## 2018-08-25 ENCOUNTER — Telehealth: Payer: Self-pay

## 2018-08-25 NOTE — Telephone Encounter (Signed)
Patient called and wanted to reschedule her surgery for 10/07/18. The patient is rescheduled for a Colonoscopy at [facility] on 10/21/18. They are aware to call the day before to get their arrival time. She has her prescriptions. The patient is aware of date and instructions.

## 2018-09-01 ENCOUNTER — Other Ambulatory Visit: Payer: Self-pay | Admitting: Physician Assistant

## 2018-09-01 DIAGNOSIS — M12812 Other specific arthropathies, not elsewhere classified, left shoulder: Secondary | ICD-10-CM

## 2018-09-01 DIAGNOSIS — M75102 Unspecified rotator cuff tear or rupture of left shoulder, not specified as traumatic: Principal | ICD-10-CM

## 2018-09-15 ENCOUNTER — Ambulatory Visit
Admission: RE | Admit: 2018-09-15 | Discharge: 2018-09-15 | Disposition: A | Discharge status: Home / Self Care | Payer: Medicare Other | Source: Ambulatory Visit | Attending: Physician Assistant | Admitting: Physician Assistant

## 2018-09-15 DIAGNOSIS — M12812 Other specific arthropathies, not elsewhere classified, left shoulder: Secondary | ICD-10-CM

## 2018-09-15 DIAGNOSIS — M19012 Primary osteoarthritis, left shoulder: Secondary | ICD-10-CM | POA: Insufficient documentation

## 2018-09-15 DIAGNOSIS — M25412 Effusion, left shoulder: Secondary | ICD-10-CM | POA: Insufficient documentation

## 2018-09-15 DIAGNOSIS — M75102 Unspecified rotator cuff tear or rupture of left shoulder, not specified as traumatic: Secondary | ICD-10-CM

## 2018-09-15 DIAGNOSIS — M75122 Complete rotator cuff tear or rupture of left shoulder, not specified as traumatic: Secondary | ICD-10-CM | POA: Insufficient documentation

## 2018-09-15 DIAGNOSIS — M7552 Bursitis of left shoulder: Secondary | ICD-10-CM | POA: Insufficient documentation

## 2018-09-23 ENCOUNTER — Ambulatory Visit
Admission: RE | Admit: 2018-09-23 | Discharge: 2018-09-23 | Disposition: A | Discharge status: Home / Self Care | Payer: Medicare Other | Source: Ambulatory Visit | Attending: Internal Medicine | Admitting: Internal Medicine

## 2018-09-23 DIAGNOSIS — Z1239 Encounter for other screening for malignant neoplasm of breast: Secondary | ICD-10-CM

## 2018-09-23 DIAGNOSIS — Z1231 Encounter for screening mammogram for malignant neoplasm of breast: Secondary | ICD-10-CM | POA: Diagnosis not present

## 2018-09-24 DIAGNOSIS — F341 Dysthymic disorder: Secondary | ICD-10-CM | POA: Diagnosis not present

## 2018-09-29 ENCOUNTER — Ambulatory Visit (INDEPENDENT_AMBULATORY_CARE_PROVIDER_SITE_OTHER): Payer: Medicare Other | Admitting: Family Medicine

## 2018-09-29 ENCOUNTER — Encounter: Payer: Self-pay | Admitting: Family Medicine

## 2018-09-29 VITALS — BP 120/64 | HR 55 | Temp 98.8°F | Ht 65.0 in | Wt 116.6 lb

## 2018-09-29 DIAGNOSIS — R05 Cough: Secondary | ICD-10-CM

## 2018-09-29 DIAGNOSIS — J0100 Acute maxillary sinusitis, unspecified: Secondary | ICD-10-CM

## 2018-09-29 DIAGNOSIS — R059 Cough, unspecified: Secondary | ICD-10-CM

## 2018-09-29 MED ORDER — DOXYCYCLINE HYCLATE 100 MG PO TABS: 100.0000 mg | ORAL_TABLET | Freq: Two times a day (BID) | ORAL | 0 refills | Status: DC

## 2018-09-29 MED ORDER — BENZONATATE 100 MG PO CAPS: 100.0000 mg | ORAL_CAPSULE | Freq: Three times a day (TID) | ORAL | 0 refills | Status: DC | PRN

## 2018-09-29 MED ORDER — GUAIFENESIN-CODEINE 100-10 MG/5ML PO SOLN: 10.0000 mL | Freq: Four times a day (QID) | ORAL | 0 refills | Status: DC | PRN

## 2018-09-29 NOTE — Patient Instructions (Signed)
Cough, Adult A cough helps to clear your throat and lungs. A cough may last only 2-3 weeks (acute), or it may last longer than 8 weeks (chronic). Many different things can cause a cough. A cough may be a sign of an illness or another medical condition. Follow these instructions at home:  Pay attention to any changes in your cough.  Take medicines only as told by your doctor. ? If you were prescribed an antibiotic medicine, take it as told by your doctor. Do not stop taking it even if you start to feel better. ? Talk with your doctor before you try using a cough medicine.  Drink enough fluid to keep your pee (urine) clear or pale yellow.  If the air is dry, use a cold steam vaporizer or humidifier in your home.  Stay away from things that make you cough at work or at home.  If your cough is worse at night, try using extra pillows to raise your head up higher while you sleep.  Do not smoke, and try not to be around smoke. If you need help quitting, ask your doctor.  Do not have caffeine.  Do not drink alcohol.  Rest as needed. Contact a doctor if:  You have new problems (symptoms).  You cough up yellow fluid (pus).  Your cough does not get better after 2-3 weeks, or your cough gets worse.  Medicine does not help your cough and you are not sleeping well.  You have pain that gets worse or pain that is not helped with medicine.  You have a fever.  You are losing weight and you do not know why.  You have night sweats. Get help right away if:  You cough up blood.  You have trouble breathing.  Your heartbeat is very fast. This information is not intended to replace advice given to you by your health care provider. Make sure you discuss any questions you have with your health care provider. Document Released: 08/29/2011 Document Revised: 05/23/2016 Document Reviewed: 02/22/2015 Elsevier Interactive Patient Education  2018 [employer]. Sinusitis, Adult Sinusitis is  soreness and inflammation of your sinuses. Sinuses are hollow spaces in the bones around your face. They are located:  Around your eyes.  In the middle of your forehead.  Behind your nose.  In your cheekbones.  Your sinuses and nasal passages are lined with a stringy fluid (mucus). Mucus normally drains out of your sinuses. When your nasal tissues get inflamed or swollen, the mucus can get trapped or blocked so air cannot flow through your sinuses. This lets bacteria, viruses, and funguses grow, and that leads to infection. Follow these instructions at home: Medicines  Take, use, or apply over-the-counter and prescription medicines only as told by your doctor. These may include nasal sprays.  If you were prescribed an antibiotic medicine, take it as told by your doctor. Do not stop taking the antibiotic even if you start to feel better. Hydrate and Humidify  Drink enough water to keep your pee (urine) clear or pale yellow.  Use a cool mist humidifier to keep the humidity level in your home above 50%.  Breathe in steam for 10-15 minutes, 3-4 times a day or as told by your doctor. You can do this in the bathroom while a hot shower is running.  Try not to spend time in cool or dry air. Rest  Rest as much as possible.  Sleep with your head raised (elevated).  Make sure to get enough sleep  each night. General instructions  Put a warm, moist washcloth on your face 3-4 times a day or as told by your doctor. This will help with discomfort.  Wash your hands often with soap and water. If there is no soap and water, use hand sanitizer.  Do not smoke. Avoid being around people who are smoking (secondhand smoke).  Keep all follow-up visits as told by your doctor. This is important. Contact a doctor if:  You have a fever.  Your symptoms get worse.  Your symptoms do not get better within 10 days. Get help right away if:  You have a very bad headache.  You cannot stop throwing up  (vomiting).  You have pain or swelling around your face or eyes.  You have trouble seeing.  You feel confused.  Your neck is stiff.  You have trouble breathing. This information is not intended to replace advice given to you by your health care provider. Make sure you discuss any questions you have with your health care provider. Document Released: 06/03/2008 Document Revised: 08/11/2016 Document Reviewed: 10/11/2015 Elsevier Interactive Patient Education  [employer].

## 2018-09-29 NOTE — Progress Notes (Signed)
Subjective:    Patient ID: Alexis Mcdonald, female    DOB: [birth year], 67 y.o.   MRN: [id number]  HPI  Presents to clinic with congestion, ear pain, sinus pressure for 7 days and dry cough for 4 days.   Patient has been using Flonase to help nasal congestion without much relief.  States she thought she was feeling better a couple nights ago, but symptoms came back stronger on Sunday/Monday night.  Denies any fever or chills.  Denies any phlegm production with cough.  Does report yellow nasal drainage.  She is flying to [facility] tomorrow evening, would like something that would help calm her cough especially for the long flight.   Patient Active Problem List   Diagnosis Date Noted  . 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 Abnormal weight loss 07/08/2018  . Abnormal blood level of cobalt 06/27/2018  . 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 Melanoma of skin ([place]) 07/27/2015  . History of total right hip arthroplasty 02/09/2015  . Osteopenia 05/25/2014  . 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 History of nephrolithiasis 07/13/2013  . Adverse drug reaction 06/29/2013  . History of adenomatous polyp of colon 06/01/2013  . Hemochromatosis carrier   . Initial Medicare annual wellness visit 05/07/2013  . Hypothyroidism 09/25/2011  . History of idiopathic seizure 09/25/2011  . Screening for cervical cancer 09/25/2011  . Screening for breast cancer 09/25/2011  . Neutropenia ([facility])    Social History   Tobacco Use  . Smoking status: Former Smoker    Types: Cigarettes    Last attempt to quit: 09/24/2000    Years since quitting: 18.0  . Smokeless tobacco: Never Used  Substance Use Topics  . Alcohol use: Yes    Comment: "weekends"    Review of Systems  Constitutional: Negative for chills, fatigue and fever.  HENT: +congestion, ear pain, sinus pain/pressure  Eyes: Negative.   Respiratory: +dry cough.  Negative for shortness of breath and wheezing.     Cardiovascular: Negative for chest pain, palpitations and leg swelling.  Gastrointestinal: Negative for abdominal pain, diarrhea, nausea and vomiting.  Genitourinary: Negative for dysuria, frequency and urgency.  Musculoskeletal: Negative for arthralgias and myalgias.  Skin: Negative for color change, pallor and rash.  Neurological: Negative for syncope, light-headedness and headaches.  Psychiatric/Behavioral: The patient is not nervous/anxious.       Objective:   Physical Exam  Constitutional: She appears well-developed and well-nourished. No distress.  Head: Normocephalic and atraumatic.  Eyes: EOM are normal. No scleral icterus. Conjunctivae normal. Nose/throat: Positive maxillary sinus tenderness.  Positive postnasal drip.  Positive yellow nasal drainage.  Positive inflamed nasal passages. Neck: Normal range of motion. Neck supple. No tracheal deviation present.  Cardiovascular: Normal rate, regular rhythm and normal heart sounds.  Pulmonary/Chest: Effort normal and breath sounds normal. No respiratory distress. No wheeze, rhonchi or rales.    Neurological: She is alert and oriented to person, place, and time.  Gait normal  Skin: Skin is warm and dry. No pallor.  Psychiatric: She has a normal mood and affect. Her behavior is normal. Thought content normal.   Nursing note and vitals reviewed.    Vitals:   09/29/18 0954  BP: 120/64  Pulse: (!) 55  Temp: 98.8 F (37.1 C)  SpO2: 97%    Assessment & Plan:    Sinusitis - patient will take doxycycline twice daily for 10 days; patient is PCN allergic.  Advised to continue Flonase as she has been doing.  Also advised she can use an over-the-counter antihistamine to help drive congestion such as Allegra or Claritin.  Patient was offered prescription for Allegra, but states she has this at home.  Cough/postnasal drip -- I believe cough is directly related to postnasal drip.  Patient will use Tessalon Perles as needed to calm cough  during daytime, cough syrup with codeine to use as needed to calm cough for nighttime and times where she knows she can be at home in get good rest. PMP narcotic registry checked and codeine cough syrup is appropriate.   Keep regularly scheduled follow-up as planned.  Return to clinic sooner if issues persist or worsen.

## 2018-10-12 DIAGNOSIS — S46012A Strain of muscle(s) and tendon(s) of the rotator cuff of left shoulder, initial encounter: Secondary | ICD-10-CM | POA: Diagnosis not present

## 2018-10-12 DIAGNOSIS — M7582 Other shoulder lesions, left shoulder: Secondary | ICD-10-CM | POA: Diagnosis not present

## 2018-10-21 ENCOUNTER — Encounter
Admission: RE | Disposition: A | Discharge status: Home / Self Care | Payer: Self-pay | Source: Ambulatory Visit | Attending: General Surgery

## 2018-10-21 ENCOUNTER — Encounter: Payer: Self-pay | Admitting: *Deleted

## 2018-10-21 ENCOUNTER — Ambulatory Visit
Admission: RE | Admit: 2018-10-21 | Discharge: 2018-10-21 | Disposition: A | Discharge status: Home / Self Care | Payer: Medicare Other | Source: Ambulatory Visit | Attending: General Surgery | Admitting: General Surgery

## 2018-10-21 ENCOUNTER — Ambulatory Visit: Payer: Medicare Other | Admitting: Anesthesiology

## 2018-10-21 DIAGNOSIS — Z79899 Other long term (current) drug therapy: Secondary | ICD-10-CM | POA: Insufficient documentation

## 2018-10-21 DIAGNOSIS — Z888 Allergy status to other drugs, medicaments and biological substances status: Secondary | ICD-10-CM | POA: Insufficient documentation

## 2018-10-21 DIAGNOSIS — Z86010 Personal history of colonic polyps: Secondary | ICD-10-CM | POA: Insufficient documentation

## 2018-10-21 DIAGNOSIS — D123 Benign neoplasm of transverse colon: Secondary | ICD-10-CM | POA: Insufficient documentation

## 2018-10-21 DIAGNOSIS — Z148 Genetic carrier of other disease: Secondary | ICD-10-CM | POA: Insufficient documentation

## 2018-10-21 DIAGNOSIS — Z87891 Personal history of nicotine dependence: Secondary | ICD-10-CM | POA: Insufficient documentation

## 2018-10-21 DIAGNOSIS — K635 Polyp of colon: Secondary | ICD-10-CM | POA: Diagnosis not present

## 2018-10-21 DIAGNOSIS — Z96641 Presence of right artificial hip joint: Secondary | ICD-10-CM | POA: Diagnosis not present

## 2018-10-21 DIAGNOSIS — D709 Neutropenia, unspecified: Secondary | ICD-10-CM | POA: Insufficient documentation

## 2018-10-21 DIAGNOSIS — Z880 Allergy status to penicillin: Secondary | ICD-10-CM | POA: Insufficient documentation

## 2018-10-21 DIAGNOSIS — K5730 Diverticulosis of large intestine without perforation or abscess without bleeding: Secondary | ICD-10-CM | POA: Diagnosis not present

## 2018-10-21 DIAGNOSIS — E039 Hypothyroidism, unspecified: Secondary | ICD-10-CM | POA: Diagnosis not present

## 2018-10-21 DIAGNOSIS — Z1211 Encounter for screening for malignant neoplasm of colon: Secondary | ICD-10-CM | POA: Insufficient documentation

## 2018-10-21 DIAGNOSIS — Z87442 Personal history of urinary calculi: Secondary | ICD-10-CM | POA: Insufficient documentation

## 2018-10-21 DIAGNOSIS — Z23 Encounter for immunization: Secondary | ICD-10-CM | POA: Diagnosis not present

## 2018-10-21 DIAGNOSIS — K5130 Ulcerative (chronic) rectosigmoiditis without complications: Secondary | ICD-10-CM | POA: Diagnosis not present

## 2018-10-21 DIAGNOSIS — D126 Benign neoplasm of colon, unspecified: Secondary | ICD-10-CM | POA: Diagnosis not present

## 2018-10-21 HISTORY — DX: Personal history of urinary calculi: Z87.442

## 2018-10-21 HISTORY — PX: COLONOSCOPY WITH PROPOFOL: SHX5780

## 2018-10-21 SURGERY — COLONOSCOPY WITH PROPOFOL
Anesthesia: General

## 2018-10-21 MED ORDER — FENTANYL CITRATE (PF) 100 MCG/2ML IJ SOLN
INTRAMUSCULAR | Status: AC
  Filled 2018-10-21: qty 2

## 2018-10-21 MED ORDER — PROPOFOL 500 MG/50ML IV EMUL
INTRAVENOUS | Status: AC
  Filled 2018-10-21: qty 50

## 2018-10-21 MED ORDER — LIDOCAINE HCL (PF) 2 % IJ SOLN
INTRAMUSCULAR | Status: AC
  Filled 2018-10-21: qty 10

## 2018-10-21 MED ORDER — PROPOFOL 500 MG/50ML IV EMUL
INTRAVENOUS | Status: DC | PRN
  Administered 2018-10-21: 160 ug/kg/min via INTRAVENOUS

## 2018-10-21 MED ORDER — FENTANYL CITRATE (PF) 100 MCG/2ML IJ SOLN
INTRAMUSCULAR | Status: DC | PRN
  Administered 2018-10-21: 50 ug via INTRAVENOUS

## 2018-10-21 MED ORDER — GLYCOPYRROLATE 0.2 MG/ML IJ SOLN
INTRAMUSCULAR | Status: AC
  Filled 2018-10-21: qty 1

## 2018-10-21 MED ORDER — SODIUM CHLORIDE 0.9 % IV SOLN
INTRAVENOUS | Status: DC
  Administered 2018-10-21: 1000 mL via INTRAVENOUS

## 2018-10-21 MED ORDER — LIDOCAINE 2% (20 MG/ML) 5 ML SYRINGE
INTRAMUSCULAR | Status: DC | PRN
  Administered 2018-10-21: 30 mg via INTRAVENOUS

## 2018-10-21 MED ORDER — SODIUM CHLORIDE 0.9 % IV SOLN
INTRAVENOUS | Status: DC | PRN
  Administered 2018-10-21: 07:00:00 via INTRAVENOUS

## 2018-10-21 MED ORDER — PROPOFOL 10 MG/ML IV BOLUS
INTRAVENOUS | Status: DC | PRN
  Administered 2018-10-21: 100 mg via INTRAVENOUS

## 2018-10-21 MED ORDER — PROPOFOL 10 MG/ML IV BOLUS
INTRAVENOUS | Status: AC
  Filled 2018-10-21: qty 20

## 2018-10-21 MED ORDER — EPHEDRINE SULFATE 50 MG/ML IJ SOLN
INTRAMUSCULAR | Status: DC | PRN
  Administered 2018-10-21 (×2): 10 mg via INTRAVENOUS

## 2018-10-21 NOTE — Op Note (Signed)
[facility] Patient Name: Alexis Mcdonald Procedure Date: 10/21/2018 7:40 AM MRN: [id number] Account #: [id number] Date of Birth: February 06, 1951 Admit Type: Outpatient Age: 67 Room: [facility] ENDO ROOM 1 Gender: Female Note Status: Finalized Procedure:            Colonoscopy Indications:          High risk colon cancer surveillance: Personal history                        of colonic polyps Providers:            Robert Bellow, MD Referring MD:         Deborra Medina, MD (Referring MD) Medicines:            Monitored Anesthesia Care Procedure:            Pre-Anesthesia Assessment:                       - Prior to the procedure, a History and Physical was                        performed, and patient medications, allergies and                        sensitivities were reviewed. The patient's tolerance of                        previous anesthesia was reviewed.                       - The risks and benefits of the procedure and the                        sedation options and risks were discussed with the                        patient. All questions were answered and informed                        consent was obtained.                       After obtaining informed consent, the colonoscope was                        passed under direct vision. Throughout the procedure,                        the patient's blood pressure, pulse, and oxygen                        saturations were monitored continuously. The                        Colonoscope was introduced through the anus and                        advanced to the the cecum, identified by appendiceal                        orifice and ileocecal valve. The colonoscopy was  performed with moderate difficulty due to multiple                        diverticula in the colon and a tortuous colon.                        Successful completion of the procedure was aided by              using manual pressure. The patient tolerated the                        procedure well. The quality of the bowel preparation                        was adequate to identify polyps. Findings:      A 8 mm polyp was found in the proximal transverse colon. The polyp was       sessile. Biopsies were taken with a cold forceps for histology.      Many medium-mouthed diverticula were found in the sigmoid colon. Impression:           - One 8 mm polyp in the proximal transverse colon.                        Biopsied.                       - Diverticulosis in the sigmoid colon. Recommendation:       - Telephone endoscopist for pathology results in 1 week. Procedure Code(s):    --- Professional ---                       [phone], Colonoscopy, flexible; with biopsy, single or                        multiple Diagnosis Code(s):    --- Professional ---                       Z86.010, Personal history of colonic polyps CPT copyright 2018 American Medical Association. All rights reserved. The codes documented in this report are preliminary and upon coder review may  be revised to meet current compliance requirements. Robert Bellow, MD 10/21/2018 8:33:39 AM This report has been signed electronically. Number of Addenda: 0 Note Initiated On: 10/21/2018 7:40 AM Scope Withdrawal Time: 0 hours 26 minutes 34 seconds  Total Procedure Duration: 0 hours 41 minutes 46 seconds       [facility]

## 2018-10-21 NOTE — Transfer of Care (Signed)
Immediate Anesthesia Transfer of Care Note  Patient: Alexis Mcdonald [facility]  Procedure(s) Performed: COLONOSCOPY WITH PROPOFOL (N/A )  Patient Location: PACU and Endoscopy Unit  Anesthesia Type:General  Level of Consciousness: awake and patient cooperative  Airway & Oxygen Therapy: Patient Spontanous Breathing and Patient connected to nasal cannula oxygen  Post-op Assessment: Report given to RN and Post -op Vital signs reviewed and stable  Post vital signs: Reviewed and stable  Last Vitals:  Vitals Value Taken Time  BP    Temp    Pulse 71 10/21/2018  8:35 AM  Resp 17 10/21/2018  8:35 AM  SpO2 100 % 10/21/2018  8:35 AM  Vitals shown include unvalidated device data.  Last Pain:  Vitals:   10/21/18 0704  TempSrc: Tympanic  PainSc: 0-No pain         Complications: No apparent anesthesia complications

## 2018-10-21 NOTE — Anesthesia Post-op Follow-up Note (Signed)
Anesthesia QCDR form completed.        

## 2018-10-21 NOTE — Anesthesia Preprocedure Evaluation (Signed)
Anesthesia Evaluation  Patient identified by MRN, date of birth, ID band Patient awake    Reviewed: Allergy & Precautions, NPO status , Patient's Chart, lab work & pertinent test results  History of Anesthesia Complications Negative for: history of anesthetic complications  Airway Mallampati: II  TM Distance: >3 FB Neck ROM: Full    Dental no notable dental hx.    Pulmonary neg sleep apnea, neg COPD, former smoker,    breath sounds clear to auscultation- rhonchi (-) wheezing      Cardiovascular Exercise Tolerance: Good (-) hypertension(-) CAD, (-) Past MI, (-) Cardiac Stents and (-) CABG  Rhythm:Regular Rate:Normal - Systolic murmurs and - Diastolic murmurs    Neuro/Psych negative neurological ROS  negative psych ROS   GI/Hepatic negative GI ROS, Neg liver ROS,   Endo/Other  neg diabetesHypothyroidism   Renal/GU Renal disease: hx of nephrolithiasis.     Musculoskeletal negative musculoskeletal ROS (+)   Abdominal (+) - obese,   Peds  Hematology negative hematology ROS (+)   Anesthesia Other Findings Past Medical History: No date: Borderline osteopenia     Comment:  prior DEXA at [facility]  2009: Colon polyp     Comment:  next one due 2012 march 2012: Hemochromatosis carrier     Comment:  [name]  by March onc eval [name]) No date: History of kidney stones No date: Kidney stones No date: Neutropenia     Comment:  no infections,  bone marrow biopsy by [name] next week No date: renal calculi     Comment:  s/p lithotripsy, [place] No date: Thyroid disease     Comment:  hypothyroidism   Reproductive/Obstetrics                             Anesthesia Physical Anesthesia Plan  ASA: II  Anesthesia Plan: General   Post-op Pain Management:    Induction: Intravenous  PONV Risk Score and Plan: 2 and Propofol infusion  Airway Management Planned: Natural Airway  Additional  Equipment:   Intra-op Plan:   Post-operative Plan:   Informed Consent: I have reviewed the patients History and Physical, chart, labs and discussed the procedure including the risks, benefits and alternatives for the proposed anesthesia with the patient or authorized representative who has indicated his/her understanding and acceptance.   Dental advisory given  Plan Discussed with: CRNA and Anesthesiologist  Anesthesia Plan Comments:         Anesthesia Quick Evaluation

## 2018-10-21 NOTE — Anesthesia Postprocedure Evaluation (Signed)
Anesthesia Post Note  Patient: Alexis Mcdonald [facility]  Procedure(s) Performed: COLONOSCOPY WITH PROPOFOL (N/A )  Patient location during evaluation: Endoscopy Anesthesia Type: General Level of consciousness: awake and alert and oriented Pain management: pain level controlled Vital Signs Assessment: post-procedure vital signs reviewed and stable Respiratory status: spontaneous breathing, nonlabored ventilation and respiratory function stable Cardiovascular status: blood pressure returned to baseline and stable Postop Assessment: no signs of nausea or vomiting Anesthetic complications: no     Last Vitals:  Vitals:   10/21/18 0853 10/21/18 0856  BP: 99/64 104/80  Pulse: (!) 57 (!) 56  Resp: 12 10  Temp:    SpO2: 100% 100%    Last Pain:  Vitals:   10/21/18 0853  TempSrc:   PainSc: 0-No pain                 Benz Vandenberghe

## 2018-10-21 NOTE — H&P (Signed)
Alexis Mcdonald [id number] 21-May-1951     HPI:  Patient with dysplastic rectal polyp in 2007. Negative exams since that time. For repeat colonoscopy.   Medications Prior to Admission  Medication Sig Dispense Refill Last Dose  . ALPRAZolam (XANAX) 0.25 MG tablet Take 1 tablet (0.25 mg total) by mouth at bedtime as needed for sleep. 30 tablet 5 Past Week at Unknown time  . celecoxib (CELEBREX) 200 MG capsule Take 200 mg by mouth 2 (two) times daily.   Past Week at Unknown time  . levothyroxine (SYNTHROID, LEVOTHROID) 75 MCG tablet Take 1 tablet (75 mcg total) by mouth daily before breakfast. 90 tablet 0 10/20/2018 at Unknown time  . nicotine polacrilex (NICORETTE) 4 MG gum Take 2 mg by mouth as needed.    Past Week at Unknown time  . psyllium (METAMUCIL) 58.6 % packet Take 1 packet by mouth daily.     Past Week at Unknown time  . benzonatate (TESSALON) 100 MG capsule Take 1 capsule (100 mg total) by mouth 3 (three) times daily as needed for cough. 30 capsule 0  at prn  . Cholecalciferol (VITAMIN D) 2000 units CAPS Take by mouth.   Not Taking at Unknown time  . CLINDAMYCIN HCL PO Take by mouth. For dental procedures    Not Taking at Unknown time  . doxycycline (VIBRA-TABS) 100 MG tablet Take 1 tablet (100 mg total) by mouth 2 (two) times daily. (Patient not taking: Reported on 10/21/2018) 20 tablet 0 Not Taking at Unknown time  . etodolac (LODINE) 300 MG capsule Take 300 mg by mouth as needed.   Taking  . fish oil-omega-3 fatty acids 1000 MG capsule Take 2 g by mouth daily.   Not Taking at Unknown time  . guaiFENesin-codeine 100-10 MG/5ML syrup Take 10 mLs by mouth every 6 (six) hours as needed for cough. 120 mL 0  at prn  . ibuprofen (ADVIL,MOTRIN) 200 MG tablet Take 200 mg by mouth as needed.     Taking  . levothyroxine (SYNTHROID, LEVOTHROID) 75 MCG tablet TAKE 1 TABLET (75 MCG TOTAL) BY MOUTH DAILY BEFORE BREAKFAST. 90 tablet 1 Taking   Allergies  Allergen Reactions  .  Penicillins   . Butenafine Rash    Morbilliform rash coverin entire body  June 2014  . Lamisil [Terbinafine Hcl] Rash  . Terbinafine And Related Rash    Morbilliform rash coverin entire body  June 2014   Past Medical History:  Diagnosis Date  . Borderline osteopenia    prior DEXA at [facility]   . Colon polyp 2009   next one due 2012  . 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 History of kidney stones   . Kidney stones   . Neutropenia    no infections,  bone marrow biopsy by [name] next week  . renal calculi    s/p lithotripsy, [place]  . Thyroid disease    hypothyroidism   Past Surgical History:  Procedure Laterality Date  . COLONOSCOPY  03/23/2010   Diverticulosis  . COLONOSCOPY W/ BIOPSIES  03/14/2006   Tubular adenoma of the rectum x2, upper lesion with focal high-grade dysplasia.  Polyps 9 mm in size.  Marland Kitchen DILATION AND CURETTAGE OF UTERUS    . JOINT REPLACEMENT  2005   done in [place]  . kidney stones removal    . MELANOMA EXCISION  06/29/2016  . right hip replacement     Social History   Socioeconomic  History  . Marital status: Divorced    Spouse name: Not on file  . Number of children: 2  . Years of education: Not on file  . Highest education level: Not on file  Occupational History  . Occupation: Therapist, nutritional: [employer]  Social Needs  . Financial resource strain: Not on file  . Food insecurity:    Worry: Not on file    Inability: Not on file  . Transportation needs:    Medical: Not on file    Non-medical: Not on file  Tobacco Use  . Smoking status: Former Smoker    Types: Cigarettes    Last attempt to quit: 09/24/2000    Years since quitting: 18.0  . Smokeless tobacco: Never Used  Substance and Sexual Activity  . Alcohol use: Yes    Comment: "weekends"  . Drug use: No  . Sexual activity: Not Currently  Lifestyle  . Physical activity:    Days per week: Not on file    Minutes per session: Not  on file  . Stress: Not on file  Relationships  . Social connections:    Talks on phone: Not on file    Gets together: Not on file    Attends religious service: Not on file    Active member of club or organization: Not on file    Attends meetings of clubs or organizations: Not on file    Relationship status: Not on file  . Intimate partner violence:    Fear of current or ex partner: Not on file    Emotionally abused: Not on file    Physically abused: Not on file    Forced sexual activity: Not on file  Other Topics Concern  . Not on file  Social History Narrative   Patient lived most her her life in [place] and moved to [place] to help care for aging mother   Social History   Social History Narrative   Patient lived most her her life in [place] and moved to [place] to help care for aging mother     ROS: Negative.     PE: HEENT: Negative. Lungs: Clear. Cardio: RR.  Assessment/Plan:  Proceed with planned endoscopy.   Forest Gleason Tish Begin 10/21/2018   Assessment/Plan:  Proceed with planned endoscopy.

## 2018-10-22 ENCOUNTER — Telehealth: Payer: Self-pay | Admitting: *Deleted

## 2018-10-22 ENCOUNTER — Telehealth: Payer: Self-pay

## 2018-10-22 DIAGNOSIS — Z79899 Other long term (current) drug therapy: Secondary | ICD-10-CM

## 2018-10-22 LAB — SURGICAL PATHOLOGY

## 2018-10-22 MED ORDER — CELECOXIB 200 MG PO CAPS: 200.0000 mg | ORAL_CAPSULE | Freq: Two times a day (BID) | ORAL | 2 refills | Status: DC

## 2018-10-22 NOTE — Telephone Encounter (Signed)
cmet ordered and medication refilled. no appt needed

## 2018-10-22 NOTE — Telephone Encounter (Signed)
-----   Message from Robert Bellow, MD sent at 10/22/2018 11:34 AM EDT -----  Please let the patient know the polyp was fine, but she should plan on a repeat exam in five years. Place in recalls. Thanks.  ----- Message ----- From: Interface, Lab In Three Zero One Sent: 10/22/2018  10:49 AM EDT To: Robert Bellow, MD

## 2018-10-22 NOTE — Telephone Encounter (Signed)
Copied from [place] [id number]. Topic: General - Other >> Oct 22, 2018  2:05 PM Bea Graff, NT wrote: Reason for CRM: Pt states she would like a lab order to test her kidney function in order to get a refill of Celebrex. A Dr. Marylee Floras advised her to get this med refilled with her PCP. Please advise pt if appt with PCP is needed first.

## 2018-10-22 NOTE — Telephone Encounter (Signed)
Notified patient as instructed, patient pleased. Discussed follow-up appointments, patient agrees  

## 2018-10-22 NOTE — Telephone Encounter (Signed)
Is it okay to order a CMP?

## 2018-10-26 NOTE — Telephone Encounter (Signed)
Lab appt is scheduled for 11/03/2018

## 2018-10-29 ENCOUNTER — Encounter: Payer: Self-pay | Admitting: General Surgery

## 2018-11-03 ENCOUNTER — Other Ambulatory Visit (INDEPENDENT_AMBULATORY_CARE_PROVIDER_SITE_OTHER): Payer: Medicare Other

## 2018-11-03 DIAGNOSIS — Z79899 Other long term (current) drug therapy: Secondary | ICD-10-CM

## 2018-11-03 LAB — COMPREHENSIVE METABOLIC PANEL
ALT: 13 U/L (ref 0–35)
AST: 18 U/L (ref 0–37)
Albumin: 4.2 g/dL (ref 3.5–5.2)
Alkaline Phosphatase: 61 U/L (ref 39–117)
BILIRUBIN TOTAL: 0.5 mg/dL (ref 0.2–1.2)
BUN: 26 mg/dL — AB (ref 6–23)
CO2: 28 meq/L (ref 19–32)
Calcium: 9 mg/dL (ref 8.4–10.5)
Chloride: 103 mEq/L (ref 96–112)
Creatinine, Ser: 0.71 mg/dL (ref 0.40–1.20)
GFR: 86.96 mL/min (ref >60.00)
Glucose, Bld: 94 mg/dL (ref 70–99)
Potassium: 3.6 mEq/L (ref 3.5–5.1)
SODIUM: 137 meq/L (ref 135–145)
Total Protein: 6.5 g/dL (ref 6.0–8.3)

## 2018-11-12 DIAGNOSIS — F341 Dysthymic disorder: Secondary | ICD-10-CM | POA: Diagnosis not present

## 2018-11-24 ENCOUNTER — Encounter
Admission: RE | Admit: 2018-11-24 | Discharge: 2018-11-24 | Disposition: A | Discharge status: Home / Self Care | Payer: Medicare Other | Source: Ambulatory Visit | Attending: Surgery | Admitting: Surgery

## 2018-11-24 ENCOUNTER — Other Ambulatory Visit: Payer: Self-pay

## 2018-11-24 DIAGNOSIS — R001 Bradycardia, unspecified: Secondary | ICD-10-CM | POA: Insufficient documentation

## 2018-11-24 DIAGNOSIS — Z01818 Encounter for other preprocedural examination: Secondary | ICD-10-CM | POA: Diagnosis not present

## 2018-11-24 DIAGNOSIS — Z01810 Encounter for preprocedural cardiovascular examination: Secondary | ICD-10-CM | POA: Diagnosis not present

## 2018-11-24 HISTORY — DX: Malignant (primary) neoplasm, unspecified: C80.1

## 2018-11-24 HISTORY — DX: Hypothyroidism, unspecified: E03.9

## 2018-11-24 LAB — CBC
HEMATOCRIT: 38.9 % (ref 36.0–46.0)
Hemoglobin: 12.5 g/dL (ref 12.0–15.0)
MCH: 31.3 pg (ref 26.0–34.0)
MCHC: 32.1 g/dL (ref 30.0–36.0)
MCV: 97.5 fL (ref 80.0–100.0)
NRBC: 0 % (ref 0.0–0.2)
Platelets: 149 10*3/uL — ABNORMAL LOW (ref 150–400)
RBC: 3.99 MIL/uL (ref 3.87–5.11)
RDW: 12.5 % (ref 11.5–15.5)
WBC: 2.9 10*3/uL — ABNORMAL LOW (ref 4.0–10.5)

## 2018-11-24 NOTE — Patient Instructions (Signed)
Your procedure is scheduled on: Tuesday 12/01/18 Report to [facility]. To find out your arrival time please call [phone] between 1PM - 3PM on Monday 11/30/18.  Remember: Instructions that are not followed completely may result in serious medical risk, up to and including death, or upon the discretion of your surgeon and anesthesiologist your surgery may need to be rescheduled.     _X__ 1. Do not eat food after midnight the night before your procedure.                 No gum chewing or hard candies. You may drink clear liquids up to 2 hours                 before you are scheduled to arrive for your surgery- DO not drink clear                 liquids within 2 hours of the start of your surgery.                 Clear Liquids include:  water, apple juice without pulp, clear carbohydrate                 drink such as Clearfast or Gatorade, Black Coffee or Tea (Do not add                 anything to coffee or tea).  __X__2.  On the morning of surgery brush your teeth with toothpaste and water, you                 may rinse your mouth with mouthwash if you wish.  Do not swallow any              toothpaste of mouthwash.     _X__ 3.  No Alcohol for 24 hours before or after surgery.   _X__ 4.  Do Not Smoke or use e-cigarettes For 24 Hours Prior to Your Surgery.                 Do not use any chewable tobacco products for at least 6 hours prior to                 surgery.  ____  5.  Bring all medications with you on the day of surgery if instructed.   __X__  6.  Notify your doctor if there is any change in your medical condition      (cold, fever, infections).     Do not wear jewelry, make-up, hairpins, clips or nail polish. Do not wear lotions, powders, or perfumes.  Do not shave 48 hours prior to surgery. Men may shave face and neck. Do not bring valuables to the hospital.    [facility] is not responsible for any belongings or  valuables.  Contacts, dentures/partials or body piercings may not be worn into surgery. Bring a case for your contacts, glasses or hearing aids, a denture cup will be supplied. Leave your suitcase in the car. After surgery it may be brought to your room. For patients admitted to the hospital, discharge time is determined by your treatment team.   Patients discharged the day of surgery will not be allowed to drive home.   Please read over the following fact sheets that you were given:   MRSA Information  __X__ Take these medicines the morning of surgery with A SIP OF WATER:  1. levothyroxine (SYNTHROID, LEVOTHROID)   2.   3.   4.  5.  6.  ____ Fleet Enema (as directed)   __X__ Use CHG Soap/SAGE wipes as directed  ____ Use inhalers on the day of surgery  ____ Stop metformin/Janumet/Farxiga 2 days prior to surgery    ____ Take 1/2 of usual insulin dose the night before surgery. No insulin the morning          of surgery.   ____ Stop Blood Thinners Coumadin/Plavix/Xarelto/Pleta/Pradaxa/Eliquis/Effient/Aspirin  on   Or contact your Surgeon, Cardiologist or Medical Doctor regarding  ability to stop your blood thinners  __X__ Stop Anti-inflammatories 7 days before surgery such as Advil, Ibuprofen, Motrin,  BC or Goodies Powder, Naprosyn, Naproxen, Aleve, Aspirin YOU DO NOT HAVE TO STOP THE CELEBREX, JUST DO NOT TAKE THE MORNING OF SURGERY   __X__ Stop all herbal supplements, fish oil or vitamin E until after surgery. 7 DAYS PRIOR   ____ Bring C-Pap to the hospital.

## 2018-11-25 ENCOUNTER — Telehealth: Payer: Self-pay

## 2018-11-25 ENCOUNTER — Telehealth: Payer: Self-pay | Admitting: Internal Medicine

## 2018-11-25 NOTE — Telephone Encounter (Signed)
I am not willing to clear her when she has first degree av block on her ekg,  She will need to postpone surgery unles one of the [facility] cardiologists will see her and clear her

## 2018-11-25 NOTE — Telephone Encounter (Signed)
Copied from [place] [phone]. Topic: Quick Communication - See Telephone Encounter >> Nov 25, 2018 11:18 AM Rayann Heman wrote: CRM for notification. See Telephone encounter for: 11/25/18. Pt called and stated that she will be having surgery on 12/01/18 and will need clearance. Please advise

## 2018-11-25 NOTE — Telephone Encounter (Signed)
Spoke with patient she is going to be having left shoulder rotator cuff surgery on 12/01/18.  EKG  , bloodwork done 11/24/18 at [facility]  and they requested Dr Derrel Nip review EKG. They were to fax abnormal EKG over for her review or does she need more testing. ? Does she need pre-op appointment in order for you to clear patient ? Spoke to [place] at [place] /Dr. Poggi's office [phone] I advised Tiffany usually patient must come in for pre-op?

## 2018-11-25 NOTE — Pre-Procedure Instructions (Signed)
FAXED AND MESSAGED DR POGGI EKG/REQUEST TO CLEAR PREOP

## 2018-11-26 ENCOUNTER — Telehealth: Payer: Self-pay | Admitting: Internal Medicine

## 2018-11-26 NOTE — Telephone Encounter (Signed)
Dr Rockey Situ reviewed patient's  EKG and feels that that the changes are minimal and should not have any effect on her perioperative risk .  I sent Dr Roland Rack  a secure chat to let him know that she is cleared for shoulder surgery,  And he responded,  So to avoid any confusion caused by [place] leaving  a voice mail on  Friday afternoon to the contrary, please let his nurse know on Monday  That we spoke and [place] . patient has been notified via my chart

## 2018-11-29 ENCOUNTER — Telehealth: Payer: Self-pay | Admitting: Internal Medicine

## 2018-11-30 ENCOUNTER — Telehealth: Payer: Self-pay | Admitting: Cardiovascular Disease

## 2018-11-30 MED ORDER — CLINDAMYCIN PHOSPHATE 900 MG/50ML IV SOLN
900.0000 mg | Freq: Once | INTRAVENOUS | Status: AC
  Administered 2018-12-01: 900 mg via INTRAVENOUS

## 2018-11-30 NOTE — Telephone Encounter (Signed)
° °  [facility] Pre-operative Risk Assessment    Request for surgical clearance:  1. What type of surgery is being performed? L Shoulder Arthroscopy w/ debridement decompression and repair   2. When is this surgery scheduled? 12/28/18  3. What type of clearance is required (medical clearance vs. Pharmacy clearance to hold med vs. Both)? Medical   4. Are there any medications that need to be held prior to surgery and how long? Not noted   5. Practice name and name of physician performing surgery?  [facility] Dr. Roland Rack  / [facility] pre admit   6. What is your office phone number [phone]   7.   What is your office fax number [phone] and [phone]   8.   Anesthesia type (None, local, MAC, general) ? Not noted    Clarisse Gouge 11/30/2018, 11:30 AM  _________________________________________________________________   (provider comments below)

## 2018-11-30 NOTE — Telephone Encounter (Signed)
Faxed response to [facility] and poggi office   Removed from nurse box

## 2018-11-30 NOTE — Telephone Encounter (Signed)
Patient has not been seen in our office. Patient would need appointment for clearance if that is what is needed.

## 2018-11-30 NOTE — Telephone Encounter (Signed)
See other message in regards to surgical clearance from Dr. Derrel Nip.

## 2018-12-01 ENCOUNTER — Encounter: Payer: Self-pay | Admitting: Emergency Medicine

## 2018-12-01 ENCOUNTER — Ambulatory Visit
Admission: RE | Admit: 2018-12-01 | Discharge: 2018-12-01 | Disposition: A | Discharge status: Home / Self Care | Payer: Medicare Other | Source: Ambulatory Visit | Attending: Surgery | Admitting: Surgery

## 2018-12-01 ENCOUNTER — Ambulatory Visit: Payer: Medicare Other | Admitting: Anesthesiology

## 2018-12-01 ENCOUNTER — Encounter
Admission: RE | Disposition: A | Discharge status: Home / Self Care | Payer: Self-pay | Source: Ambulatory Visit | Attending: Surgery

## 2018-12-01 ENCOUNTER — Other Ambulatory Visit: Payer: Self-pay

## 2018-12-01 DIAGNOSIS — Z79899 Other long term (current) drug therapy: Secondary | ICD-10-CM | POA: Diagnosis not present

## 2018-12-01 DIAGNOSIS — M75122 Complete rotator cuff tear or rupture of left shoulder, not specified as traumatic: Secondary | ICD-10-CM | POA: Diagnosis not present

## 2018-12-01 DIAGNOSIS — M7542 Impingement syndrome of left shoulder: Secondary | ICD-10-CM | POA: Insufficient documentation

## 2018-12-01 DIAGNOSIS — M25512 Pain in left shoulder: Secondary | ICD-10-CM | POA: Diagnosis not present

## 2018-12-01 DIAGNOSIS — S46012A Strain of muscle(s) and tendon(s) of the rotator cuff of left shoulder, initial encounter: Secondary | ICD-10-CM | POA: Diagnosis not present

## 2018-12-01 DIAGNOSIS — M24112 Other articular cartilage disorders, left shoulder: Secondary | ICD-10-CM | POA: Diagnosis not present

## 2018-12-01 DIAGNOSIS — M7582 Other shoulder lesions, left shoulder: Secondary | ICD-10-CM | POA: Diagnosis not present

## 2018-12-01 DIAGNOSIS — G8918 Other acute postprocedural pain: Secondary | ICD-10-CM | POA: Diagnosis not present

## 2018-12-01 HISTORY — PX: SHOULDER ARTHROSCOPY WITH OPEN ROTATOR CUFF REPAIR: SHX6092

## 2018-12-01 SURGERY — ARTHROSCOPY, SHOULDER WITH REPAIR, ROTATOR CUFF, OPEN
Anesthesia: General | Site: Shoulder | Laterality: Left

## 2018-12-01 MED ORDER — FAMOTIDINE 20 MG PO TABS
ORAL_TABLET | ORAL | Status: AC
  Filled 2018-12-01: qty 1

## 2018-12-01 MED ORDER — BUPIVACAINE LIPOSOME 1.3 % IJ SUSP
INTRAMUSCULAR | Status: AC
  Filled 2018-12-01: qty 20

## 2018-12-01 MED ORDER — PHENYLEPHRINE HCL 10 MG/ML IJ SOLN
INTRAVENOUS | Status: DC | PRN
  Administered 2018-12-01: 30 ug/min via INTRAVENOUS

## 2018-12-01 MED ORDER — MIDAZOLAM HCL 2 MG/2ML IJ SOLN
INTRAMUSCULAR | Status: AC
  Filled 2018-12-01: qty 2

## 2018-12-01 MED ORDER — PROPOFOL 10 MG/ML IV BOLUS
INTRAVENOUS | Status: AC
  Filled 2018-12-01: qty 20

## 2018-12-01 MED ORDER — OXYCODONE HCL 5 MG PO TABS: 5.0000 mg | ORAL_TABLET | Freq: Once | ORAL | Status: DC | PRN

## 2018-12-01 MED ORDER — FENTANYL CITRATE (PF) 100 MCG/2ML IJ SOLN
INTRAMUSCULAR | Status: DC | PRN
  Administered 2018-12-01: 50 ug via INTRAVENOUS

## 2018-12-01 MED ORDER — ROCURONIUM BROMIDE 100 MG/10ML IV SOLN
INTRAVENOUS | Status: DC | PRN
  Administered 2018-12-01: 70 mg via INTRAVENOUS

## 2018-12-01 MED ORDER — ONDANSETRON HCL 4 MG PO TABS: 4.0000 mg | ORAL_TABLET | Freq: Four times a day (QID) | ORAL | Status: DC | PRN

## 2018-12-01 MED ORDER — SUGAMMADEX SODIUM 200 MG/2ML IV SOLN
INTRAVENOUS | Status: DC | PRN
  Administered 2018-12-01: 200 mg via INTRAVENOUS

## 2018-12-01 MED ORDER — BUPIVACAINE HCL (PF) 0.5 % IJ SOLN
INTRAMUSCULAR | Status: AC
  Filled 2018-12-01: qty 10

## 2018-12-01 MED ORDER — PROPOFOL 10 MG/ML IV BOLUS
INTRAVENOUS | Status: DC | PRN
  Administered 2018-12-01: 120 mg via INTRAVENOUS

## 2018-12-01 MED ORDER — FENTANYL CITRATE (PF) 250 MCG/5ML IJ SOLN
INTRAMUSCULAR | Status: AC
  Filled 2018-12-01: qty 5

## 2018-12-01 MED ORDER — ONDANSETRON HCL 4 MG/2ML IJ SOLN
INTRAMUSCULAR | Status: DC | PRN
  Administered 2018-12-01: 4 mg via INTRAVENOUS

## 2018-12-01 MED ORDER — FENTANYL CITRATE (PF) 100 MCG/2ML IJ SOLN: 25.0000 ug | INTRAMUSCULAR | Status: DC | PRN

## 2018-12-01 MED ORDER — METOCLOPRAMIDE HCL 5 MG/ML IJ SOLN: 5.0000 mg | Freq: Three times a day (TID) | INTRAMUSCULAR | Status: DC | PRN

## 2018-12-01 MED ORDER — BUPIVACAINE-EPINEPHRINE 0.5% -1:200000 IJ SOLN
INTRAMUSCULAR | Status: DC | PRN
  Administered 2018-12-01: 30 mL

## 2018-12-01 MED ORDER — LIDOCAINE HCL (CARDIAC) PF 100 MG/5ML IV SOSY
PREFILLED_SYRINGE | INTRAVENOUS | Status: DC | PRN
  Administered 2018-12-01: 100 mg via INTRAVENOUS

## 2018-12-01 MED ORDER — OXYCODONE HCL 5 MG PO TABS: 5.0000 mg | ORAL_TABLET | ORAL | 0 refills | Status: DC | PRN

## 2018-12-01 MED ORDER — OXYCODONE HCL 5 MG PO TABS
5.0000 mg | ORAL_TABLET | ORAL | Status: DC | PRN
  Filled 2018-12-01: qty 2

## 2018-12-01 MED ORDER — PHENYLEPHRINE HCL 10 MG/ML IJ SOLN
INTRAMUSCULAR | Status: DC | PRN
  Administered 2018-12-01: 50 ug via INTRAVENOUS

## 2018-12-01 MED ORDER — LIDOCAINE HCL (PF) 1 % IJ SOLN
INTRAMUSCULAR | Status: DC | PRN
  Administered 2018-12-01: 3 mL

## 2018-12-01 MED ORDER — CLINDAMYCIN PHOSPHATE 900 MG/50ML IV SOLN
INTRAVENOUS | Status: AC
  Filled 2018-12-01: qty 50

## 2018-12-01 MED ORDER — SUGAMMADEX SODIUM 200 MG/2ML IV SOLN
INTRAVENOUS | Status: AC
  Filled 2018-12-01: qty 2

## 2018-12-01 MED ORDER — MIDAZOLAM HCL 2 MG/2ML IJ SOLN
INTRAMUSCULAR | Status: AC
  Administered 2018-12-01: 1 mg via INTRAVENOUS
  Filled 2018-12-01: qty 2

## 2018-12-01 MED ORDER — LACTATED RINGERS IV SOLN
INTRAVENOUS | Status: DC
  Administered 2018-12-01: 11:00:00 via INTRAVENOUS

## 2018-12-01 MED ORDER — METOCLOPRAMIDE HCL 10 MG PO TABS: 5.0000 mg | ORAL_TABLET | Freq: Three times a day (TID) | ORAL | Status: DC | PRN

## 2018-12-01 MED ORDER — LACTATED RINGERS IV SOLN
INTRAVENOUS | Status: DC | PRN
  Administered 2018-12-01: 1 mL

## 2018-12-01 MED ORDER — FAMOTIDINE 20 MG PO TABS
20.0000 mg | ORAL_TABLET | Freq: Once | ORAL | Status: AC
  Administered 2018-12-01: 20 mg via ORAL

## 2018-12-01 MED ORDER — MIDAZOLAM HCL 2 MG/2ML IJ SOLN
1.0000 mg | Freq: Once | INTRAMUSCULAR | Status: AC
  Administered 2018-12-01: 1 mg via INTRAVENOUS

## 2018-12-01 MED ORDER — MIDAZOLAM HCL 2 MG/2ML IJ SOLN
INTRAMUSCULAR | Status: DC | PRN
  Administered 2018-12-01: 2 mg via INTRAVENOUS

## 2018-12-01 MED ORDER — MEPERIDINE HCL 50 MG/ML IJ SOLN: 6.2500 mg | INTRAMUSCULAR | Status: DC | PRN

## 2018-12-01 MED ORDER — BUPIVACAINE LIPOSOME 1.3 % IJ SUSP
INTRAMUSCULAR | Status: DC | PRN
  Administered 2018-12-01: 20 mL via PERINEURAL

## 2018-12-01 MED ORDER — SODIUM CHLORIDE FLUSH 0.9 % IV SOLN
INTRAVENOUS | Status: AC
  Filled 2018-12-01: qty 10

## 2018-12-01 MED ORDER — FENTANYL CITRATE (PF) 100 MCG/2ML IJ SOLN
INTRAMUSCULAR | Status: AC
  Administered 2018-12-01: 50 ug via INTRAVENOUS
  Filled 2018-12-01: qty 2

## 2018-12-01 MED ORDER — DEXAMETHASONE SODIUM PHOSPHATE 4 MG/ML IJ SOLN
INTRAMUSCULAR | Status: DC | PRN
  Administered 2018-12-01: 6 mg via INTRAVENOUS

## 2018-12-01 MED ORDER — PROMETHAZINE HCL 25 MG/ML IJ SOLN: 6.2500 mg | INTRAMUSCULAR | Status: DC | PRN

## 2018-12-01 MED ORDER — OXYCODONE HCL 5 MG/5ML PO SOLN: 5.0000 mg | Freq: Once | ORAL | Status: DC | PRN

## 2018-12-01 MED ORDER — BUPIVACAINE HCL (PF) 0.5 % IJ SOLN
INTRAMUSCULAR | Status: DC | PRN
  Administered 2018-12-01: 10 mL via PERINEURAL

## 2018-12-01 MED ORDER — CLINDAMYCIN PHOSPHATE 600 MG/50ML IV SOLN
INTRAVENOUS | Status: AC
  Filled 2018-12-01: qty 50

## 2018-12-01 MED ORDER — BUPIVACAINE-EPINEPHRINE (PF) 0.5% -1:200000 IJ SOLN
INTRAMUSCULAR | Status: AC
  Filled 2018-12-01: qty 30

## 2018-12-01 MED ORDER — GLYCOPYRROLATE 0.2 MG/ML IJ SOLN
INTRAMUSCULAR | Status: DC | PRN
  Administered 2018-12-01: 0.2 mg via INTRAVENOUS

## 2018-12-01 MED ORDER — ONDANSETRON HCL 4 MG/2ML IJ SOLN: 4.0000 mg | Freq: Four times a day (QID) | INTRAMUSCULAR | Status: DC | PRN

## 2018-12-01 MED ORDER — LIDOCAINE HCL (PF) 2 % IJ SOLN
INTRAMUSCULAR | Status: AC
  Filled 2018-12-01: qty 10

## 2018-12-01 MED ORDER — FENTANYL CITRATE (PF) 100 MCG/2ML IJ SOLN
50.0000 ug | Freq: Once | INTRAMUSCULAR | Status: AC
  Administered 2018-12-01: 50 ug via INTRAVENOUS

## 2018-12-01 MED ORDER — LIDOCAINE HCL (PF) 1 % IJ SOLN
INTRAMUSCULAR | Status: AC
  Filled 2018-12-01: qty 5

## 2018-12-01 MED ORDER — ROCURONIUM BROMIDE 50 MG/5ML IV SOLN
INTRAVENOUS | Status: AC
  Filled 2018-12-01: qty 2

## 2018-12-01 MED ORDER — POTASSIUM CHLORIDE IN NACL 20-0.9 MEQ/L-% IV SOLN
INTRAVENOUS | Status: DC
  Filled 2018-12-01 (×4): qty 1000

## 2018-12-01 MED ORDER — EPINEPHRINE PF 1 MG/ML IJ SOLN
INTRAMUSCULAR | Status: AC
  Filled 2018-12-01: qty 2

## 2018-12-01 SURGICAL SUPPLY — 45 items
ANCHOR JUGGERKNOT WTAP NDL 2.9 (Anchor) ×2 IMPLANT
ANCHOR SUT QUATTRO KNTLS 4.5 (Anchor) ×2 IMPLANT
BIT DRILL JUGRKNT W/NDL BIT2.9 (DRILL) IMPLANT
BLADE FULL RADIUS 3.5 (BLADE) ×3 IMPLANT
BUR ACROMIONIZER 4.0 (BURR) ×3 IMPLANT
CANNULA SHAVER 8MMX76MM (CANNULA) ×3 IMPLANT
CHLORAPREP W/TINT 26ML (MISCELLANEOUS) ×3 IMPLANT
COVER MAYO STAND STRL (DRAPES) ×3 IMPLANT
COVER WAND RF STERILE (DRAPES) ×1 IMPLANT
DRAPE IMP U-DRAPE 54X76 (DRAPES) ×6 IMPLANT
DRILL JUGGERKNOT W/NDL BIT 2.9 (DRILL) ×3
ELECT REM PT RETURN 9FT ADLT (ELECTROSURGICAL) ×3
ELECTRODE REM PT RTRN 9FT ADLT (ELECTROSURGICAL) ×1 IMPLANT
GAUZE PETRO XEROFOAM 1X8 (MISCELLANEOUS) ×3 IMPLANT
GAUZE SPONGE 4X4 12PLY STRL (GAUZE/BANDAGES/DRESSINGS) ×3 IMPLANT
GLOVE BIO SURGEON STRL SZ7.5 (GLOVE) ×6 IMPLANT
GLOVE BIO SURGEON STRL SZ8 (GLOVE) ×6 IMPLANT
GLOVE BIOGEL PI IND STRL 8 (GLOVE) ×1 IMPLANT
GLOVE BIOGEL PI INDICATOR 8 (GLOVE) ×2
GLOVE INDICATOR 8.0 STRL GRN (GLOVE) ×3 IMPLANT
GOWN STRL REUS W/ TWL LRG LVL3 (GOWN DISPOSABLE) ×1 IMPLANT
GOWN STRL REUS W/ TWL XL LVL3 (GOWN DISPOSABLE) ×1 IMPLANT
GOWN STRL REUS W/TWL LRG LVL3 (GOWN DISPOSABLE) ×2
GOWN STRL REUS W/TWL XL LVL3 (GOWN DISPOSABLE) ×2
GRASPER SUT 15 45D LOW PRO (SUTURE) IMPLANT
IV LACTATED RINGER IRRG 3000ML (IV SOLUTION) ×2
IV LR IRRIG 3000ML ARTHROMATIC (IV SOLUTION) ×2 IMPLANT
MANIFOLD NEPTUNE II (INSTRUMENTS) ×3 IMPLANT
MASK FACE SPIDER DISP (MASK) ×1 IMPLANT
MAT ABSORB  FLUID 56X50 GRAY (MISCELLANEOUS) ×2
MAT ABSORB FLUID 56X50 GRAY (MISCELLANEOUS) ×1 IMPLANT
PACK ARTHROSCOPY SHOULDER (MISCELLANEOUS) ×3 IMPLANT
SLING ARM LRG DEEP (SOFTGOODS) ×1 IMPLANT
SLING ULTRA II LG (MISCELLANEOUS) ×3 IMPLANT
STAPLER SKIN PROX 35W (STAPLE) ×3 IMPLANT
STRAP SAFETY 5IN WIDE (MISCELLANEOUS) ×3 IMPLANT
SUT ETHIBOND 0 MO6 C/R (SUTURE) ×3 IMPLANT
SUT VIC AB 2-0 CT1 27 (SUTURE) ×4
SUT VIC AB 2-0 CT1 TAPERPNT 27 (SUTURE) ×2 IMPLANT
TAPE MICROFOAM 4IN (TAPE) ×3 IMPLANT
TUBING ARTHRO INFLOW-ONLY STRL (TUBING) ×3 IMPLANT
TUBING CONNECTING 10 (TUBING) ×2 IMPLANT
TUBING CONNECTING 10' (TUBING) ×1
WAND HAND CNTRL MULTIVAC 90 (MISCELLANEOUS) ×1 IMPLANT
WAND WEREWOLF FLOW 90D (MISCELLANEOUS) ×2 IMPLANT

## 2018-12-01 NOTE — H&P (Signed)
Paper H&P to be scanned into permanent record. H&P reviewed and patient re-examined. No changes. 

## 2018-12-01 NOTE — OR Nursing (Signed)
Pt in post op complaining of eye irritation right under upper  eye lid. Flushed with 10 ml of normal saline states irritation has improved. 10 minutes later states much better but feels some irritation lower lid flushed again with 10 ml of normal saline. Pt has been educated on the importance of not scratching her eye. Pt states irritation has improved. Nsg getting her ready for discharge.

## 2018-12-01 NOTE — Op Note (Signed)
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  Post-Op Diagnosis:   Impingement/tendinopathy with traumatic full-thickness rotator cuff tear, labral fraying, and synovitis, left shoulder..  Procedure:   Limited arthroscopic debridement, arthroscopic subacromial decompression, and mini-open rotator cuff repair, left shoulder.  Anesthesia:   General endotracheal with interscalene block using Exparel placed preoperatively by the anesthesiologist.  Surgeon:   Pascal Lux, MD  Assistant:   Cameron Proud, PA-C  Findings:   As above.  There was degenerative labral fraying anteriorly and superiorly without frank detachment of the labrum from the glenoid.  There was extensive synovitis anteriorly, superiorly, and postero-superiorly.  There were grade 1-2 chondromalacial changes involving the central portion of the glenoid, with the remainder of the articular surfaces all were in excellent condition.  There was a full-thickness tear involving the mid and posterior portions of the supraspinatus tendon with a delaminating component laterally.  The tear measured approximately 1.2 to 1.4 cm in the anterior-posterior dimension and approximately 2.5 cm in the medial-lateral dimension.  The remainder of the rotator cuff was in excellent condition.  The biceps tendon also was in satisfactory condition.  Complications:   None  Fluids:   700 cc  Estimated blood loss:   5 cc  Tourniquet time:   None  Drains:   None  Closure:   Staples      Brief clinical note:   The patient is a 67 year old female with a 9+ month history of left shoulder pain. The patient's symptoms have progressed despite medications, activity modification, etc. The patient's history and examination are consistent with impingement/tendinopathy with a rotator cuff tear. These findings were confirmed by MRI scan.  The patient presents at this time for definitive management of these shoulder symptoms.  Procedure:   The patient underwent placement of an interscalene block using Exparel by the anesthesiologist in the preoperative holding area before being brought into the operating room and lain in the supine position. The patient then underwent general endotracheal intubation and anesthesia before being repositioned in the beach chair position using the beach chair positioner. The left shoulder and upper extremity were prepped with ChloraPrep solution before being draped sterilely. Preoperative antibiotics were administered. A timeout was performed to confirm the proper surgical site before the expected portal sites and incision site were injected with 0.5% Sensorcaine with epinephrine. A posterior portal was created and the glenohumeral joint thoroughly inspected with the findings as described above. An anterior portal was created using an outside-in technique. The labrum and rotator cuff were further probed, again confirming the above-noted findings. The areas of labral fraying were debrided back to stable margins with the full-radius resector, as were areas of extensive synovitis anteriorly, superiorly, and postero-superiorly. The ArthroCare wand was inserted and used to obtain hemostasis as well as to "anneal" the labrum superiorly and anteriorly. The instruments were removed from the joint after suctioning the excess fluid.  The camera was repositioned through the posterior portal into the subacromial space. A separate lateral portal was created using an outside-in technique. The 3.5 mm full-radius resector was introduced and used to perform a subtotal bursectomy. The ArthroCare wand was then inserted and used to remove the periosteal tissue off the undersurface of the anterior third of the acromion as well as to recess the coracoacromial ligament from its attachment along the anterior and lateral margins of the  acromion. The 4.0 mm acromionizing bur  was introduced and used to complete the decompression by removing the undersurface of the anterior third of the acromion. The full radius resector was reintroduced to remove any residual bony debris before the ArthroCare wand was reintroduced to obtain hemostasis. The instruments were then removed from the subacromial space after suctioning the excess fluid.  An approximately 4-5 cm incision was made over the anterolateral aspect of the shoulder beginning at the anterolateral corner of the acromion and extending distally in line with the bicipital groove. This incision was carried down through the subcutaneous tissues to expose the deltoid fascia. The raphae between the anterior and middle thirds was identified and this plane developed to provide access into the subacromial space. Additional bursal tissues were debrided sharply using Metzenbaum scissors. The rotator cuff tear was readily identified. The margins were debrided sharply with a #15 blade and the exposed greater tuberosity roughened with a rongeur. The tear was repaired in a side-to-side fashion centrally using several #0 Ethibond interrupted sutures with care taken to reconnect the delaminated flap component posteriorly. Laterally, the tendon was repaired using one Biomet 2.9 mm JuggerKnot anchor. These sutures were then brought back laterally and secured using a single [employer] anchor to create a two-layer closure. An apparent watertight closure was obtained.  The wound was copiously irrigated with sterile saline solution before the deltoid raphae was reapproximated using 2-0 Vicryl interrupted sutures. The subcutaneous tissues were closed in two layers using 2-0 Vicryl interrupted sutures before the skin was closed using staples. The portal sites also were closed using staples. A sterile bulky dressing was applied to the shoulder before the arm was placed into a shoulder immobilizer. The patient was  then awakened, extubated, and returned to the recovery room in satisfactory condition after tolerating the procedure well.

## 2018-12-01 NOTE — Anesthesia Postprocedure Evaluation (Signed)
Anesthesia Post Note  Patient: Alexis Mcdonald [facility]  Procedure(s) Performed: SHOULDER ARTHROSCOPY WITH OPEN ROTATOR CUFF REPAIR (Left Shoulder)  Patient location during evaluation: PACU Anesthesia Type: General Level of consciousness: awake and alert and oriented Pain management: pain level controlled Vital Signs Assessment: post-procedure vital signs reviewed and stable Respiratory status: spontaneous breathing, nonlabored ventilation and respiratory function stable Cardiovascular status: blood pressure returned to baseline and stable Postop Assessment: no signs of nausea or vomiting Anesthetic complications: no     Last Vitals:  Vitals:   12/01/18 1519 12/01/18 1530  BP: 132/81   Pulse: 60 60  Resp: 12 15  Temp:    SpO2: 100% 98%    Last Pain:  Vitals:   12/01/18 1530  TempSrc:   PainSc: 0-No pain                 Danilynn Jemison

## 2018-12-01 NOTE — Anesthesia Procedure Notes (Signed)
Anesthesia Regional Block: Interscalene brachial plexus block   Pre-Anesthetic Checklist: ,, timeout performed, Correct Patient, Correct Site, Correct Laterality, Correct Procedure, Correct Position, site marked, Risks and benefits discussed,  Surgical consent,  Pre-op evaluation,  At surgeon's request and post-op pain management  Laterality: Left  Prep: chloraprep       Needles:  Injection technique: Single-shot  Needle Type: Stimiplex     Needle Length: 10cm  Needle Gauge: 21     Additional Needles:   Procedures:,,,, ultrasound used (permanent image in chart),,,,  Narrative:  Start time: 12/01/2018 12:55 PM End time: 12/01/2018 1:01 PM Injection made incrementally with aspirations every 5 mL.  Performed by: Personally  Anesthesiologist: Emmie Niemann, MD  Additional Notes: Functioning IV was confirmed and monitors were applied.  A Stimuplex needle was used. Sterile prep and drape,hand hygiene and sterile gloves were used.  Negative aspiration and negative test dose prior to incremental administration of local anesthetic. The patient tolerated the procedure well.

## 2018-12-01 NOTE — Anesthesia Preprocedure Evaluation (Signed)
Anesthesia Evaluation  Patient identified by MRN, date of birth, ID band Patient awake    Reviewed: Allergy & Precautions, NPO status , Patient's Chart, lab work & pertinent test results  History of Anesthesia Complications Negative for: history of anesthetic complications  Airway Mallampati: II  TM Distance: >3 FB Neck ROM: Full    Dental no notable dental hx.    Pulmonary neg sleep apnea, neg COPD, former smoker,    breath sounds clear to auscultation- rhonchi (-) wheezing      Cardiovascular Exercise Tolerance: Good (-) hypertension(-) CAD, (-) Past MI, (-) Cardiac Stents and (-) CABG  Rhythm:Regular Rate:Normal - Systolic murmurs and - Diastolic murmurs    Neuro/Psych negative neurological ROS  negative psych ROS   GI/Hepatic negative GI ROS, Neg liver ROS,   Endo/Other  neg diabetesHypothyroidism   Renal/GU Renal disease: hx of nephrolithiasis.     Musculoskeletal negative musculoskeletal ROS (+)   Abdominal (+) - obese,   Peds  Hematology negative hematology ROS (+)   Anesthesia Other Findings Past Medical History: No date: Borderline osteopenia     Comment:  prior DEXA at [facility]  No date: Cancer [employer])     Comment:  Melanoma removed from right hip 2009: Colon polyp     Comment:  next one due 2012 march 2012: Hemochromatosis carrier     Comment:  [name]  by March onc eval [name]) No date: History of kidney stones No date: Hypothyroidism No date: Kidney stones No date: Neutropenia     Comment:  no infections,  bone marrow biopsy by [name] next week No date: renal calculi     Comment:  s/p lithotripsy, [place] No date: Thyroid disease     Comment:  hypothyroidism   Reproductive/Obstetrics                             Anesthesia Physical Anesthesia Plan  ASA: II  Anesthesia Plan: General   Post-op Pain Management:  Regional for Post-op pain   Induction:  Intravenous  PONV Risk Score and Plan: 2  Airway Management Planned: Oral ETT  Additional Equipment:   Intra-op Plan:   Post-operative Plan: Extubation in OR  Informed Consent: I have reviewed the patients History and Physical, chart, labs and discussed the procedure including the risks, benefits and alternatives for the proposed anesthesia with the patient or authorized representative who has indicated his/her understanding and acceptance.   Dental advisory given  Plan Discussed with: CRNA and Anesthesiologist  Anesthesia Plan Comments:         Anesthesia Quick Evaluation

## 2018-12-01 NOTE — Transfer of Care (Signed)
Immediate Anesthesia Transfer of Care Note  Patient: Alexis Mcdonald [facility]  Procedure(s) Performed: SHOULDER ARTHROSCOPY WITH OPEN ROTATOR CUFF REPAIR (Left Shoulder)  Patient Location: PACU  Anesthesia Type:General  Level of Consciousness: awake, alert , oriented and patient cooperative  Airway & Oxygen Therapy: Patient Spontanous Breathing and Patient connected to nasal cannula oxygen  Post-op Assessment: Report given to RN and Post -op Vital signs reviewed and stable  Post vital signs: Reviewed and stable  Last Vitals:  Vitals Value Taken Time  BP    Temp    Pulse    Resp 10 12/01/2018  3:03 PM  SpO2    Vitals shown include unvalidated device data.  Last Pain:  Vitals:   12/01/18 1305  TempSrc:   PainSc: 0-No pain         Complications: No apparent anesthesia complications

## 2018-12-01 NOTE — Discharge Instructions (Addendum)
Orthopedic discharge instructions: Keep dressing dry and intact.  May shower after dressing changed on post-op day #4 (Saturday).  Cover staples with Band-Aids after drying off. Apply ice frequently to shoulder. Take Celebrex 200 mg daily OR ibuprofen 600-800 mg TID with meals for 7-10 days, then as necessary. Take oxycodone as prescribed when needed.  May supplement with ES Tylenol if necessary. Keep shoulder immobilizer on at all times except may remove for bathing purposes. Follow-up in 10-14 days or as scheduled.     Interscalene Nerve Block with Exparel  1.  For your surgery you have received an Interscalene Nerve Block with Exparel. 2. Nerve Blocks affect many types of nerves, including nerves that control movement, pain and normal sensation.  You may experience feelings such as numbness, tingling, heaviness, weakness or the inability to move your arm or the feeling or sensation that your arm has "fallen asleep". 3. A nerve block with Exparel can last up to 5 days.  Usually the weakness wears off first.  The tingling and heaviness usually wear off next.  Finally you may start to notice pain.  Keep in mind that this may occur in any order.  Once a nerve block starts to wear off it is usually completely gone within 60 minutes. 4. ISNB may cause mild shortness of breath, a hoarse voice, blurry vision, unequal pupils, or drooping of the face on the same side as the nerve block.  These symptoms will usually resolve with the numbness.  Very rarely the procedure itself can cause mild seizures. 5. If needed, your surgeon will give you a prescription for pain medication.  It will take about 60 minutes for the oral pain medication to become fully effective.  So, it is recommended that you start taking this medication before the nerve block first begins to wear off, or when you first begin to feel discomfort. 6. Take your pain medication only as prescribed.  Pain medication can cause sedation and  decrease your breathing if you take more than you need for the level of pain that you have. 7. Nausea is a common side effect of many pain medications.  You may want to eat something before taking your pain medicine to prevent nausea. 8. After an Interscalene nerve block, you cannot feel pain, pressure or extremes in temperature in the effected arm.  Because your arm is numb it is at an increased risk for injury.  To decrease the possibility of injury, please practice the following:  a. While you are awake change the position of your arm frequently to prevent too much pressure on any one area for prolonged periods of time. b.  If you have a cast or tight dressing, check the color or your fingers every couple of hours.  Call your surgeon with the appearance of any discoloration (white or blue). c. If you are given a sling to wear before you go home, please wear it  at all times until the block has completely worn off.  Do not get up at night without your sling. d. Please contact [facility] or your surgeon if you do not begin to regain sensation after 7 days from the surgery.  Anesthesia may be contacted by calling the Same Day Surgery Department, Mon. through Fri., 6 am to 4 pm at [phone].   e. If you experience any other problems or concerns, please contact your surgeon's office. f. If you experience severe or prolonged shortness of breath go to the nearest emergency department.  AMBULATORY SURGERY  DISCHARGE INSTRUCTIONS   1) The drugs that you were given will stay in your system until tomorrow so for the next 24 hours you should not:  A) Drive an automobile B) Make any legal decisions C) Drink any alcoholic beverage   2) You may resume regular meals tomorrow.  Today it is better to start with liquids and gradually work up to solid foods.  You may eat anything you prefer, but it is better to start with liquids, then soup and crackers, and gradually work up to solid  foods.   3) Please notify your doctor immediately if you have any unusual bleeding, trouble breathing, redness and pain at the surgery site, drainage, fever, or pain not relieved by medication.    4) Additional Instructions: TAKE A STOOL SOFTENER TWICE A DAY WHILE TAKING NARCOTIC PAIN MEDICINE TO PREVENT CONSTIPATION   Please contact your physician with any problems or Same Day Surgery at [phone], Monday through Friday 6 am to 4 pm, or [place] at [facility] number at [phone].

## 2018-12-01 NOTE — Anesthesia Post-op Follow-up Note (Signed)
Anesthesia QCDR form completed.        

## 2018-12-01 NOTE — Anesthesia Procedure Notes (Signed)
Procedure Name: Intubation Date/Time: 12/01/2018 1:36 PM Performed by: Bernardo Heater, CRNA Pre-anesthesia Checklist: Patient identified, Emergency Drugs available, Suction available and Patient being monitored Patient Re-evaluated:Patient Re-evaluated prior to induction Oxygen Delivery Method: Circle system utilized Preoxygenation: Pre-oxygenation with 100% oxygen Induction Type: IV induction Laryngoscope Size: Mac and 3 Grade View: Grade I Tube size: 7.0 mm Number of attempts: 1 Placement Confirmation: ETT inserted through vocal cords under direct vision,  positive ETCO2 and breath sounds checked- equal and bilateral Secured at: 21 cm Tube secured with: Tape Dental Injury: Teeth and Oropharynx as per pre-operative assessment

## 2018-12-02 ENCOUNTER — Encounter: Payer: Self-pay | Admitting: Surgery

## 2018-12-07 DIAGNOSIS — M25512 Pain in left shoulder: Secondary | ICD-10-CM | POA: Diagnosis not present

## 2018-12-14 DIAGNOSIS — M25512 Pain in left shoulder: Secondary | ICD-10-CM | POA: Diagnosis not present

## 2018-12-17 DIAGNOSIS — F341 Dysthymic disorder: Secondary | ICD-10-CM | POA: Diagnosis not present

## 2018-12-21 DIAGNOSIS — M25512 Pain in left shoulder: Secondary | ICD-10-CM | POA: Diagnosis not present

## 2019-01-01 DIAGNOSIS — M25512 Pain in left shoulder: Secondary | ICD-10-CM | POA: Diagnosis not present

## 2019-01-04 DIAGNOSIS — M25512 Pain in left shoulder: Secondary | ICD-10-CM | POA: Diagnosis not present

## 2019-01-12 DIAGNOSIS — M25512 Pain in left shoulder: Secondary | ICD-10-CM | POA: Diagnosis not present

## 2019-01-15 DIAGNOSIS — M25512 Pain in left shoulder: Secondary | ICD-10-CM | POA: Diagnosis not present

## 2019-01-19 DIAGNOSIS — M25512 Pain in left shoulder: Secondary | ICD-10-CM | POA: Diagnosis not present

## 2019-01-26 DIAGNOSIS — M25512 Pain in left shoulder: Secondary | ICD-10-CM | POA: Diagnosis not present

## 2019-01-28 DIAGNOSIS — F418 Other specified anxiety disorders: Secondary | ICD-10-CM | POA: Diagnosis not present

## 2019-02-03 DIAGNOSIS — M25512 Pain in left shoulder: Secondary | ICD-10-CM | POA: Diagnosis not present

## 2019-02-04 ENCOUNTER — Ambulatory Visit (INDEPENDENT_AMBULATORY_CARE_PROVIDER_SITE_OTHER): Payer: Medicare Other

## 2019-02-04 VITALS — BP 112/62 | HR 53 | Temp 98.0°F | Resp 14 | Ht 63.5 in | Wt 118.0 lb

## 2019-02-04 DIAGNOSIS — Z23 Encounter for immunization: Secondary | ICD-10-CM

## 2019-02-04 DIAGNOSIS — Z1159 Encounter for screening for other viral diseases: Secondary | ICD-10-CM

## 2019-02-04 DIAGNOSIS — Z0000 Encounter for general adult medical examination without abnormal findings: Secondary | ICD-10-CM

## 2019-02-04 NOTE — Patient Instructions (Addendum)
  [name] , Thank you for taking time to come for your Medicare Wellness Visit. I appreciate your ongoing commitment to your health goals. Please review the following plan we discussed and let me know if I can assist you in the future.   Follow up as needed.    Bring a copy of your [place] and/or Living Will to be scanned into chart.  Have a great day!  These are the goals we discussed: Goals    . Maintain Healthy Lifestyle       This is a list of the screening recommended for you and due dates:  Health Maintenance  Topic Date Due  .  Hepatitis C: One time screening is recommended by Center for Disease Control  (CDC) for  adults born from [birth year] through 1966.   01/14/1951  . Mammogram  09/24/2019  . Tetanus Vaccine  05/08/2023  . Colon Cancer Screening  10/21/2028  . Flu Shot  Completed  . DEXA scan (bone density measurement)  Completed  . Pneumonia vaccines  Completed

## 2019-02-04 NOTE — Progress Notes (Addendum)
Subjective:   Alexis Mcdonald is a 68 y.o. female who presents for Medicare Annual (Subsequent) preventive examination.  Review of Systems:  No ROS.  Medicare Wellness Visit. Additional risk factors are reflected in the social history. Cardiac Risk Factors include: advanced age (>52mn, >>79women);hypertension;smoking/ tobacco exposure     Objective:     Vitals: BP 112/62 (BP Location: Left Arm, Patient Position: Sitting, Cuff Size: Normal)   Pulse (!) 53   Temp 98 F (36.7 C) (Oral)   Resp 14   Ht 5' 3.5" (1.613 m)   Wt 118 lb (53.5 kg)   SpO2 97%   BMI 20.57 kg/m   Body mass index is 20.57 kg/m.  Advanced Directives 02/04/2019 12/01/2018 11/24/2018 10/21/2018 07/08/2018  Does Patient Have a Medical Advance Directive? _0   Type of AParamed[place]of [place] will [place] will [place] ArborLiving will [place]Living will Living will;Healthcare Power of Attorney  Does patient want to make changes to medical advance directive? No - Patient declined No - Patient declined No - Patient declined - Yes (MAU/Ambulatory/Procedural Areas - Information given)  Copy of [place] Chart? No - copy requested No - copy requested No - copy requested - Yes    Tobacco Social History   Tobacco Use  Smoking Status Former Smoker  . Types: Cigarettes  . Last attempt to quit: 09/24/2000  . Years since quitting: 18.3  Smokeless Tobacco Never Used     Counseling given: Not Answered   Clinical Intake:  Pre-visit preparation completed: Yes  Pain : No/denies pain     Diabetes: No  How often do you need to have someone help you when you read instructions, pamphlets, or other written materials from your doctor or pharmacy?: 1 - Never  Interpreter Needed?: No     Past Medical History:  Diagnosis Date  . Borderline osteopenia    [facility]  . Cancer ([place]    Melanoma removed from right hip  . Colon polyp 2009   next one due 2012  . 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 History of kidney stones   . Hypothyroidism   . Kidney stones   . Neutropenia    no infections,  bone marrow biopsy by [facility]  . renal calculi    s/p lithotripsy, [place]  . Thyroid disease    hypothyroidism   Past Surgical History:  Procedure Laterality Date  . COLONOSCOPY  03/23/2010   Diverticulosis  . COLONOSCOPY W/ BIOPSIES  03/14/2006   Tubular adenoma of the rectum x2, upper lesion with focal high-grade dysplasia.  Polyps 9 mm in size.  .Marland KitchenCOLONOSCOPY WITH PROPOFOL N/A 10/21/2018   Procedure: COLONOSCOPY WITH PROPOFOL;  Surgeon: BRobert Bellow MD;  Location: [facility] ENDOSCOPY;  Service: Endoscopy;  Laterality: N/A;  . DILATION AND CURETTAGE OF UTERUS    . JOINT [place]  [place]    . MELANOMA EXCISION  06/29/2016  . right hip replacement    . SHOULDER ARTHROSCOPY WITH OPEN ROTATOR CUFF REPAIR Left 12/01/2018   Procedure: SHOULDER ARTHROSCOPY WITH OPEN ROTATOR CUFF REPAIR;  Surgeon: PCorky Mull MD;  Location: [facility] ORS;  Service: Orthopedics;  Laterality: Left;   Family History  Problem Relation Age of Onset  . Diabetes Mother   .  Stroke Mother   . Hyperlipidemia Mother   . Hypertension Mother   . Alcohol abuse Father   . Diabetes Brother   . Alcohol abuse Brother   . Hypertension Brother   . Alcohol abuse Brother   . Heart disease Maternal Uncle   . Breast cancer Neg Hx    Social History   Socioeconomic History  . Marital status: Divorced    Spouse name: Not on file  . Number of children: 2  . Years of education: Not on file  . Highest education level: Not on file  Occupational History  . Occupation: Therapist, nutritional: [employer]  Social Needs  . Financial resource strain: Not hard at all  . Food insecurity:      Worry: Never true    Inability: Never true  . Transportation needs:    Medical: No    Non-medical: No  Tobacco Use  . Smoking status: Former Smoker    Types: Cigarettes    Last attempt to quit: 09/24/2000    Years since quitting: 18.3  . Smokeless tobacco: Never Used  Substance and Sexual Activity  . Alcohol use: Yes    Comment: "weekends"  . Drug use: No  . Sexual activity: Not Currently  Lifestyle  . Physical activity:    Days per week: 7 days    Minutes per session: 60 min  . Stress: Not at all  Relationships  . Social connections:    Talks on phone: Not on file    Gets together: Not on file    Attends religious service: Not on file    Active member of club or organization: Not on file    Attends meetings of clubs or organizations: Not on file    Relationship status: Not on file  Other Topics Concern  . Not on file  Social History Narrative   Patient lived most her her life in [place] and moved to [place] to help care for aging mother    Outpatient Encounter Medications as of 02/04/2019  Medication Sig  . ALPRAZolam (XANAX) 0.25 MG tablet Take 1 tablet (0.25 mg total) by mouth at bedtime as needed for sleep. (Patient taking differently: Take 0.125 mg by mouth at bedtime as needed for sleep. )  . Cholecalciferol (VITAMIN D) 2000 units CAPS Take 2,000 Units by mouth daily.   . fexofenadine-pseudoephedrine (ALLEGRA-D) 60-120 MG 12 hr tablet Take 1 tablet by mouth daily as needed (allergies).  . fish oil-omega-3 fatty acids 1000 MG capsule Take 1 g by mouth daily.   Marland Kitchen levothyroxine (SYNTHROID, LEVOTHROID) 75 MCG tablet TAKE 1 TABLET (75 MCG TOTAL) BY MOUTH DAILY BEFORE BREAKFAST.  Marland Kitchen nicotine polacrilex (NICORETTE) 4 MG gum Take 4 mg by mouth as needed for smoking cessation.   . psyllium (REGULOID) 0.52 g capsule Take 0.52 g by mouth daily.  . [DISCONTINUED] celecoxib (CELEBREX) 200 MG capsule Take 1 capsule (200 mg total) by mouth 2 (two) times daily. (Patient taking  differently: Take 200 mg by mouth daily. )  . [DISCONTINUED] oxyCODONE (ROXICODONE) 5 MG immediate release tablet Take 1-2 tablets (5-10 mg total) by mouth every 4 (four) hours as needed for moderate pain or severe pain.   No facility-administered encounter medications on file as of 02/04/2019.     Activities of Daily Living In your present state of health, do you have any difficulty performing the following activities: 02/04/2019 11/24/2018  Hearing? N -  Vision? N -  Difficulty concentrating or making  decisions? N -  Walking or climbing stairs? N N  Dressing or bathing? N -  Doing errands, shopping? N N  Preparing Food and eating ? N -  Using the Toilet? N -  In the past six months, have you accidently leaked urine? N -  Do you have problems with loss of bowel control? N -  Managing your Medications? N -  Managing your Finances? N -  Housekeeping or managing your Housekeeping? N -  Some recent data might be hidden    Patient Care Team: [name], MD as PCP - General (Internal Medicine) Christene Lye, MD (General Surgery)    Assessment:   This is a routine wellness examination for Alexis Mcdonald.  Currently in physical therapy; shoulder surgery rehab.   Health Screenings  Mammogram -09/23/18 Colonoscopy -10/21/18 Bone Density 07/10/14 Glaucoma -none Hearing -demonstrates normal hearing during conversation Glucose-11/03/18 (94) Cholesterol -05/26/18 (203)  Social  Alcohol intake -yes, 3 drinks per week Smoking history- former Tobacco use? Yes, nicorettes Illicit drug use? none Exercise -cardio/core water fitness 7 days, 60 minutes Diet -primarily vegetarian; chicken at minimal Sexually Active -not currently  Safety  Patient feels safe at home.  Patient does have smoke detectors at home  Patient does wear sunscreen or protective clothing when in direct sunlight  Patient does wear seat belt when driving or riding with others.   Activities of Daily  Living Patient can do their own household chores. Denies needing assistance with: driving, feeding themselves, getting from bed to chair, getting to the toilet, bathing/showering, dressing, managing money, climbing flight of stairs, or preparing meals.   Depression Screen Patient denies losing interest in daily life, feeling hopeless, or crying easily over simple problems.   Fall Screen Patient denies being afraid of falling or falling in the last year.   Memory Screen Patient denies problems with memory, misplacing items, and is able to balance checkbook/bank accounts.  Patient is alert, normal appearance, oriented to person/place/and time. Correctly identified the president of the [place], recall of 3/3 objects, and performing simple calculations.  Patient displays appropriate judgement and can read correct time from watch face.   Immunizations The following Immunizations are up to date: Influenza, pneumonia, and tetanus.   Pneumovax 23 administered R deltoid, tolerated well.  Educational material provided.   Shingles discussed.   Other Providers Patient Care Team: [name], MD as PCP - General (Internal Medicine) Christene Lye, MD (General Surgery)  Exercise Activities and Dietary recommendations Current Exercise Habits: Structured exercise class, Type of exercise: walking(swimming, pilates, physical therapy, cardio core), Intensity: Mild  Goals    . Maintain Healthy Lifestyle       Fall Risk Fall Risk  02/04/2019 06/24/2018  Falls in the past year? 0 Yes  Number falls in past yr: - 1  Injury with Fall? - No   Depression Screen PHQ 2/9 Scores 02/04/2019 06/24/2018  PHQ - 2 Score 0 2  PHQ- 9 Score - 7     Cognitive Function     6CIT Screen 02/04/2019  What Year? 0 points  What month? 0 points  What time? 0 points  Count back from 20 0 points  Months in reverse 0 points  Repeat phrase 0 points  Total Score 0    Immunization History  Administered  Date(s) Administered  . Influenza,inj,Quad PF,6+ Mos 09/24/2014, 10/10/2015, 08/20/2016  . Influenza-Unspecified 09/29/2012, 09/29/2017, 09/08/2018  . Pneumococcal Conjugate-13 09/29/2017  . Pneumococcal Polysaccharide-23 02/04/2019  . Tdap 05/07/2013  .  Zoster 06/10/2013   Screening Tests Health Maintenance  Topic Date Due  . Hepatitis C Screening  10-05-1951  . MAMMOGRAM  09/24/2019  . TETANUS/TDAP  05/08/2023  . COLONOSCOPY  10/21/2028  . INFLUENZA VACCINE  Completed  . DEXA SCAN  Completed  . PNA vac Low Risk Adult  Completed   Additional Screenings: Hepatitis C Screening: Consent given.  Future lab placed.      Plan:    End of life planning; Advance aging; Advanced directives discussed. Copy of current HCPOA/Living Will requested.    I have personally reviewed and noted the following in the patient's chart:   . Medical and social history . Use of alcohol, tobacco or illicit drugs  . Current medications and supplements . Functional ability and status . Nutritional status . Physical activity . Advanced directives . List of other physicians . Hospitalizations, surgeries, and ER visits in previous 12 months . Vitals . Screenings to include cognitive, depression, and falls . Referrals and appointments  In addition, I have reviewed and discussed with patient certain preventive protocols, quality metrics, and best practice recommendations. A written personalized care plan for preventive services as well as general preventive health recommendations were provided to patient.     OBrien-Blaney, Berry Godsey L, LPN  0/0/9233   I have reviewed the above information and agree with above.   Deborra Medina, MD

## 2019-02-05 ENCOUNTER — Ambulatory Visit: Payer: Medicare Other

## 2019-02-09 DIAGNOSIS — M25512 Pain in left shoulder: Secondary | ICD-10-CM | POA: Diagnosis not present

## 2019-02-11 DIAGNOSIS — M25512 Pain in left shoulder: Secondary | ICD-10-CM | POA: Diagnosis not present

## 2019-02-16 DIAGNOSIS — M25512 Pain in left shoulder: Secondary | ICD-10-CM | POA: Diagnosis not present

## 2019-02-23 DIAGNOSIS — M25512 Pain in left shoulder: Secondary | ICD-10-CM | POA: Diagnosis not present

## 2019-02-26 DIAGNOSIS — M25512 Pain in left shoulder: Secondary | ICD-10-CM | POA: Diagnosis not present

## 2019-03-01 DIAGNOSIS — J42 Unspecified chronic bronchitis: Secondary | ICD-10-CM | POA: Diagnosis not present

## 2019-03-01 DIAGNOSIS — G40909 Epilepsy, unspecified, not intractable, without status epilepticus: Secondary | ICD-10-CM | POA: Diagnosis not present

## 2019-03-01 DIAGNOSIS — R140 Abdominal distension (gaseous): Secondary | ICD-10-CM | POA: Diagnosis not present

## 2019-03-01 DIAGNOSIS — N200 Calculus of kidney: Secondary | ICD-10-CM | POA: Diagnosis not present

## 2019-03-01 DIAGNOSIS — K635 Polyp of colon: Secondary | ICD-10-CM | POA: Diagnosis not present

## 2019-03-01 DIAGNOSIS — D709 Neutropenia, unspecified: Secondary | ICD-10-CM | POA: Diagnosis not present

## 2019-03-01 DIAGNOSIS — Z87891 Personal history of nicotine dependence: Secondary | ICD-10-CM | POA: Diagnosis not present

## 2019-03-01 DIAGNOSIS — R195 Other fecal abnormalities: Secondary | ICD-10-CM | POA: Diagnosis not present

## 2019-03-01 DIAGNOSIS — Z87442 Personal history of urinary calculi: Secondary | ICD-10-CM | POA: Diagnosis not present

## 2019-03-01 DIAGNOSIS — F329 Major depressive disorder, single episode, unspecified: Secondary | ICD-10-CM | POA: Diagnosis not present

## 2019-03-04 NOTE — Telephone Encounter (Signed)
Sent to PCP to advise 

## 2019-03-09 ENCOUNTER — Encounter: Payer: Self-pay | Admitting: Internal Medicine

## 2019-03-09 ENCOUNTER — Ambulatory Visit (INDEPENDENT_AMBULATORY_CARE_PROVIDER_SITE_OTHER): Payer: Medicare Other

## 2019-03-09 ENCOUNTER — Ambulatory Visit (INDEPENDENT_AMBULATORY_CARE_PROVIDER_SITE_OTHER): Payer: Medicare Other | Admitting: Internal Medicine

## 2019-03-09 VITALS — BP 112/70 | HR 56 | Temp 98.0°F | Resp 16 | Ht 63.5 in | Wt 117.8 lb

## 2019-03-09 DIAGNOSIS — R05 Cough: Secondary | ICD-10-CM

## 2019-03-09 DIAGNOSIS — B9789 Other viral agents as the cause of diseases classified elsewhere: Secondary | ICD-10-CM | POA: Diagnosis not present

## 2019-03-09 DIAGNOSIS — N952 Postmenopausal atrophic vaginitis: Secondary | ICD-10-CM | POA: Diagnosis not present

## 2019-03-09 DIAGNOSIS — J069 Acute upper respiratory infection, unspecified: Secondary | ICD-10-CM | POA: Diagnosis not present

## 2019-03-09 DIAGNOSIS — R058 Other specified cough: Secondary | ICD-10-CM

## 2019-03-09 DIAGNOSIS — R0989 Other specified symptoms and signs involving the circulatory and respiratory systems: Secondary | ICD-10-CM | POA: Diagnosis not present

## 2019-03-09 MED ORDER — LEVOFLOXACIN 500 MG PO TABS: 500.0000 mg | ORAL_TABLET | Freq: Every day | ORAL | 0 refills | Status: DC

## 2019-03-09 MED ORDER — PREDNISONE 10 MG PO TABS: ORAL_TABLET | ORAL | 0 refills | Status: DC

## 2019-03-09 MED ORDER — ESTROGENS, CONJUGATED 0.625 MG/GM VA CREA: TOPICAL_CREAM | VAGINAL | 12 refills | Status: DC

## 2019-03-09 MED ORDER — GUAIFENESIN-CODEINE 100-10 MG/5ML PO SYRP: 5.0000 mL | ORAL_SOLUTION | Freq: Three times a day (TID) | ORAL | 0 refills | Status: DC | PRN

## 2019-03-09 NOTE — Progress Notes (Signed)
Subjective:  Patient ID: Alexis Mcdonald, female    DOB: 06/04/1951  Age: 68 y.o. MRN: [id number]  CC: The primary encounter diagnosis was Cough productive of clear sputum. Diagnoses of Post-menopausal atrophic vaginitis and Viral URI with cough were also pertinent to this visit.  HPI [name] [facility] presents for evaluation and management of vaginal dryness and cough  Cough has been present for 2 weeks, after coming in contact with a flu .  Fever body aches have resolved.  But her  Maxillary sinsues are still  tender and head/ear feel "plugged up" and the feeling of pressure increases when she bends over .  Has a persistent non productive cough,  Feels things moving around in her chest.  denies pleurisy and wheezing,  Just feels Very fatigued.   2) vaginal dryness:  Interfering with intercourse.  No previous trial of vaginal estrogen .  Worried about side effecxts of system estrogen.   3) shoulder surgery went well.  Still in PT>  llowed to lifer  Lbs only,  No swimming yet   Outpatient Medications Prior to Visit  Medication Sig Dispense Refill  . ALPRAZolam (XANAX) 0.25 MG tablet Take 1 tablet (0.25 mg total) by mouth at bedtime as needed for sleep. (Patient taking differently: Take 0.125 mg by mouth at bedtime as needed for sleep. ) 30 tablet 5  . Cholecalciferol (VITAMIN D) 2000 units CAPS Take 2,000 Units by mouth daily.     . fexofenadine-pseudoephedrine (ALLEGRA-D) 60-120 MG 12 hr tablet Take 1 tablet by mouth daily as needed (allergies).    . fish oil-omega-3 fatty acids 1000 MG capsule Take 1 g by mouth daily.     Marland Kitchen levothyroxine (SYNTHROID, LEVOTHROID) 75 MCG tablet TAKE 1 TABLET (75 MCG TOTAL) BY MOUTH DAILY BEFORE BREAKFAST. 90 tablet 1  . nicotine polacrilex (NICORETTE) 4 MG gum Take 4 mg by mouth as needed for smoking cessation.     . psyllium (REGULOID) 0.52 g capsule Take 0.52 g by mouth daily.     No facility-administered medications prior to visit.      Review of Systems;  Patient denies headache, fevers, malaise, unintentional weight loss, skin rash, eye pain, sinus congestion and sinus pain, sore throat, dysphagia,  hemoptysis , cough, dyspnea, wheezing, chest pain, palpitations, orthopnea, edema, abdominal pain, nausea, melena, diarrhea, constipation, flank pain, dysuria, hematuria, urinary  Frequency, nocturia, numbness, tingling, seizures,  Focal weakness, Loss of consciousness,  Tremor, insomnia, depression, anxiety, and suicidal ideation.      Objective:  BP 112/70 (BP Location: Left Arm, Patient Position: Sitting, Cuff Size: Normal)   Pulse (!) 56   Temp 98 F (36.7 C) (Oral)   Resp 16   Ht 5' 3.5" (1.613 m)   Wt 117 lb 12.8 oz (53.4 kg)   SpO2 98%   BMI 20.54 kg/m   BP Readings from Last 3 Encounters:  03/09/19 112/70  02/04/19 112/62  12/01/18 (!) 142/97    Wt Readings from Last 3 Encounters:  03/09/19 117 lb 12.8 oz (53.4 kg)  02/04/19 118 lb (53.5 kg)  12/01/18 120 lb (54.4 kg)    General appearance: alert, cooperative and appears stated age Ears: normal TM's and external ear canals both ears Throat: lips, mucosa, and tongue normal; teeth and gums normal Neck: no adenopathy, no carotid bruit, supple, symmetrical, trachea midline and thyroid not enlarged, symmetric, no tenderness/mass/nodules Back: symmetric, no curvature. ROM normal. No CVA tenderness. Lungs: clear to auscultation bilaterally Heart: regular rate and rhythm,  S1, S2 normal, no murmur, click, rub or gallop Abdomen: soft, non-tender; bowel sounds normal; no masses,  no organomegaly Pulses: 2+ and symmetric Gyn:  ATROPHIC vaginal walls.  Skin: Skin color, texture, turgor normal. No rashes or lesions Lymph nodes: Cervical, supraclavicular, and axillary nodes normal.  No results found for: HGBA1C  Lab Results  Component Value Date   CREATININE 0.71 11/03/2018   CREATININE 0.62 05/26/2018   CREATININE 0.70 08/20/2016    Lab Results    Component Value Date   WBC 2.9 (L) 11/24/2018   HGB 12.5 11/24/2018   HCT 38.9 11/24/2018   PLT 149 (L) 11/24/2018   GLUCOSE 94 11/03/2018   CHOL 203 (H) 05/26/2018   TRIG 45.0 05/26/2018   HDL 95.10 05/26/2018   LDLCALC 99 05/26/2018   ALT 13 11/03/2018   AST 18 11/03/2018   NA 137 11/03/2018   K 3.6 11/03/2018   CL 103 11/03/2018   CREATININE 0.71 11/03/2018   BUN 26 (H) 11/03/2018   CO2 28 11/03/2018   TSH 3.27 06/24/2018    No results found.  Assessment & Plan:   Problem List Items Addressed This Visit    Viral URI with cough    Her symptoms do not suggest that she has a bacterial infection and therefore does not need to take an antibiotic like clindamycin or augmentin, because they will not help viral infections . prednisone taper and a cough suppressant  To [facility] to use for the cough      Post-menopausal atrophic vaginitis    Vaginal estrogen discussed and prescribed       Cough productive of clear sputum - Primary   Relevant Orders   DG Chest 2 View (Completed)      I am having [name] "Patty" start on predniSONE, guaiFENesin-codeine, levofloxacin, and conjugated estrogens. I am also having her maintain her nicotine polacrilex, fish oil-omega-3 fatty acids, ALPRAZolam, Vitamin D, levothyroxine, psyllium, and fexofenadine-pseudoephedrine.  Meds ordered this encounter  Medications  . predniSONE (DELTASONE) 10 MG tablet    Sig: 6 tablets on Day 1 , then reduce by 1 tablet daily until gone    Dispense:  21 tablet    Refill:  0  . guaiFENesin-codeine (CHERATUSSIN AC) 100-10 MG/5ML syrup    Sig: Take 5 mLs by mouth 3 (three) times daily as needed for cough.    Dispense:  120 mL    Refill:  0  . levofloxacin (LEVAQUIN) 500 MG tablet    Sig: Take 1 tablet (500 mg total) by mouth daily.    Dispense:  7 tablet    Refill:  0  . conjugated estrogens (PREMARIN) vaginal cream    Sig: Insert one applicator into vagina nightly for 2 weeks,   Then reduce use to 2 times weekly thereafter    Dispense:  42.5 g    Refill:  12  A total of 25 minutes of face to face time was spent with patient more than half of which was spent in counselling about the above mentioned conditions  and coordination of care   There are no discontinued medications.  Follow-up: No follow-ups on file.   [name], MD

## 2019-03-09 NOTE — Patient Instructions (Signed)
Prednisone taper and sudafed PE for the sinus congestion  cheratussin for cough   You should flush your sinuses with  [employer]'s Sinus rinse ;  It is a strong sinus "flush" using water and medicated salts.  Do it over the sink because it can be a bit messy  If your chest x ray suggests pneumonia,  Or if your sinuses become more tender once you start the prednisone,  You can start the antibiotic  Levofloxacin   Daily use of a probiotic advised for 3 weeks. (yogurt will suffice if ou eat it daily )    Vaginal estrogen will only treat atrophic vaginitis.  It is not absorbed systemically because the amount is so much less   Use it nightly for 2 weeks,  Then reduce use to 2 times weekly thereafter

## 2019-03-10 DIAGNOSIS — N952 Postmenopausal atrophic vaginitis: Secondary | ICD-10-CM | POA: Insufficient documentation

## 2019-03-10 DIAGNOSIS — R05 Cough: Secondary | ICD-10-CM | POA: Insufficient documentation

## 2019-03-10 DIAGNOSIS — R058 Other specified cough: Secondary | ICD-10-CM | POA: Insufficient documentation

## 2019-03-10 NOTE — Assessment & Plan Note (Signed)
Vaginal estrogen discussed and prescribed

## 2019-03-10 NOTE — Assessment & Plan Note (Signed)
Her symptoms do not suggest that she has a bacterial infection and therefore does not need to take an antibiotic like clindamycin or augmentin, because they will not help viral infections . prednisone taper and a cough suppressant  To [facility] to use for the cough

## 2019-04-13 DIAGNOSIS — M24112 Other articular cartilage disorders, left shoulder: Secondary | ICD-10-CM | POA: Diagnosis not present

## 2019-04-13 DIAGNOSIS — S46012D Strain of muscle(s) and tendon(s) of the rotator cuff of left shoulder, subsequent encounter: Secondary | ICD-10-CM | POA: Diagnosis not present

## 2019-04-13 DIAGNOSIS — M7582 Other shoulder lesions, left shoulder: Secondary | ICD-10-CM | POA: Diagnosis not present

## 2019-05-10 ENCOUNTER — Other Ambulatory Visit: Payer: Self-pay | Admitting: Internal Medicine

## 2019-06-03 DIAGNOSIS — F418 Other specified anxiety disorders: Secondary | ICD-10-CM | POA: Diagnosis not present

## 2019-07-06 ENCOUNTER — Telehealth: Payer: Self-pay

## 2019-07-06 NOTE — Telephone Encounter (Signed)
LMTCB to try to get patient scheduled for f/u to cover her 6 patient gaps with [facility].

## 2019-07-09 ENCOUNTER — Other Ambulatory Visit: Payer: Self-pay

## 2019-07-09 ENCOUNTER — Ambulatory Visit (INDEPENDENT_AMBULATORY_CARE_PROVIDER_SITE_OTHER): Payer: Medicare Other | Admitting: Internal Medicine

## 2019-07-09 ENCOUNTER — Encounter: Payer: Self-pay | Admitting: Internal Medicine

## 2019-07-09 DIAGNOSIS — Z9889 Other specified postprocedural states: Secondary | ICD-10-CM | POA: Diagnosis not present

## 2019-07-09 DIAGNOSIS — F5102 Adjustment insomnia: Secondary | ICD-10-CM | POA: Diagnosis not present

## 2019-07-09 DIAGNOSIS — E890 Postprocedural hypothyroidism: Secondary | ICD-10-CM

## 2019-07-09 MED ORDER — ALPRAZOLAM 0.25 MG PO TABS: 0.1250 mg | ORAL_TABLET | Freq: Every evening | ORAL | 5 refills | Status: DC | PRN

## 2019-07-09 NOTE — Assessment & Plan Note (Signed)
Doing well post operative with restoration of full ROM and strength.

## 2019-07-09 NOTE — Assessment & Plan Note (Signed)
Discussed natural remedies for insomnia including herbal tea and melatonin.  Reviewd principles of good sleep hygiene. The risks and benefits of benzodiazepine use were discussed with patient today including excessive sedation leading to respiratory depression,  impaired thinking/driving, and addiction.  Patient was advised to avoid concurrent use with alcohol, to use medication only as needed and not to share with others  .

## 2019-07-09 NOTE — Assessment & Plan Note (Signed)
Thyroid function has been stable and she has no signs or symptoms of under or active thyroid  .  No current changes needed.

## 2019-07-09 NOTE — Progress Notes (Signed)
Virtual Visit converted to Telephone Note  This visit type was conducted due to national recommendations for restrictions regarding the COVID-19 pandemic (e.g. social distancing).  This format is felt to be most appropriate for this patient at this time.  All issues noted in this document were discussed and addressed.  No physical exam was performed (except for noted visual exam findings with Video Visits).   I connected with@ on 07/09/19 at  1:30 PM EDT by  telephone and verified that I am speaking with the correct person using two identifiers. Location patient: home Location provider: work or home office Persons participating in the virtual visit: patient, provider  I discussed the limitations, risks, security and privacy concerns of performing an evaluation and management service by telephone and the availability of in person appointments. I also discussed with the patient that there may be a patient responsible charge related to this service. The patient expressed understanding and agreed to proceed.   Reason for visit: medication refill   HPI:  The patient has no signs or symptoms of COVID 19 infection (fever, cough, sore throat  or shortness of breath beyond what is typical for patient).  Patient denies contact with other persons with the above mentioned symptoms or with anyone confirmed to have COVID 19 .  She notes improved sleep,  Rare use of alprazolam and benadryl becuaes she is aware of the increased risk of dementia with regular use of either.  Less anxiety in spite of  loss of income from not working at [employer]  URI with cough treated in March totally resolved.  S/p left shoulder surgery in April by Poggi.  Using a personal trainer for the last 2 weeks and is now swimming [email or web address]  History of retained renal calculi: No recent stone action. Supplementing diet daily with citric acid.    Hypothyroid:  Taking meds,  No symptoms of under or overactive  thyriod  ROS: See pertinent positives and negatives per HPI.  Past Medical History:  Diagnosis Date  . Borderline osteopenia    prior DEXA at [facility]   . Cancer ([place])    Melanoma removed from right hip  . Colon polyp 2009   next one due 2012  . 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 History of kidney stones   . Hypothyroidism   . Kidney stones   . Neutropenia    no infections,  bone marrow biopsy by [name] next week  . renal calculi    s/p lithotripsy, [place]  . Thyroid disease    hypothyroidism    Past Surgical History:  Procedure Laterality Date  . COLONOSCOPY  03/23/2010   Diverticulosis  . COLONOSCOPY W/ BIOPSIES  03/14/2006   Tubular adenoma of the rectum x2, upper lesion with focal high-grade dysplasia.  Polyps 9 mm in size.  Marland Kitchen COLONOSCOPY WITH PROPOFOL N/A 10/21/2018   Procedure: COLONOSCOPY WITH PROPOFOL;  Surgeon: Robert Bellow, MD;  Location: [facility] ENDOSCOPY;  Service: Endoscopy;  Laterality: N/A;  . DILATION AND CURETTAGE OF UTERUS    . JOINT REPLACEMENT  2005   done in [place]  . kidney stones removal    . MELANOMA EXCISION  06/29/2016  . right hip replacement    . SHOULDER ARTHROSCOPY WITH OPEN ROTATOR CUFF REPAIR Left 12/01/2018   Procedure: SHOULDER ARTHROSCOPY WITH OPEN ROTATOR CUFF REPAIR;  Surgeon: Corky Mull, MD;  Location: [facility] ORS;  Service: Orthopedics;  Laterality:  Left;    Family History  Problem Relation Age of Onset  . Diabetes Mother   . Stroke Mother   . Hyperlipidemia Mother   . Hypertension Mother   . Alcohol abuse Father   . Diabetes Brother   . Alcohol abuse Brother   . Hypertension Brother   . Alcohol abuse Brother   . Heart disease Maternal Uncle   . Breast cancer Neg Hx     SOCIAL HX:  reports that she quit smoking about 18 years ago. Her smoking use included cigarettes. She has never used smokeless tobacco. She reports current alcohol use. She reports that she does not use  drugs.   Current Outpatient Medications:  .  ALPRAZolam (XANAX) 0.25 MG tablet, Take 0.5 tablets (0.125 mg total) by mouth at bedtime as needed for sleep., Disp: 30 tablet, Rfl: 5 .  Cholecalciferol (VITAMIN D) 2000 units CAPS, Take 2,000 Units by mouth daily. , Disp: , Rfl:  .  EUTHYROX 75 MCG tablet, TAKE 1 TABLET BY MOUTH ONCE DAILY BEFORE BREAKFAST, Disp: 90 tablet, Rfl: 0 .  fexofenadine-pseudoephedrine (ALLEGRA-D) 60-120 MG 12 hr tablet, Take 1 tablet by mouth daily as needed (allergies)., Disp: , Rfl:  .  fish oil-omega-3 fatty acids 1000 MG capsule, Take 1 g by mouth daily. , Disp: , Rfl:  .  nicotine polacrilex (NICORETTE) 4 MG gum, Take 4 mg by mouth as needed for smoking cessation. , Disp: , Rfl:  .  psyllium (REGULOID) 0.52 g capsule, Take 0.52 g by mouth daily., Disp: , Rfl:   EXAM:   General impression: alert, cooperative and articulate.  No signs of being in distress  Lungs: speech is fluent sentence length suggests that patient is not short of breath and not punctuated by cough, sneezing or sniffing. Marland Kitchen   Psych: affect normal.  speech is articulate and non pressured .  Denies suicidal thoughts   ASSESSMENT AND PLAN:  S/P shoulder surgery Doing well post operative with restoration of full ROM and strength.   Hypothyroidism Thyroid function has been stable and she has no signs or symptoms of under or active thyroid  .  No current changes needed.   Insomnia due to stress Discussed natural remedies for insomnia including herbal tea and melatonin.  Reviewd principles of good sleep hygiene. The risks and benefits of benzodiazepine use were discussed with patient today including excessive sedation leading to respiratory depression,  impaired thinking/driving, and addiction.  Patient was advised to avoid concurrent use with alcohol, to use medication only as needed and not to share with others  .     I discussed the assessment and treatment plan with the patient. The patient was  provided an opportunity to ask questions and all were answered. The patient agreed with the plan and demonstrated an understanding of the instructions.   The patient was advised to call back or seek an in-person evaluation if the symptoms worsen or if the condition fails to improve as anticipated.  I provided 25 minutes of non-face-to-face time during this encounter.   [name], MD

## 2019-08-11 ENCOUNTER — Other Ambulatory Visit: Payer: Self-pay | Admitting: Internal Medicine

## 2019-08-11 DIAGNOSIS — Z1231 Encounter for screening mammogram for malignant neoplasm of breast: Secondary | ICD-10-CM

## 2019-08-20 DIAGNOSIS — H2513 Age-related nuclear cataract, bilateral: Secondary | ICD-10-CM | POA: Diagnosis not present

## 2019-08-21 ENCOUNTER — Other Ambulatory Visit: Payer: Self-pay | Admitting: Internal Medicine

## 2019-08-27 DIAGNOSIS — M25521 Pain in right elbow: Secondary | ICD-10-CM | POA: Diagnosis not present

## 2019-08-27 DIAGNOSIS — M25532 Pain in left wrist: Secondary | ICD-10-CM | POA: Diagnosis not present

## 2019-08-27 DIAGNOSIS — M7701 Medial epicondylitis, right elbow: Secondary | ICD-10-CM | POA: Diagnosis not present

## 2019-08-27 DIAGNOSIS — M659 Synovitis and tenosynovitis, unspecified: Secondary | ICD-10-CM | POA: Diagnosis not present

## 2019-08-30 DIAGNOSIS — D2262 Melanocytic nevi of left upper limb, including shoulder: Secondary | ICD-10-CM | POA: Diagnosis not present

## 2019-08-30 DIAGNOSIS — Z85820 Personal history of malignant melanoma of skin: Secondary | ICD-10-CM | POA: Diagnosis not present

## 2019-08-30 DIAGNOSIS — L820 Inflamed seborrheic keratosis: Secondary | ICD-10-CM | POA: Diagnosis not present

## 2019-08-30 DIAGNOSIS — L570 Actinic keratosis: Secondary | ICD-10-CM | POA: Diagnosis not present

## 2019-08-30 DIAGNOSIS — L821 Other seborrheic keratosis: Secondary | ICD-10-CM | POA: Diagnosis not present

## 2019-08-30 DIAGNOSIS — L814 Other melanin hyperpigmentation: Secondary | ICD-10-CM | POA: Diagnosis not present

## 2019-08-30 DIAGNOSIS — X32XXXA Exposure to sunlight, initial encounter: Secondary | ICD-10-CM | POA: Diagnosis not present

## 2019-08-30 DIAGNOSIS — D485 Neoplasm of uncertain behavior of skin: Secondary | ICD-10-CM | POA: Diagnosis not present

## 2019-08-30 DIAGNOSIS — L538 Other specified erythematous conditions: Secondary | ICD-10-CM | POA: Diagnosis not present

## 2019-08-30 DIAGNOSIS — D2272 Melanocytic nevi of left lower limb, including hip: Secondary | ICD-10-CM | POA: Diagnosis not present

## 2019-08-30 DIAGNOSIS — Z85828 Personal history of other malignant neoplasm of skin: Secondary | ICD-10-CM | POA: Diagnosis not present

## 2019-09-03 ENCOUNTER — Telehealth: Payer: Self-pay | Admitting: Internal Medicine

## 2019-09-03 NOTE — Telephone Encounter (Signed)
Patient is coming in on 09/15/19 for flu shot. Requesting shingles injection as well. Please advise [phone]

## 2019-09-03 NOTE — Telephone Encounter (Signed)
Patient can not get both done at the same time

## 2019-09-14 NOTE — Telephone Encounter (Signed)
Pt aware cannot get both at same time.  I also advised pt to check with her insurance to see if even covered in office since she is medicare

## 2019-09-15 ENCOUNTER — Ambulatory Visit: Payer: Medicare Other

## 2019-09-28 ENCOUNTER — Ambulatory Visit (INDEPENDENT_AMBULATORY_CARE_PROVIDER_SITE_OTHER): Payer: Medicare Other | Admitting: Family Medicine

## 2019-09-28 ENCOUNTER — Encounter: Payer: Self-pay | Admitting: Family Medicine

## 2019-09-28 ENCOUNTER — Ambulatory Visit: Payer: Medicare Other

## 2019-09-28 ENCOUNTER — Other Ambulatory Visit: Payer: Self-pay

## 2019-09-28 VITALS — Ht 65.0 in | Wt 115.0 lb

## 2019-09-28 DIAGNOSIS — R0981 Nasal congestion: Secondary | ICD-10-CM

## 2019-09-28 DIAGNOSIS — R509 Fever, unspecified: Secondary | ICD-10-CM | POA: Diagnosis not present

## 2019-09-28 DIAGNOSIS — Z20822 Contact with and (suspected) exposure to covid-19: Secondary | ICD-10-CM

## 2019-09-28 NOTE — Progress Notes (Signed)
Patient ID: Alexis Mcdonald, female   DOB: Dec 24, 1951, 68 y.o.   MRN: [id number]    Virtual Visit via video Note  This visit type was conducted due to national recommendations for restrictions regarding the COVID-19 pandemic (e.g. social distancing).  This format is felt to be most appropriate for this patient at this time.  All issues noted in this document were discussed and addressed.  No physical exam was performed (except for noted visual exam findings with Video Visits).   I connected with Alexis Mcdonald today at  1:40 PM EDT by a video enabled telemedicine application or telephone and verified that I am speaking with the correct person using two identifiers. Location patient: home Location provider: work or home office Persons participating in the virtual visit: patient, provider  I discussed the limitations, risks, security and privacy concerns of performing an evaluation and management service by video and the availability of in person appointments. I also discussed with the patient that there may be a patient responsible charge related to this service. The patient expressed understanding and agreed to proceed.    HPI:  Patient and I connected via video due to complaint of some fever and chills, nasal congestion feeling rundown and some soreness in throat off and on for past 5 or 6 days.  Patient did get flu shot a little over a week ago, wondering if this is a minor reaction from that or possibly COVID-19.  Patient states she would not usually be overly concerned, but due to the pandemic she was wondering if she should be tested.  As far she knows she has not been exposed to anyone who is under investigation for or who has been confirmed positive COVID-19.  Denies shortness of breath or wheezing.  Denies drooling or difficulty swallowing food.  Denies vomiting or diarrhea.  Denies body aches.   ROS: See pertinent positives and negatives per HPI.  Past Medical  History:  Diagnosis Date  . Borderline osteopenia    prior DEXA at [facility]   . Cancer ([place])    Melanoma removed from right hip  . Colon polyp 2009   next one due 2012  . 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 History of kidney stones   . Hypothyroidism   . Kidney stones   . Neutropenia    no infections,  bone marrow biopsy by [name] next week  . renal calculi    s/p lithotripsy, [place]  . Thyroid disease    hypothyroidism    Past Surgical History:  Procedure Laterality Date  . COLONOSCOPY  03/23/2010   Diverticulosis  . COLONOSCOPY W/ BIOPSIES  03/14/2006   Tubular adenoma of the rectum x2, upper lesion with focal high-grade dysplasia.  Polyps 9 mm in size.  Marland Kitchen COLONOSCOPY WITH PROPOFOL N/A 10/21/2018   Procedure: COLONOSCOPY WITH PROPOFOL;  Surgeon: Robert Bellow, MD;  Location: [facility] ENDOSCOPY;  Service: Endoscopy;  Laterality: N/A;  . DILATION AND CURETTAGE OF UTERUS    . JOINT REPLACEMENT  2005   done in [place]  . kidney stones removal    . MELANOMA EXCISION  06/29/2016  . right hip replacement    . SHOULDER ARTHROSCOPY WITH OPEN ROTATOR CUFF REPAIR Left 12/01/2018   Procedure: SHOULDER ARTHROSCOPY WITH OPEN ROTATOR CUFF REPAIR;  Surgeon: Corky Mull, MD;  Location: [facility] ORS;  Service: Orthopedics;  Laterality: Left;    Family History  Problem Relation Age of Onset  .  Diabetes Mother   . Stroke Mother   . Hyperlipidemia Mother   . Hypertension Mother   . Alcohol abuse Father   . Diabetes Brother   . Alcohol abuse Brother   . Hypertension Brother   . Alcohol abuse Brother   . Heart disease Maternal Uncle   . Breast cancer Neg Hx     Current Outpatient Medications:  .  ALPRAZolam (XANAX) 0.25 MG tablet, Take 0.5 tablets (0.125 mg total) by mouth at bedtime as needed for sleep., Disp: 30 tablet, Rfl: 5 .  celecoxib (CELEBREX) 200 MG capsule, Take by mouth., Disp: , Rfl:  .  Cholecalciferol (VITAMIN D) 2000 units  CAPS, Take 2,000 Units by mouth daily. , Disp: , Rfl:  .  EUTHYROX 75 MCG tablet, TAKE 1 TABLET BY MOUTH ONCE DAILY BEFORE BREAKFAST, Disp: 90 tablet, Rfl: 0 .  fexofenadine-pseudoephedrine (ALLEGRA-D) 60-120 MG 12 hr tablet, Take 1 tablet by mouth daily as needed (allergies)., Disp: , Rfl:  .  fish oil-omega-3 fatty acids 1000 MG capsule, Take 1 g by mouth daily. , Disp: , Rfl:  .  nicotine polacrilex (NICORETTE) 4 MG gum, Take 4 mg by mouth as needed for smoking cessation. , Disp: , Rfl:  .  psyllium (REGULOID) 0.52 g capsule, Take 0.52 g by mouth daily., Disp: , Rfl:   EXAM:   GENERAL: alert, oriented, appears well and in no acute distress  HEENT: atraumatic, conjunttiva clear, no obvious abnormalities on inspection of external nose and ears  NECK: normal movements of the head and neck  LUNGS: on inspection no signs of respiratory distress, breathing rate appears normal, no obvious gross SOB, gasping or wheezing  CV: no obvious cyanosis  MS: moves all visible extremities without noticeable abnormality  PSYCH/NEURO: pleasant and cooperative, no obvious depression or anxiety, speech and thought processing grossly intact  ASSESSMENT AND PLAN:  Discussed the following assessment and plan:  Suspected COVID-19 virus infection- advised that due to symptoms, we need to get patient set up for COVID-19 testing.  Patient advised that she can go to testing location for for drive-through testing. Patient given the address of testing site.  Patient advised that testing is taking 2 to 7 days to result, and while we are awaiting results patient must remain under self quarantine and monitor for any changing/worsening symptoms.  Advised over-the-counter medications such as Tylenol can be used to help treat pain or fevers, Robitussin can be used to help calm cough, allergy medication such as Claritin or Allegra can help reduce congestion.  Also discussed getting plenty of rest and increasing fluid  intake.  Made patient aware that test results as well as how his symptoms progress will determine when the self quarantine will be able to end.  Also advised to monitor self for any worsening symptoms, advised if severe shortness of breath develops, high fever that is not reduced with use of Tylenol, chest pain, severe vomiting or diarrhea  --patient must call on-call and or go to ER right away for evaluation. patient verbalized understanding of these instructions.    I discussed the assessment and treatment plan with the patient. The patient was provided an opportunity to ask questions and all were answered. The patient agreed with the plan and demonstrated an understanding of the instructions.   The patient was advised to call back or seek an in-person evaluation if the symptoms worsen or if the condition fails to improve as anticipated.  I provided 15 minutes of video-face-to-face time during  this encounter.   Jodelle Green, FNP

## 2019-09-29 LAB — NOVEL CORONAVIRUS, NAA: SARS-CoV-2, NAA: NOT DETECTED

## 2019-09-30 DIAGNOSIS — F418 Other specified anxiety disorders: Secondary | ICD-10-CM | POA: Diagnosis not present

## 2019-10-01 ENCOUNTER — Ambulatory Visit: Payer: Medicare Other

## 2019-10-12 DIAGNOSIS — M7701 Medial epicondylitis, right elbow: Secondary | ICD-10-CM | POA: Diagnosis not present

## 2019-10-12 DIAGNOSIS — M25521 Pain in right elbow: Secondary | ICD-10-CM | POA: Diagnosis not present

## 2019-10-19 ENCOUNTER — Other Ambulatory Visit: Payer: Self-pay

## 2019-10-19 ENCOUNTER — Ambulatory Visit: Payer: Medicare Other | Attending: Sports Medicine

## 2019-10-19 DIAGNOSIS — M7701 Medial epicondylitis, right elbow: Secondary | ICD-10-CM | POA: Diagnosis not present

## 2019-10-19 NOTE — Therapy (Signed)
[facility] [address], [place], [address] Phone: [phone]   Fax:  [phone]  Physical Therapy Evaluation  Patient Details  Name: Alexis Mcdonald MRN: [id number] Date of Birth: 11-27-1951 Referring Provider (PT): Candelaria Stagers MD   Encounter Date: 10/19/2019  PT End of Session - 10/19/19 0852    Visit Number  1    Number of Visits  13    Date for PT Re-Evaluation  11/30/19    PT Start Time  0804    PT Stop Time  0900    PT Time Calculation (min)  56 min       Past Medical History:  Diagnosis Date  . Borderline osteopenia    prior [facility]   . Cancer ([place])    Melanoma removed from right hip  . Colon polyp 2009   next one due 2012  . 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 History of kidney stones   . Hypothyroidism   . Kidney stones   . Neutropenia    no infections,  bone marrow biopsy by [name] next week  . renal calculi    s/p lithotripsy, [facility]  . Thyroid disease    hypothyroidism    Past Surgical History:  Procedure Laterality Date  . COLONOSCOPY  03/23/2010   Diverticulosis  . COLONOSCOPY W/ BIOPSIES  03/14/2006   Tubular adenoma of the rectum x2, upper lesion with focal high-grade dysplasia.  Polyps 9 mm in size.  Marland Kitchen COLONOSCOPY WITH PROPOFOL N/A 10/21/2018   Procedure: COLONOSCOPY WITH PROPOFOL;  Surgeon: Robert Bellow, MD;  Location: [facility] ENDOSCOPY;  Service: Endoscopy;  Laterality: N/A;  . DILATION AND CURETTAGE OF UTERUS    . JOINT REPLACEMENT  2005   done in [place]  . kidney stones removal    . MELANOMA EXCISION  06/29/2016  . right hip replacement    . SHOULDER ARTHROSCOPY WITH OPEN ROTATOR CUFF REPAIR Left 12/01/2018   Procedure: SHOULDER ARTHROSCOPY WITH OPEN ROTATOR CUFF REPAIR;  Surgeon: Corky Mull, MD;  Location: [facility] ORS;  Service: Orthopedics;  Laterality: Left;    There were no vitals filed for this  visit.   Subjective Assessment - 10/19/19 0808    Subjective  Patient is a pleasant 68 yo female R hand dominant presenting with R elbow pain primarily in the medial epicondyle. Referral from [place], DO. Patient reports insidious onset while doing exercises with personal trainer to rehab shoulder s/p rotator cuff repair with tenodesis including pull ups; the shoulder rehab is going well. She has since "backed off" and the pain has improved; however pain is provoked with ADLs including driving, opening jar. Patient has attempted treatment with ice, Celebrex without result; considering PRP injections. 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 Active adult with interest in yoga, pilates, hiking, baking. Patient also notes pain in L wrist with weight-bearing.    Pertinent History  L rotator cuff repair with tenodesis 12/19    Limitations  Lifting;House hold activities    How long can you sit comfortably?  unlimited    How long can you stand comfortably?  unlimited    How long can you walk comfortably?  unlimited    Patient Stated Goals  Return to prior activities    Currently in Pain?  Yes    Pain Score  4     Pain Location  Elbow    Pain Orientation  Right    Pain Descriptors / Indicators  Aching    Pain Type  Chronic pain    Pain Onset  More than a month ago    Pain Frequency  Intermittent    Aggravating Factors   pronation, planks, pushup    Effect of Pain on Daily Activities  Reduced participation in weightlifting activities    Multiple Pain Sites  Yes    Pain Score  6    Pain Location  Wrist    Pain Orientation  Left    Pain Descriptors / Indicators  Sharp    Pain Type  Chronic pain    Pain Onset  More than a month ago    Pain Frequency  Intermittent    Aggravating Factors   Weight bearing    Pain Relieving Factors  Moving out of aggravating postion    Effect of Pain on Daily Activities  Affects pilates/yoga and  weightlifting participation         Observation: forward shoulders, mild Handedness: R Grip strength: R<L  Cervical ROM: grossly WNL Shoulder ROM: Mild gross limitations L abd, HBH, HBB secondary to rotator cuff repair Elbow ROM WNL except: Pronation R painful, mildly limited  Wrist: Flexion WNL Ext R 55 L 65 with bony endfeel   MMT:  Elbow:  L R Flex  4+ 4-! Ext  5 5  Wrist:  L R Pron   3+! Sup   4 Ext  5 4+ Flex  5 4-!  R Digits 1 2 3 4 5  Flex  4+! 5 5 5 5  Ext     PAM: WNL for R elbow  Tissue length: biceps WNL B  Palpation: TTP 3+ flexor mass R TTP 1+ extensor mass R Neuro: not indicated   TREATMENT  Eccentric wrist flexion/ext 2# 2 x 10 with cues for use of LLE to perform concentric portion, full excursion Supination/pronation 2# hammer grip x 10 Low rows red theraband x 10        PT Education - 10/19/19 0809    Education Details  diagnosis, prognosis, POC, HEP: eccentrics for wrist ext, flex; weighted pron/sup; shld retraction    Person(s) Educated  Patient    Methods  Explanation;Demonstration;Handout;Verbal cues    Comprehension  Verbalized understanding;Returned demonstration;Verbal cues required       PT Short Term Goals - 10/19/19 1313      PT SHORT TERM GOAL #1   Title  Patient will be complaint with HEP as adjunct to therapy to improve recovery and reduce total number of visits.    Baseline  HEP given    Time  1    Period  Weeks    Status  New    Target Date  10/26/19        PT Long Term Goals - 10/19/19 1313      PT LONG TERM GOAL #1   Title  Patient will reduce pain to 1/10 with aggravating activity    Baseline  4/10 with aggravating activity    Time  6    Period  Weeks    Status  New    Target Date  11/30/19      PT LONG TERM GOAL #2   Title  Patient will report no increased symptoms with 30 min driving with standard steering wheel grip for safety with ADLs.    Baseline  Modified grip for driving    Time  6  Period  Weeks    Status  New    Target Date  11/30/19      PT LONG TERM GOAL #3   Title  Patient will demonstrate ability to hold plank with good form for 30 sec with no increase in symptoms.    Baseline  Cannot hold plank due to pain    Time  6    Period  Weeks    Status  New    Target Date  11/30/19             Plan - 10/19/19 1235    Clinical Impression Statement  Patient presents with pain and weakness of mild severity with gripping and wrist/elbow ROM. S/S negative for neurological involvement. Medial epicondyle TTP 3+, lateral epicondyle TTP 1+ with increased tissue tension noted flexor>extensor mass. Most significant positive finding is pain with pronation. Note L wrist ulnar head posteriorly deviated with reduced extension ROM. Clinical impression: mild medial epicondylalgia consistent with tendonitis with mild lateral involvement likely secondary to overuse/deconditioning following surgery and reduced activity. Patient will benefit from skiled physical therapy to increase strength and regain function for return to PLOF.    Personal Factors and Comorbidities  Age;Comorbidity 1    Comorbidities  L rotator cuff repair with tenodesis    Examination-Participation Restrictions  Driving;Other   Exercise   Stability/Clinical Decision Making  Stable/Uncomplicated    Clinical Decision Making  Low    Rehab Potential  Excellent    PT Frequency  2x / week    PT Duration  6 weeks    PT Treatment/Interventions  Moist Heat;Ultrasound;Electrical Stimulation;Cryotherapy;Therapeutic activities;Therapeutic exercise;Patient/family education;Neuromuscular re-education;Manual techniques;Passive range of motion;Dry needling;Taping    PT Next Visit Plan  Advance MT and TE; assess functional movements    PT Home Exercise Plan  Eccentric wrist flexion/ext, resisted sup/pron, low rows    Consulted and Agree with Plan of Care  Patient       Patient will benefit from skilled therapeutic  intervention in order to improve the following deficits and impairments:  Decreased range of motion, Increased fascial restricitons, Pain, Decreased endurance, Impaired UE functional use, Hypomobility, Impaired flexibility, Decreased mobility, Decreased strength  Visit Diagnosis: Epicondylitis elbow, medial, right     Problem List Patient Active Problem List   Diagnosis Date Noted  . S/P shoulder surgery 07/09/2019  . Insomnia due to stress 07/09/2019  . Post-menopausal atrophic vaginitis 03/10/2019  . Cough productive of clear sputum 03/10/2019  . Drug-induced neutropenia ([place]) 07/08/2018  . Abnormal weight loss 07/08/2018  . Abnormal blood level of cobalt 06/27/2018  . 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 Melanoma of skin ([place]) 07/27/2015  . History of total right hip arthroplasty 02/09/2015  . Osteopenia 05/25/2014  . 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 History of nephrolithiasis 07/13/2013  . Adverse drug reaction 06/29/2013  . History of adenomatous polyp of colon 06/01/2013  . Hemochromatosis carrier   . Initial Medicare annual wellness visit 05/07/2013  . Hypothyroidism 09/25/2011  . History of idiopathic seizure 09/25/2011  . Screening for cervical cancer 09/25/2011  . Screening for breast cancer 09/25/2011  . Neutropenia ([place])     Virgia Land, SPT 10/19/2019, 4:56 PM  [facility] [address] [place], [address] Phone: [phone]   Fax:  [phone]  Name: Luanne Krzyzanowski MRN: [id number] Date of Birth: 05/07/1951

## 2019-10-19 NOTE — Addendum Note (Signed)
Addended by: Blain Pais on: 10/19/2019 04:58 PM   Modules accepted: Orders

## 2019-10-21 ENCOUNTER — Ambulatory Visit: Payer: Medicare Other

## 2019-10-21 ENCOUNTER — Other Ambulatory Visit: Payer: Self-pay

## 2019-10-21 DIAGNOSIS — M7701 Medial epicondylitis, right elbow: Secondary | ICD-10-CM | POA: Diagnosis not present

## 2019-10-21 NOTE — Therapy (Signed)
[facility] [address] [place], [address] Phone: [phone]   Fax:  [phone]  Physical Therapy Treatment  Patient Details  Name: Alexis Mcdonald MRN: [id number] Date of Birth: 1951-11-21 Referring Provider (PT): Candelaria Stagers MD   Encounter Date: 10/21/2019  PT End of Session - 10/21/19 1607    Visit Number  2    Number of Visits  13    Date for PT Re-Evaluation  11/30/19    PT Start Time  2023    PT Stop Time  1600    PT Time Calculation (min)  43 min    Activity Tolerance  Patient tolerated treatment well;No increased pain    Behavior During Therapy  WFL for tasks assessed/performed       Past Medical History:  Diagnosis Date  . Borderline osteopenia    prior DEXA at [facility]   . Cancer ([place])    Melanoma removed from right hip  . Colon polyp 2009   next one due 2012  . 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 History of kidney stones   . Hypothyroidism   . Kidney stones   . Neutropenia    no infections,  bone marrow biopsy by [name] next week  . renal calculi    s/p lithotripsy, [place]  . Thyroid disease    hypothyroidism    Past Surgical History:  Procedure Laterality Date  . COLONOSCOPY  03/23/2010   Diverticulosis  . COLONOSCOPY W/ BIOPSIES  03/14/2006   Tubular adenoma of the rectum x2, upper lesion with focal high-grade dysplasia.  Polyps 9 mm in size.  Marland Kitchen COLONOSCOPY WITH PROPOFOL N/A 10/21/2018   Procedure: COLONOSCOPY WITH PROPOFOL;  Surgeon: Robert Bellow, MD;  Location: [facility] ENDOSCOPY;  Service: Endoscopy;  Laterality: N/A;  . DILATION AND CURETTAGE OF UTERUS    . JOINT REPLACEMENT  2005   done in [place]  . kidney stones removal    . MELANOMA EXCISION  06/29/2016  . right hip replacement    . SHOULDER ARTHROSCOPY WITH OPEN ROTATOR CUFF REPAIR Left 12/01/2018   Procedure: SHOULDER ARTHROSCOPY WITH OPEN ROTATOR CUFF  REPAIR;  Surgeon: Corky Mull, MD;  Location: [facility] ORS;  Service: Orthopedics;  Laterality: Left;    There were no vitals filed for this visit.  Subjective Assessment - 10/21/19 1518    Subjective  Patient reports some soreness following manual therapy last session but no pain today. Has been doing exercises with good results and minimal pain.    Pertinent History  L rotator cuff repair with tenodesis 12/19    Limitations  Lifting;House hold activities    How long can you sit comfortably?  unlimited    How long can you stand comfortably?  unlimited    How long can you walk comfortably?  unlimited    Patient Stated Goals  Return to prior activities    Currently in Pain?  No/denies    Pain Orientation  Right    Pain Type  Chronic pain    Pain Onset  More than a month ago    Multiple Pain Sites  Yes    Pain Score  6    Pain Location  Wrist    Pain Orientation  Left    Pain Descriptors / Indicators  Sharp    Pain Type  Chronic pain    Pain Onset  More than a month ago  TREATMENT MT Soft tissue mobilization, myofascial release, cross friction massage, and trigger point release to R flexor mass, common flexor tendon, APL, EPL, pronator teres insertion to calm irritated tissues, promote remodeling, realign scar tissue and promote tissue extensibility x 15 min Distraction of L ulnolunate joint to improve tolerance for WB therapy Mobilizaiton of L DRUJ volar grade 2-3 x 20 to improve tolerance for WB therapy  TE Wrist flexion/extension 2#, 3#, 4# x 10 ea without provocation Biceps/pronated curls with elbow supported 4# 2 x 10 with without provocation Standing combined biceps/pronated curls 4# x 10 provokes minor pain at end range elbow extension in pronation  Wrist flexor/extensor stretch 3 x 30 sec to promote tissue realignment and strength TE for strengthening and realignment of tendon fibers of common extensor and flexor tendons  Spiral McConnell taping of L ulnar head to  restore DRUJ alignment and allow improved weight bearing over BLE for improved tolerance with therapeutic exercises. Patient reports markedly reduced pain in L wrist with modified quad plank following taping.  Note wrist flexion compensation with biceps curl suggesting wrist flexor dominance with UE flexion activities, possibly contributory.      PT Education - 10/21/19 1520    Education Details  form/technique with exercise    Person(s) Educated  Patient    Methods  Explanation;Demonstration;Tactile cues;Verbal cues    Comprehension  Verbalized understanding;Returned demonstration;Verbal cues required;Tactile cues required       PT Short Term Goals - 10/19/19 1313      PT SHORT TERM GOAL #1   Title  Patient will be complaint with HEP as adjunct to therapy to improve recovery and reduce total number of visits.    Baseline  HEP given    Time  1    Period  Weeks    Status  New    Target Date  10/26/19        PT Long Term Goals - 10/19/19 1313      PT LONG TERM GOAL #1   Title  Patient will reduce pain to 1/10 with aggravating activity    Baseline  4/10 with aggravating activity    Time  6    Period  Weeks    Status  New    Target Date  11/30/19      PT LONG TERM GOAL #2   Title  Patient will report no increased symptoms with 30 min driving with standard steering wheel grip for safety with ADLs.    Baseline  Modified grip for driving    Time  6    Period  Weeks    Status  New    Target Date  11/30/19      PT LONG TERM GOAL #3   Title  Patient will demonstrate ability to hold plank with good form for 30 sec with no increase in symptoms.    Baseline  Cannot hold plank due to pain    Time  6    Period  Weeks    Status  New    Target Date  11/30/19            Plan - 10/21/19 1608    Clinical Impression Statement  Patient demonstrates good progress in function with reduced pain since last visit. Reduced TTP in flexor mass and improved tolerance with therapeutic  exercise. Remaining deficits in multi-joint movements under load. Plan to progress HEP next visit with gripping exercises. Patient will benefit from skilled physical therapy to increase strength and regain function  for return to PLOF.    Personal Factors and Comorbidities  Age;Comorbidity 1    Comorbidities  L rotator cuff repair with tenodesis    Examination-Participation Restrictions  Driving;Other   Exercise   Stability/Clinical Decision Making  Stable/Uncomplicated    Rehab Potential  Excellent    PT Frequency  2x / week    PT Duration  6 weeks    PT Treatment/Interventions  Moist Heat;Ultrasound;Electrical Stimulation;Cryotherapy;Therapeutic activities;Therapeutic exercise;Patient/family education;Neuromuscular re-education;Manual techniques;Passive range of motion;Dry needling;Taping    PT Next Visit Plan  Advance MT and TE; assess functional movements; grip strength; grip exercises    PT Home Exercise Plan  Eccentric wrist flexion/ext, resisted sup/pron, low rows, bicep/brachialis curls    Consulted and Agree with Plan of Care  Patient       Patient will benefit from skilled therapeutic intervention in order to improve the following deficits and impairments:  Decreased range of motion, Increased fascial restricitons, Pain, Decreased endurance, Impaired UE functional use, Hypomobility, Impaired flexibility, Decreased mobility, Decreased strength  Visit Diagnosis: Epicondylitis elbow, medial, right     Problem List Patient Active Problem List   Diagnosis Date Noted  . S/P shoulder surgery 07/09/2019  . Insomnia due to stress 07/09/2019  . Post-menopausal atrophic vaginitis 03/10/2019  . Cough productive of clear sputum 03/10/2019  . Drug-induced neutropenia ([place]) 07/08/2018  . Abnormal weight loss 07/08/2018  . Abnormal blood level of cobalt 06/27/2018  . 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 Melanoma of skin ([place]) 07/27/2015  . History of total right hip arthroplasty 02/09/2015   . Osteopenia 05/25/2014  . 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 History of nephrolithiasis 07/13/2013  . Adverse drug reaction 06/29/2013  . History of adenomatous polyp of colon 06/01/2013  . Hemochromatosis carrier   . Initial Medicare annual wellness visit 05/07/2013  . Hypothyroidism 09/25/2011  . History of idiopathic seizure 09/25/2011  . Screening for cervical cancer 09/25/2011  . Screening for breast cancer 09/25/2011  . Neutropenia ([place])     Virgia Land, SPT 10/21/2019, 4:14 PM   Session was supervised by licensed PT. Milana Na Porfilio, MPT [facility] [address] [place], [address] Phone: [phone]   Fax:  [phone]  Name: Alexis Mcdonald MRN: [id number] Date of Birth: [birth year]

## 2019-10-26 ENCOUNTER — Ambulatory Visit: Payer: Medicare Other

## 2019-10-26 ENCOUNTER — Other Ambulatory Visit: Payer: Self-pay

## 2019-10-26 DIAGNOSIS — M7701 Medial epicondylitis, right elbow: Secondary | ICD-10-CM | POA: Diagnosis not present

## 2019-10-26 NOTE — Therapy (Signed)
[facility] [address], [place], [address] Phone: [phone]   Fax:  [phone]  Physical Therapy Treatment  Patient Details  Name: Alexis Mcdonald MRN: [id number] Date of Birth: 02/17/1951 Referring Provider (PT): Candelaria Stagers MD   Encounter Date: 10/26/2019  PT End of Session - 10/26/19 0815    Visit Number  3    Number of Visits  13    Date for PT Re-Evaluation  11/30/19    PT Start Time  0815    PT Stop Time  0900    PT Time Calculation (min)  45 min    Activity Tolerance  Patient tolerated treatment well;No increased pain    Behavior During Therapy  WFL for tasks assessed/performed       Past Medical History:  Diagnosis Date  . Borderline osteopenia    prior DEXA at [facility]   . Cancer ([place])    Melanoma removed from right hip  . Colon polyp 2009   next one due 2012  . 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 History of kidney stones   . Hypothyroidism   . Kidney stones   . Neutropenia    no infections,  bone marrow biopsy by [name] next week  . renal calculi    s/p lithotripsy, [place]  . Thyroid disease    hypothyroidism    Past Surgical History:  Procedure Laterality Date  . COLONOSCOPY  03/23/2010   Diverticulosis  . COLONOSCOPY W/ BIOPSIES  03/14/2006   Tubular adenoma of the rectum x2, upper lesion with focal high-grade dysplasia.  Polyps 9 mm in size.  Marland Kitchen COLONOSCOPY WITH PROPOFOL N/A 10/21/2018   Procedure: COLONOSCOPY WITH PROPOFOL;  Surgeon: Robert Bellow, MD;  Location: [facility] ENDOSCOPY;  Service: Endoscopy;  Laterality: N/A;  . DILATION AND CURETTAGE OF UTERUS    . JOINT REPLACEMENT  2005   done in [place]  . kidney stones removal    . MELANOMA EXCISION  06/29/2016  . right hip replacement    . SHOULDER ARTHROSCOPY WITH OPEN ROTATOR CUFF REPAIR Left 12/01/2018   Procedure: SHOULDER ARTHROSCOPY WITH OPEN ROTATOR CUFF  REPAIR;  Surgeon: Corky Mull, MD;  Location: [facility] ORS;  Service: Orthopedics;  Laterality: Left;    There were no vitals filed for this visit.  Subjective Assessment - 10/26/19 0817    Subjective  Patient reports mild soreness on the epicondyle especially with pressure or with elbow unsupported (at full extension). L wrist felt much better for "several days" with WB before regressing.    Pertinent History  L rotator cuff repair with tenodesis 12/19    Limitations  Lifting;House hold activities    How long can you sit comfortably?  unlimited    How long can you stand comfortably?  unlimited    How long can you walk comfortably?  unlimited    Patient Stated Goals  Return to prior activities    Currently in Pain?  No/denies    Pain Onset  More than a month ago    Multiple Pain Sites  Yes    Pain Score  6    Pain Location  Wrist    Pain Orientation  Left    Pain Descriptors / Indicators  Sharp    Pain Type  Chronic pain    Pain Onset  More than a month ago    Pain Frequency  Intermittent  TREATMENT  TE  Wrist pronation/supination 2# elbow supported x 5 - no pain Wrist flexion 2# elbow supported x 5 - no pain Wrist extension 2# elbow supported x 5 - no pain Biceps curl/brachialis curl 2# unsupported - provokes pain at end range  TG lv 12 Chin-ups 3 x 5 to assess response - patient reports no pain. Mild wrist flexor compensation noted at end range Pull ups x 5 to assess response - patient demonstrates good form with exercise Bicep curls 2 x 10 with cues to avoid wrist flexion compensation Bicep.brachialis curl with pronation/supination at end ROM x 10 Body blade arm extended x 1 min horizontal, 1 min vertical   MT  Dry needling to flexor mass on R to promote healing and restore optimal tissue alignment for reduced pain with ADLs  Response after needling: soreness but able to move better without sharp pain with pronation  Myofascial release, soft tissue  mobilization, and cross friction massage to flexor mass, wrist extensors to promote increased tissue perfusion and recovery post exercise  Advised pt on gym program as follows: avoid chin-ups at present, clear to perform moderate intensity [place] shoulder exercises with attention to flexor compensation  PT Education - 10/26/19 0815    Education Details  Form/technique with exercise. Risks and benefits of DN. Appropriate exercises at gym.    Person(s) Educated  Patient    Methods  Explanation;Demonstration;Tactile cues;Verbal cues    Comprehension  Verbalized understanding;Returned demonstration;Verbal cues required;Tactile cues required       PT Short Term Goals - 10/26/19 0958      PT SHORT TERM GOAL #1   Title  Patient will be complaint with HEP as adjunct to therapy to improve recovery and reduce total number of visits.    Baseline  HEP given    Time  1    Period  Weeks    Status  Achieved    Target Date  10/26/19        PT Long Term Goals - 10/19/19 1313      PT LONG TERM GOAL #1   Title  Patient will reduce pain to 1/10 with aggravating activity    Baseline  4/10 with aggravating activity    Time  6    Period  Weeks    Status  New    Target Date  11/30/19      PT LONG TERM GOAL #2   Title  Patient will report no increased symptoms with 30 min driving with standard steering wheel grip for safety with ADLs.    Baseline  Modified grip for driving    Time  6    Period  Weeks    Status  New    Target Date  11/30/19      PT LONG TERM GOAL #3   Title  Patient will demonstrate ability to hold plank with good form for 30 sec with no increase in symptoms.    Baseline  Cannot hold plank due to pain    Time  6    Period  Weeks    Status  New    Target Date  11/30/19            Plan - 10/26/19 8003    Clinical Impression Statement  Patient demonstrates good comprehension of NMR for reduced wrist flexor compensation. Good tolerance for DN with patient reporting release  of painful sensation post tx. Patient demonstrates good form with total gym chin-ups and pull-ups but may require testing  under additional load. Good tolerance of therapeutic exercises but quick fatigue with body blade. HEP grip progression deferred in favor of UE strengthening. Patient continues to demonstrate progress with reduced pain. Patient will benefit from physical therapy to increase strength and regain function for return to PLOF.    Personal Factors and Comorbidities  Age;Comorbidity 1    Comorbidities  L rotator cuff repair with tenodesis    Examination-Participation Restrictions  Driving;Other   Exercise   Stability/Clinical Decision Making  Stable/Uncomplicated    Rehab Potential  Excellent    PT Frequency  2x / week    PT Duration  6 weeks    PT Treatment/Interventions  Moist Heat;Ultrasound;Electrical Stimulation;Cryotherapy;Therapeutic activities;Therapeutic exercise;Patient/family education;Neuromuscular re-education;Manual techniques;Passive range of motion;Dry needling;Taping    PT Next Visit Plan  Advance TE and re-education    PT Home Exercise Plan  bicep/brachialis curls, resisted sup/pron    Consulted and Agree with Plan of Care  Patient       Patient will benefit from skilled therapeutic intervention in order to improve the following deficits and impairments:  Decreased range of motion, Increased fascial restricitons, Pain, Decreased endurance, Impaired UE functional use, Hypomobility, Impaired flexibility, Decreased mobility, Decreased strength  Visit Diagnosis: Epicondylitis elbow, medial, right     Problem List Patient Active Problem List   Diagnosis Date Noted  . S/P shoulder surgery 07/09/2019  . Insomnia due to stress 07/09/2019  . Post-menopausal atrophic vaginitis 03/10/2019  . Cough productive of clear sputum 03/10/2019  . Drug-induced neutropenia ([place]) 07/08/2018  . Abnormal weight loss 07/08/2018  . Abnormal blood level of cobalt 06/27/2018  .  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 Melanoma of skin ([place]) 07/27/2015  . History of total right hip arthroplasty 02/09/2015  . Osteopenia 05/25/2014  . 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 History of nephrolithiasis 07/13/2013  . Adverse drug reaction 06/29/2013  . History of adenomatous polyp of colon 06/01/2013  . Hemochromatosis carrier   . Initial Medicare annual wellness visit 05/07/2013  . Hypothyroidism 09/25/2011  . History of idiopathic seizure 09/25/2011  . Screening for cervical cancer 09/25/2011  . Screening for breast cancer 09/25/2011  . Neutropenia ([place])     Virgia Land, SPT 10/26/2019, 10:05 AM  [facility] [address], [place], [address] Phone: [phone]   Fax:  [phone]  Name: Milliana Reddoch MRN: [id number] Date of Birth: 09-19-1951

## 2019-10-28 ENCOUNTER — Ambulatory Visit: Payer: Medicare Other

## 2019-10-28 ENCOUNTER — Other Ambulatory Visit: Payer: Self-pay

## 2019-10-28 DIAGNOSIS — M7701 Medial epicondylitis, right elbow: Secondary | ICD-10-CM | POA: Diagnosis not present

## 2019-10-28 NOTE — Therapy (Signed)
[facility] [address] [place], [address] Phone: [phone]   Fax:  [phone]  Physical Therapy Treatment  Patient Details  Name: Alexis Mcdonald MRN: [id number] Date of Birth: Apr 11, 1951 Referring Provider (PT): Candelaria Stagers MD   Encounter Date: 10/28/2019  PT End of Session - 10/28/19 0821    Visit Number  4    Number of Visits  13    Date for PT Re-Evaluation  11/30/19    PT Start Time  0817    PT Stop Time  0900    PT Time Calculation (min)  43 min    Activity Tolerance  Patient tolerated treatment well;No increased pain    Behavior During Therapy  WFL for tasks assessed/performed       Past Medical History:  Diagnosis Date  . Borderline osteopenia    prior DEXA at [facility]   . Cancer ([place])    Melanoma removed from right hip  . Colon polyp 2009   next one due 2012  . 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 History of kidney stones   . Hypothyroidism   . Kidney stones   . Neutropenia    no infections,  bone marrow biopsy by [name] next week  . renal calculi    s/p lithotripsy, [place]  . Thyroid disease    hypothyroidism    Past Surgical History:  Procedure Laterality Date  . COLONOSCOPY  03/23/2010   Diverticulosis  . COLONOSCOPY W/ BIOPSIES  03/14/2006   Tubular adenoma of the rectum x2, upper lesion with focal high-grade dysplasia.  Polyps 9 mm in size.  Marland Kitchen COLONOSCOPY WITH PROPOFOL N/A 10/21/2018   Procedure: COLONOSCOPY WITH PROPOFOL;  Surgeon: Robert Bellow, MD;  Location: [facility] ENDOSCOPY;  Service: Endoscopy;  Laterality: N/A;  . DILATION AND CURETTAGE OF UTERUS    . JOINT REPLACEMENT  2005   done in [place]  . kidney stones removal    . MELANOMA EXCISION  06/29/2016  . right hip replacement    . SHOULDER ARTHROSCOPY WITH OPEN ROTATOR CUFF REPAIR Left 12/01/2018   Procedure: SHOULDER ARTHROSCOPY WITH OPEN ROTATOR CUFF  REPAIR;  Surgeon: Corky Mull, MD;  Location: [facility] ORS;  Service: Orthopedics;  Laterality: Left;    There were no vitals filed for this visit.  Subjective Assessment - 10/28/19 0817    Subjective  Patient reports soreness following DN last session with recurrence of epicondylar pain yesterday.    Pertinent History  L rotator cuff repair with tenodesis 12/19    Limitations  Lifting;House hold activities    How long can you sit comfortably?  unlimited    How long can you stand comfortably?  unlimited    How long can you walk comfortably?  unlimited    Patient Stated Goals  Return to prior activities    Currently in Pain?  Yes    Pain Location  Elbow    Pain Orientation  Right    Pain Descriptors / Indicators  Aching    Pain Type  Chronic pain    Pain Onset  More than a month ago    Pain Frequency  Intermittent    Multiple Pain Sites  Yes    Pain Score  6    Pain Location  Wrist    Pain Orientation  Left    Pain Descriptors / Indicators  Sharp    Pain Type  Chronic  pain    Pain Onset  More than a month ago    Pain Frequency  Intermittent       TREATMENT MT Soft tissue mobilization, myofascial release, and cross-friction massage to R flexor mass, pronator teres, extensors for reduced pain and improved tissue extensibility and remodeling L DRUJ mobilization into supination for correction of ulnar positional fault grade 2-3 20 reps  TE R pron/sup 2# R wrist ext 2# R wrist flex 2# D2 pattern with RUE 2# Mild limitation noted in active supination R likely due to tightness in pronators. MET for correction of ulnar positional fault Patient reports no improvement in L wrist pain with weight bearing McConnell spiral taping to correct ulnar positional fault - patient reports minimal pain with weight bearing Mod quad WB shift x 20 Mod quad supination on spin discs x 20  TE for remodeling and strengthening of R flexor and extensor tendons, correction of wrist compensation, and  improved weight bearing on RUE     PT Education - 10/28/19 0820    Education Details  Form/technique with exercise; advice on activity tolerance and relative rest    Person(s) Educated  Patient    Methods  Explanation;Demonstration;Tactile cues;Verbal cues    Comprehension  Verbalized understanding;Returned demonstration;Verbal cues required;Tactile cues required       PT Short Term Goals - 10/26/19 0958      PT SHORT TERM GOAL #1   Title  Patient will be complaint with HEP as adjunct to therapy to improve recovery and reduce total number of visits.    Baseline  HEP given    Time  1    Period  Weeks    Status  Achieved    Target Date  10/26/19        PT Long Term Goals - 10/19/19 1313      PT LONG TERM GOAL #1   Title  Patient will reduce pain to 1/10 with aggravating activity    Baseline  4/10 with aggravating activity    Time  6    Period  Weeks    Status  New    Target Date  11/30/19      PT LONG TERM GOAL #2   Title  Patient will report no increased symptoms with 30 min driving with standard steering wheel grip for safety with ADLs.    Baseline  Modified grip for driving    Time  6    Period  Weeks    Status  New    Target Date  11/30/19      PT LONG TERM GOAL #3   Title  Patient will demonstrate ability to hold plank with good form for 30 sec with no increase in symptoms.    Baseline  Cannot hold plank due to pain    Time  6    Period  Weeks    Status  New    Target Date  11/30/19            Plan - 10/28/19 1225    Clinical Impression Statement  Patient presents with mild flare-up of pain responding well to manual therapy and gentle active movement. L ulnolunate jt mobile with good strength but functional weight-bearing not improved with mobilization or MET. Patient shows good response to taping and mild improvement of compensatory wrist flexion/extension with elbow and shoulder exercises requiring fewer cues this session. Pationt will benefit from  physical therapy to increase strength and regain functionf or return to PLOF.  Personal Factors and Comorbidities  Age;Comorbidity 1    Comorbidities  L rotator cuff repair with tenodesis    Examination-Participation Restrictions  Driving;Other   Exercise   Stability/Clinical Decision Making  Stable/Uncomplicated    Rehab Potential  Excellent    PT Frequency  2x / week    PT Duration  6 weeks    PT Treatment/Interventions  Moist Heat;Ultrasound;Electrical Stimulation;Cryotherapy;Therapeutic activities;Therapeutic exercise;Patient/family education;Neuromuscular re-education;Manual techniques;Passive range of motion;Dry needling;Taping    PT Next Visit Plan  Advance TE and re-education    PT Home Exercise Plan  bicep/brachialis curls, resisted sup/pron    Consulted and Agree with Plan of Care  Patient       Patient will benefit from skilled therapeutic intervention in order to improve the following deficits and impairments:  Decreased range of motion, Increased fascial restricitons, Pain, Decreased endurance, Impaired UE functional use, Hypomobility, Impaired flexibility, Decreased mobility, Decreased strength  Visit Diagnosis: Epicondylitis elbow, medial, right     Problem List Patient Active Problem List   Diagnosis Date Noted  . S/P shoulder surgery 07/09/2019  . Insomnia due to stress 07/09/2019  . Post-menopausal atrophic vaginitis 03/10/2019  . Cough productive of clear sputum 03/10/2019  . Drug-induced neutropenia ([place]) 07/08/2018  . Abnormal weight loss 07/08/2018  . Abnormal blood level of cobalt 06/27/2018  . 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 Melanoma of skin ([place]) 07/27/2015  . History of total right hip arthroplasty 02/09/2015  . Osteopenia 05/25/2014  . 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 History of nephrolithiasis 07/13/2013  . Adverse drug reaction 06/29/2013  . History of adenomatous polyp of colon 06/01/2013  . Hemochromatosis carrier   . Initial  Medicare annual wellness visit 05/07/2013  . Hypothyroidism 09/25/2011  . History of idiopathic seizure 09/25/2011  . Screening for cervical cancer 09/25/2011  . Screening for breast cancer 09/25/2011  . Neutropenia ([place])     Virgia Land, SPT 10/28/2019, 12:40 PM  [facility] [address], [place], [address] Phone: [phone]   Fax:  [phone]  Name: Sherine Cortese MRN: [id number] Date of Birth: [birth year]

## 2019-11-02 ENCOUNTER — Other Ambulatory Visit: Payer: Self-pay

## 2019-11-02 ENCOUNTER — Ambulatory Visit: Payer: Medicare Other | Attending: Sports Medicine

## 2019-11-02 DIAGNOSIS — M7701 Medial epicondylitis, right elbow: Secondary | ICD-10-CM

## 2019-11-02 NOTE — Therapy (Signed)
[facility] [address], [place], [address] Phone: [phone]   Fax:  [phone]  Physical Therapy Treatment  Patient Details  Name: Alexis Mcdonald MRN: [id number] Date of Birth: Mar 24, 1951 Referring Provider (PT): Candelaria Stagers MD   Encounter Date: 11/02/2019  PT End of Session - 11/02/19 1209    Visit Number  5    Number of Visits  13    Date for PT Re-Evaluation  11/30/19    PT Start Time  0935    PT Stop Time  1020    PT Time Calculation (min)  45 min    Activity Tolerance  Patient tolerated treatment well;No increased pain    Behavior During Therapy  WFL for tasks assessed/performed       Past Medical History:  Diagnosis Date  . Borderline osteopenia    prior DEXA at [facility]   . Cancer ([place])    Melanoma removed from right hip  . Colon polyp 2009   next one due 2012  . 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 History of kidney stones   . Hypothyroidism   . Kidney stones   . Neutropenia    no infections,  bone marrow biopsy by [name] next week  . renal calculi    s/p lithotripsy, [place]  . Thyroid disease    hypothyroidism    Past Surgical History:  Procedure Laterality Date  . COLONOSCOPY  03/23/2010   Diverticulosis  . COLONOSCOPY W/ BIOPSIES  03/14/2006   Tubular adenoma of the rectum x2, upper lesion with focal high-grade dysplasia.  Polyps 9 mm in size.  Marland Kitchen COLONOSCOPY WITH PROPOFOL N/A 10/21/2018   Procedure: COLONOSCOPY WITH PROPOFOL;  Surgeon: Robert Bellow, MD;  Location: [facility] END[facility]COPY;  Service: Endoscopy;  Laterality: N/A;  . DILATION AND CURETTAGE OF UTERUS    . JOINT REPLACEMENT  2005   done in [place]  . kidney stones removal    . MELANOMA EXCISION  06/29/2016  . right hip replacement    . SHOULDER ARTHROSCOPY WITH OPEN ROTATOR CUFF REPAIR Left 12/01/2018   Procedure: SHOULDER ARTHROSCOPY WITH OPEN ROTATOR CUFF  REPAIR;  Surgeon: Corky Mull, MD;  Location: [facility] ORS;  Service: Orthopedics;  Laterality: Left;    There were no vitals filed for this visit.  Subjective Assessment - 11/02/19 0937    Subjective  Patient reports "not a good week", more painful around elbow than previously. Attempted self-massage but this aggravated the pain. Still has pain with extension and reports mild pain in proximal superior upper arm. Tape helped L wrist but pt still cannot maintain WB over wrist in full extension.    Pertinent History  L rotator cuff repair with tenodesis 12/19    Limitations  Lifting;House hold activities    How long can you sit comfortably?  unlimited    How long can you stand comfortably?  unlimited    How long can you walk comfortably?  unlimited    Patient Stated Goals  Return to prior activities    Pain Score  0-No pain    Pain Onset  More than a month ago    Multiple Pain Sites  Yes    Pain Score  6    Pain Location  Wrist    Pain Orientation  Left    Pain Descriptors / Indicators  Sharp    Pain Type  Chronic pain  Pain Onset  More than a month ago        TREATMENT  MT Note reduced focal tissue tension in R flexor mass, R tendons DN to R flexor mass for reduced pain, improved tissue extensibility, reduced spasm, and improved remodeling   TE Gentle AROM following DN to improve therapy tolerance and promote perfusion for improved tissue remodeling Review of stretching 2 x 30 sec, progressed with stretches for flexors of individual digits TE hammer pronation/supination yellow TB resistance 2 x 10 Mod quad weight shifts over L wrist with concurrent isometric hold into ER/supination 2 x 10  Thorough discussion of HEP progression including resistance, reps/sets, use of minimal ice to recover, and expected response     PT Education - 11/02/19 1208    Education Details  form/technique with exercise; tissue healing/remodeling timeline    Person(s) Educated  Patient    Methods   Explanation;Demonstration;Tactile cues;Verbal cues;Handout    Comprehension  Verbalized understanding;Returned demonstration;Verbal cues required;Tactile cues required       PT Short Term Goals - 10/26/19 0958      PT SHORT TERM GOAL #1   Title  Patient will be complaint with HEP as adjunct to therapy to improve recovery and reduce total number of visits.    Baseline  HEP given    Time  1    Period  Weeks    Status  Achieved    Target Date  10/26/19        PT Long Term Goals - 10/19/19 1313      PT LONG TERM GOAL #1   Title  Patient will reduce pain to 1/10 with aggravating activity    Baseline  4/10 with aggravating activity    Time  6    Period  Weeks    Status  New    Target Date  11/30/19      PT LONG TERM GOAL #2   Title  Patient will report no increased symptoms with 30 min driving with standard steering wheel grip for safety with ADLs.    Baseline  Modified grip for driving    Time  6    Period  Weeks    Status  New    Target Date  11/30/19      PT LONG TERM GOAL #3   Title  Patient will demonstrate ability to hold plank with good form for 30 sec with no increase in symptoms.    Baseline  Cannot hold plank due to pain    Time  6    Period  Weeks    Status  New    Target Date  11/30/19            Plan - 11/02/19 1210    Clinical Impression Statement  Patient voices frustration with progress but responds well to education regarding tissue healing timeline for tendinopathies. Pain in R shoulder consistent with delayed onsent muscle soreness in biceps following HEP and is not felt to be a concern at this time. Tolerates DN well. Tolerates challenging band-based supination/pronation progression on R with mild compensation, corrects with cues. Pain with WB on L improved with concurrent shoulder ER/supination isometric to restore joint congruency. Patient will benefit from physical therapy to increase strength and regain function for return to PLOF.    Personal  Factors and Comorbidities  Age;Comorbidity 1    Comorbidities  L rotator cuff repair with tenodesis    Examination-Participation Restrictions  Driving;Other   Exercise   Stability/Clinical Decision  Making  Stable/Uncomplicated    Rehab Potential  Excellent    PT Frequency  2x / week    PT Duration  6 weeks    PT Treatment/Interventions  Moist Heat;Ultrasound;Electrical Stimulation;Cryotherapy;Therapeutic activities;Therapeutic exercise;Patient/family education;Neuromuscular re-education;Manual techniques;Passive range of motion;Dry needling;Taping    PT Next Visit Plan  Advance TE and re-education    PT Home Exercise Plan  resisted sup/pron with band, modified quad wt shift over L wrist with isometric ER/sup    Consulted and Agree with Plan of Care  Patient       Patient will benefit from skilled therapeutic intervention in order to improve the following deficits and impairments:  Decreased range of motion, Increased fascial restricitons, Pain, Decreased endurance, Impaired UE functional use, Hypomobility, Impaired flexibility, Decreased mobility, Decreased strength  Visit Diagnosis: Epicondylitis elbow, medial, right     Problem List Patient Active Problem List   Diagnosis Date Noted  . S/P shoulder surgery 07/09/2019  . Insomnia due to stress 07/09/2019  . Post-menopausal atrophic vaginitis 03/10/2019  . Cough productive of clear sputum 03/10/2019  . Drug-induced neutropenia ([place]) 07/08/2018  . Abnormal weight loss 07/08/2018  . Abnormal blood level of cobalt 06/27/2018  . 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 Melanoma of skin ([place]) 07/27/2015  . History of total right hip arthroplasty 02/09/2015  . Osteopenia 05/25/2014  . 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 History of nephrolithiasis 07/13/2013  . Adverse drug reaction 06/29/2013  . History of adenomatous polyp of colon 06/01/2013  . Hemochromatosis carrier   . Initial Medicare annual wellness visit 05/07/2013  .  Hypothyroidism 09/25/2011  . History of idiopathic seizure 09/25/2011  . Screening for cervical cancer 09/25/2011  . Screening for breast cancer 09/25/2011  . Neutropenia ([place])     Virgia Land, SPT 11/02/2019, 12:23 PM  [facility] [address], [place], [address] Phone: [phone]   Fax:  [phone]  Name: Alima Naser MRN: [id number] Date of Birth: March 12, 1951

## 2019-11-04 ENCOUNTER — Other Ambulatory Visit: Payer: Self-pay

## 2019-11-04 ENCOUNTER — Ambulatory Visit: Payer: Medicare Other

## 2019-11-04 DIAGNOSIS — M7701 Medial epicondylitis, right elbow: Secondary | ICD-10-CM | POA: Diagnosis not present

## 2019-11-04 DIAGNOSIS — F418 Other specified anxiety disorders: Secondary | ICD-10-CM | POA: Diagnosis not present

## 2019-11-04 NOTE — Therapy (Signed)
[facility] [address] [place], [address] Phone: [phone]   Fax:  [phone]  Physical Therapy Treatment  Patient Details  Name: Alexis Mcdonald MRN: [id number] Date of Birth: 1951-07-02 Referring Provider (PT): Candelaria Stagers MD   Encounter Date: 11/04/2019  PT End of Session - 11/04/19 1032    Visit Number  6    Number of Visits  13    Date for PT Re-Evaluation  11/30/19    Authorization Type  6 / 10    PT Start Time  0905    PT Stop Time  0945    PT Time Calculation (min)  40 min    Activity Tolerance  Patient tolerated treatment well;No increased pain    Behavior During Therapy  WFL for tasks assessed/performed       Past Medical History:  Diagnosis Date  . Borderline osteopenia    prior DEXA at [facility]   . Cancer ([place])    Melanoma removed from right hip  . Colon polyp 2009   next one due 2012  . 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 History of kidney stones   . Hypothyroidism   . Kidney stones   . Neutropenia    no infections,  bone marrow biopsy by [name] next week  . renal calculi    s/p lithotripsy, [place]  . Thyroid disease    hypothyroidism    Past Surgical History:  Procedure Laterality Date  . COLONOSCOPY  03/23/2010   Diverticulosis  . COLONOSCOPY W/ BIOPSIES  03/14/2006   Tubular adenoma of the rectum x2, upper lesion with focal high-grade dysplasia.  Polyps 9 mm in size.  Marland Kitchen COLONOSCOPY WITH PROPOFOL N/A 10/21/2018   Procedure: COLONOSCOPY WITH PROPOFOL;  Surgeon: Robert Bellow, MD;  Location: [facility] ENDOSCOPY;  Service: Endoscopy;  Laterality: N/A;  . DILATION AND CURETTAGE OF UTERUS    . JOINT REPLACEMENT  2005   done in [place]  . kidney stones removal    . MELANOMA EXCISION  06/29/2016  . right hip replacement    . SHOULDER ARTHROSCOPY WITH OPEN ROTATOR CUFF REPAIR Left 12/01/2018   Procedure: SHOULDER  ARTHROSCOPY WITH OPEN ROTATOR CUFF REPAIR;  Surgeon: Corky Mull, MD;  Location: [facility] ORS;  Service: Orthopedics;  Laterality: Left;    There were no vitals filed for this visit.  Subjective Assessment - 11/04/19 0906    Subjective  Patient reports soreness in medial epicondyle R, reports going easy. Reports avoiding pool due to feeling flared up yesterday. Reports that L wrist feels better with supination but still cannot maintain down dog.    Pertinent History  L rotator cuff repair with tenodesis 12/19    Limitations  Lifting;House hold activities    How long can you sit comfortably?  unlimited    How long can you stand comfortably?  unlimited    How long can you walk comfortably?  unlimited    Patient Stated Goals  Return to prior activities    Currently in Pain?  Yes    Pain Score  4     Pain Location  Elbow    Pain Orientation  Right    Pain Descriptors / Indicators  Aching    Pain Type  Chronic pain    Pain Onset  More than a month ago    Pain Frequency  Intermittent    Multiple Pain Sites  Yes  Pain Score  6    Pain Location  Wrist    Pain Orientation  Left    Pain Descriptors / Indicators  Sharp    Pain Type  Chronic pain    Pain Onset  More than a month ago    Pain Frequency  Intermittent        TREATMENT  MT Soft tissue mobilization and trigger point release to R biceps, pectorals, latissimus dorsi to improve tissue extensibility, reduce pain, and improve therapy tolerance Shoulder screen: patient demonstrates full ROM B with good strength B grossly 4+/5   TE Yellow theraband resistance sup/pron L/R x 10 with HEP instruction Mod quad weight shifts onto L wrist x 10 with concurrent supination/ER isometric to maintain ulnolunate jt congruency Mod quad pushups onto L wrist x 10 with supination/ER isometric to maintain ulnolunate jt congruency Eccentric flexion wrist 4# 7# 10# x 10 each to progress tissue remodeling    PT Education - 11/04/19 0911     Education Details  form/technique with exercise    Person(s) Educated  Patient    Methods  Explanation;Demonstration;Tactile cues;Verbal cues    Comprehension  Verbalized understanding;Returned demonstration;Verbal cues required;Tactile cues required       PT Short Term Goals - 10/26/19 0958      PT SHORT TERM GOAL #1   Title  Patient will be complaint with HEP as adjunct to therapy to improve recovery and reduce total number of visits.    Baseline  HEP given    Time  1    Period  Weeks    Status  Achieved    Target Date  10/26/19        PT Long Term Goals - 10/19/19 1313      PT LONG TERM GOAL #1   Title  Patient will reduce pain to 1/10 with aggravating activity    Baseline  4/10 with aggravating activity    Time  6    Period  Weeks    Status  New    Target Date  11/30/19      PT LONG TERM GOAL #2   Title  Patient will report no increased symptoms with 30 min driving with standard steering wheel grip for safety with ADLs.    Baseline  Modified grip for driving    Time  6    Period  Weeks    Status  New    Target Date  11/30/19      PT LONG TERM GOAL #3   Title  Patient will demonstrate ability to hold plank with good form for 30 sec with no increase in symptoms.    Baseline  Cannot hold plank due to pain    Time  6    Period  Weeks    Status  New    Target Date  11/30/19            Plan - 11/04/19 1033    Clinical Impression Statement  Patient reports with focal tenderness over short head of biceps, pectorals, lat insertion but tissue presentation consistent with delayed onset muscle soreness. No significant limitations in ROM or strength and special tests negative for pathology. Patient tolerates progression to increased loading well in both R wrist eccentrics and L wrist WB. Patient will benefit from physical therapy to increase strength and regain function for return to PLOF.    Personal Factors and Comorbidities  Age;Comorbidity 1    Comorbidities  L  rotator cuff repair with tenodesis  Examination-Participation Restrictions  Driving;Other   Exercise   Stability/Clinical Decision Making  Stable/Uncomplicated    Rehab Potential  Excellent    PT Frequency  2x / week    PT Duration  6 weeks    PT Treatment/Interventions  Moist Heat;Ultrasound;Electrical Stimulation;Cryotherapy;Therapeutic activities;Therapeutic exercise;Patient/family education;Neuromuscular re-education;Manual techniques;Passive range of motion;Dry needling;Taping    PT Next Visit Plan  Advance TE and re-education    PT Home Exercise Plan  resisted sup/pron with band, modified quad wt shift over L wrist with isometric ER/sup    Consulted and Agree with Plan of Care  Patient       Patient will benefit from skilled therapeutic intervention in order to improve the following deficits and impairments:  Decreased range of motion, Increased fascial restricitons, Pain, Decreased endurance, Impaired UE functional use, Hypomobility, Impaired flexibility, Decreased mobility, Decreased strength  Visit Diagnosis: Epicondylitis elbow, medial, right     Problem List Patient Active Problem List   Diagnosis Date Noted  . S/P shoulder surgery 07/09/2019  . Insomnia due to stress 07/09/2019  . Post-menopausal atrophic vaginitis 03/10/2019  . Cough productive of clear sputum 03/10/2019  . Drug-induced neutropenia ([place]) 07/08/2018  . Abnormal weight loss 07/08/2018  . Abnormal blood level of cobalt 06/27/2018  . 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 Melanoma of skin ([place]) 07/27/2015  . History of total right hip arthroplasty 02/09/2015  . Osteopenia 05/25/2014  . 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 History of nephrolithiasis 07/13/2013  . Adverse drug reaction 06/29/2013  . History of adenomatous polyp of colon 06/01/2013  . Hemochromatosis carrier   . Initial Medicare annual wellness visit 05/07/2013  . Hypothyroidism 09/25/2011  . History of idiopathic seizure  09/25/2011  . Screening for cervical cancer 09/25/2011  . Screening for breast cancer 09/25/2011  . Neutropenia ([place])     Virgia Land, SPT 11/04/2019, 2:14 PM  [facility] [address], [place], [address] Phone: [phone]   Fax:  [phone]  Name: Sherra Kimmons MRN: [id number] Date of Birth: 11/23/1951

## 2019-11-10 ENCOUNTER — Ambulatory Visit
Admission: RE | Admit: 2019-11-10 | Discharge: 2019-11-10 | Disposition: A | Discharge status: Home / Self Care | Payer: Medicare Other | Source: Ambulatory Visit | Attending: Internal Medicine | Admitting: Internal Medicine

## 2019-11-10 ENCOUNTER — Other Ambulatory Visit: Payer: Self-pay

## 2019-11-10 DIAGNOSIS — Z1231 Encounter for screening mammogram for malignant neoplasm of breast: Secondary | ICD-10-CM | POA: Diagnosis not present

## 2019-11-16 ENCOUNTER — Ambulatory Visit: Payer: Medicare Other

## 2019-11-16 ENCOUNTER — Other Ambulatory Visit: Payer: Self-pay

## 2019-11-16 DIAGNOSIS — M7701 Medial epicondylitis, right elbow: Secondary | ICD-10-CM | POA: Diagnosis not present

## 2019-11-16 NOTE — Therapy (Signed)
[facility] [address], [place], [address] Phone: [phone]   Fax:  [phone]  Physical Therapy Treatment  Patient Details  Name: Alexis Mcdonald MRN: [id number] Date of Birth: 12-01-1951 Referring Provider (PT): Candelaria Stagers MD   Encounter Date: 11/16/2019  PT End of Session - 11/16/19 0947    Visit Number  7    Number of Visits  13    Date for PT Re-Evaluation  11/30/19    Authorization Type  7 / 10    PT Start Time  0947    PT Stop Time  1030    PT Time Calculation (min)  43 min    Activity Tolerance  Patient tolerated treatment well;No increased pain    Behavior During Therapy  WFL for tasks assessed/performed       Past Medical History:  Diagnosis Date  . Borderline osteopenia    prior DEXA at [facility]   . Cancer ([place])    Melanoma removed from right hip  . Colon polyp 2009   next one due 2012  . 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 History of kidney stones   . Hypothyroidism   . Kidney stones   . Neutropenia    no infections,  bone marrow biopsy by [name] next week  . renal calculi    s/p lithotripsy, [place]  . Thyroid disease    hypothyroidism    Past Surgical History:  Procedure Laterality Date  . COLONOSCOPY  03/23/2010   Diverticulosis  . COLONOSCOPY W/ BIOPSIES  03/14/2006   Tubular adenoma of the rectum x2, upper lesion with focal high-grade dysplasia.  Polyps 9 mm in size.  Marland Kitchen COLONOSCOPY WITH PROPOFOL N/A 10/21/2018   Procedure: COLONOSCOPY WITH PROPOFOL;  Surgeon: Robert Bellow, MD;  Location: [facility]COPY;  Service: Endoscopy;  Laterality: N/A;  . DILATION AND CURETTAGE OF UTERUS    . JOINT REPLACEMENT  2005   done in [place]  . kidney stones removal    . MELANOMA EXCISION  06/29/2016  . right hip replacement    . SHOULDER ARTHROSCOPY WITH OPEN ROTATOR CUFF REPAIR Left 12/01/2018   Procedure: SHOULDER  ARTHROSCOPY WITH OPEN ROTATOR CUFF REPAIR;  Surgeon: Corky Mull, MD;  Location: [facility] ORS;  Service: Orthopedics;  Laterality: Left;    There were no vitals filed for this visit.  Subjective Assessment - 11/16/19 0949    Subjective  Patient reports "turning the corner" says that elbow feels much better. Reports soreness after last session but recovered well. No longer TTP.    Pertinent History  L rotator cuff repair with tenodesis 12/19    Limitations  Lifting;House hold activities    How long can you sit comfortably?  unlimited    How long can you stand comfortably?  unlimited    How long can you walk comfortably?  unlimited    Patient Stated Goals  Return to prior activities    Currently in Pain?  Yes    Pain Score  0-No pain    Pain Onset  More than a month ago    Multiple Pain Sites  Yes    Pain Score  6    Pain Onset  More than a month ago        TREATMENT  Examination AROM L wrist flexion 65 L wrist extension 56 PROM L wrist extension 75  Wrist slides against wall into extension -  patient reports feeling "weak" and uncomfortable along dorsal joint-line at end-range.  MT Soft tissue mobilization and cross friction massage to FCU, FCR, common flexor tendon, FDS. TrP release to flexor mass, FCU ART flexor mass L wrist wall slides for forced extension with manual depression of ulnar head   MT for reduction of pain, promote remodeling of tissue, increased ROM  TE Wrist flexion eccentric/concentric 10# with elbow extended x 20 for increased remodeling of flexor tendon. TG level 15: Pull ups x 10 - note reduced wrist flexor compensation Chin ups x 10 - note reduced wrist flexor compensation L wrist wall slides for increased ROM following mobilization - shows increased range following treatment         PT Education - 11/16/19 0946    Education Details  form/technique with exercise    Person(s) Educated  Patient    Methods  Explanation;Demonstration;Tactile  cues;Verbal cues    Comprehension  Verbalized understanding;Returned demonstration;Verbal cues required;Tactile cues required       PT Short Term Goals - 10/26/19 0958      PT SHORT TERM GOAL #1   Title  Patient will be complaint with HEP as adjunct to therapy to improve recovery and reduce total number of visits.    Baseline  HEP given    Time  1    Period  Weeks    Status  Achieved    Target Date  10/26/19        PT Long Term Goals - 10/19/19 1313      PT LONG TERM GOAL #1   Title  Patient will reduce pain to 1/10 with aggravating activity    Baseline  4/10 with aggravating activity    Time  6    Period  Weeks    Status  New    Target Date  11/30/19      PT LONG TERM GOAL #2   Title  Patient will report no increased symptoms with 30 min driving with standard steering wheel grip for safety with ADLs.    Baseline  Modified grip for driving    Time  6    Period  Weeks    Status  New    Target Date  11/30/19      PT LONG TERM GOAL #3   Title  Patient will demonstrate ability to hold plank with good form for 30 sec with no increase in symptoms.    Baseline  Cannot hold plank due to pain    Time  6    Period  Weeks    Status  New    Target Date  11/30/19              Patient will benefit from skilled therapeutic intervention in order to improve the following deficits and impairments:     Visit Diagnosis: No diagnosis found.     Problem List Patient Active Problem List   Diagnosis Date Noted  . S/P shoulder surgery 07/09/2019  . Insomnia due to stress 07/09/2019  . Post-menopausal atrophic vaginitis 03/10/2019  . Cough productive of clear sputum 03/10/2019  . Drug-induced neutropenia ([place]) 07/08/2018  . Abnormal weight loss 07/08/2018  . Abnormal blood level of cobalt 06/27/2018  . 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 Melanoma of skin ([place]) 07/27/2015  . History of total right hip arthroplasty 02/09/2015  . Osteopenia 05/25/2014  . BCC (basal cell  carcinoma), ear 12/13/2013  . History of nephrolithiasis 07/13/2013  . Adverse drug reaction 06/29/2013  .  History of adenomatous polyp of colon 06/01/2013  . Hemochromatosis carrier   . Initial Medicare annual wellness visit 05/07/2013  . Hypothyroidism 09/25/2011  . History of idiopathic seizure 09/25/2011  . Screening for cervical cancer 09/25/2011  . Screening for breast cancer 09/25/2011  . Neutropenia ([place])     Virgia Land, SPT 11/16/2019, 9:52 AM  [facility] [address], [place], [address] Phone: [phone]   Fax:  [phone]  Name: Alexis Mcdonald MRN: [id number] Date of Birth: [birth year]

## 2019-11-23 ENCOUNTER — Ambulatory Visit: Payer: Medicare Other

## 2019-11-23 ENCOUNTER — Other Ambulatory Visit: Payer: Self-pay

## 2019-11-23 DIAGNOSIS — M7701 Medial epicondylitis, right elbow: Secondary | ICD-10-CM

## 2019-11-23 NOTE — Therapy (Signed)
[facility] [address], [place], [address] Phone: [phone]   Fax:  [phone]  Physical Therapy Treatment  Patient Details  Name: Alexis Mcdonald MRN: [id number] Date of Birth: 1951/12/16 Referring Provider (PT): Candelaria Stagers MD   Encounter Date: 11/23/2019  PT End of Session - 11/23/19 0948    Visit Number  8    Number of Visits  13    Date for PT Re-Evaluation  11/30/19    Authorization Type  8 / 10    PT Start Time  0948    PT Stop Time  1030    PT Time Calculation (min)  42 min    Activity Tolerance  Patient tolerated treatment well;No increased pain    Behavior During Therapy  WFL for tasks assessed/performed       Past Medical History:  Diagnosis Date  . Borderline osteopenia    prior D[facility] at [facility]   . Cancer ([place])    Melanoma removed from right hip  . Colon polyp 2009   next one due 2012  . 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 History of kidney stones   . Hypothyroidism   . Kidney stones   . Neutropenia    no infections,  bone marrow biopsy by [name] next week  . renal calculi    s/p lithotripsy, [place]  . Thyroid disease    hypothyroidism    Past Surgical History:  Procedure Laterality Date  . COLONOSCOPY  03/23/2010   Diverticulosis  . COLONOSCOPY W/ BIOPSIES  03/14/2006   Tubular adenoma of the rectum x2, upper lesion with focal high-grade dysplasia.  Polyps 9 mm in size.  Marland Kitchen COLONOSCOPY WITH PROPOFOL N/A 10/21/2018   Procedure: COLONOSCOPY WITH PROPOFOL;  Surgeon: Robert Bellow, MD;  Location: [facility];  Service: Endoscopy;  Laterality: N/A;  . DILATION AND CURETTAGE OF UTERUS    . JOINT REPLACEMENT  2005   done in [place]  . kidney stones removal    . MELANOMA EXCISION  06/29/2016  . right hip replacement    . SHOULDER ARTHROSCOPY WITH OPEN ROTATOR CUFF REPAIR Left 12/01/2018   Procedure: SHOULDER  ARTHROSCOPY WITH OPEN ROTATOR CUFF REPAIR;  Surgeon: Corky Mull, MD;  Location: [facility] ORS;  Service: Orthopedics;  Laterality: Left;    There were no vitals filed for this visit.  Subjective Assessment - 11/23/19 0949    Subjective  Patient reports "it's still going well" although it got "achier" still overall improvement. Tenderness has become more specific over elbow and less into forearm.    Pertinent History  L rotator cuff repair with tenodesis 12/19    Limitations  Lifting;House hold activities    How long can you sit comfortably?  unlimited    How long can you stand comfortably?  unlimited    How long can you walk comfortably?  unlimited    Patient Stated Goals  Return to prior activities    Currently in Pain?  No/denies    Pain Orientation  Right    Pain Onset  More than a month ago    Pain Score  6    Pain Location  Wrist    Pain Orientation  Left    Pain Onset  More than a month ago       TREATMENT   MT  Manual to R elbow focus on common extensor tendon L wrist distraction x  30 oscillations L wrist mobilizations grade 2-3 radioscaphoid, scaphocapitate for improved extension and reduced pain   TE  R wrist eccentric with elbow extended 20# x 20 with assistance for concentric portion Wall slides L x 30 with concurrent palmar mobilization of ulnar head for improving wrist extension ROM and assessing movement patterning Table slides L x 30 for improving wrist extension ROM and assessing movement patterning Modified quadruped weight shifts pt reports pain in dorsal midline carpal region over capitate, unchanged following manual mobilization Bicep curls 10# x 10 R in standing with cues for avoiding wrist flexor compensation       PT Education - 11/23/19 0947    Education Details  form/technique with exercise    Person(s) Educated  Patient    Methods  Explanation;Demonstration;Tactile cues;Verbal cues    Comprehension  Verbalized understanding;Returned  demonstration;Verbal cues required;Tactile cues required       PT Short Term Goals - 10/26/19 0958      PT SHORT TERM GOAL #1   Title  Patient will be complaint with HEP as adjunct to therapy to improve recovery and reduce total number of visits.    Baseline  HEP given    Time  1    Period  Weeks    Status  Achieved    Target Date  10/26/19        PT Long Term Goals - 10/19/19 1313      PT LONG TERM GOAL #1   Title  Patient will reduce pain to 1/10 with aggravating activity    Baseline  4/10 with aggravating activity    Time  6    Period  Weeks    Status  New    Target Date  11/30/19      PT LONG TERM GOAL #2   Title  Patient will report no increased symptoms with 30 min driving with standard steering wheel grip for safety with ADLs.    Baseline  Modified grip for driving    Time  6    Period  Weeks    Status  New    Target Date  11/30/19      PT LONG TERM GOAL #3   Title  Patient will demonstrate ability to hold plank with good form for 30 sec with no increase in symptoms.    Baseline  Cannot hold plank due to pain    Time  6    Period  Weeks    Status  New    Target Date  11/30/19            Plan - 11/23/19 1053    Clinical Impression Statement  Patient demonstrates progress in R wrist flexors with tenderness restricted to common flexor tendon and normalization of baseline tissue tension in FCU, FCR, FDS muscle bellies. L wrist extension improved with marked reduction of ulnar head deviation but pain remains in forced extension in WB over scaphoid/capitate. Patient will benefit from physical therapy to increase strength and regain function for return to PLOF.    Personal Factors and Comorbidities  Age;Comorbidity 1    Comorbidities  L rotator cuff repair with tenodesis    Examination-Participation Restrictions  Driving;Other   Exercise   Stability/Clinical Decision Making  Stable/Uncomplicated    Rehab Potential  Excellent    PT Frequency  1x / week    PT  Duration  6 weeks    PT Treatment/Interventions  Moist Heat;Ultrasound;Electrical Stimulation;Cryotherapy;Therapeutic activities;Therapeutic exercise;Patient/family education;Neuromuscular re-education;Manual techniques;Passive range of motion;Dry needling;Taping  PT Next Visit Plan  Reevaluate goals; progress CKC therex over R and L    PT Home Exercise Plan  resisted sup/pron with band, bicep curls    Consulted and Agree with Plan of Care  Patient       Patient will benefit from skilled therapeutic intervention in order to improve the following deficits and impairments:  Decreased range of motion, Increased fascial restricitons, Pain, Decreased endurance, Impaired UE functional use, Hypomobility, Impaired flexibility, Decreased mobility, Decreased strength  Visit Diagnosis: Epicondylitis elbow, medial, right     Problem List Patient Active Problem List   Diagnosis Date Noted  . S/P shoulder surgery 07/09/2019  . Insomnia due to stress 07/09/2019  . Post-menopausal atrophic vaginitis 03/10/2019  . Cough productive of clear sputum 03/10/2019  . Drug-induced neutropenia ([place]) 07/08/2018  . Abnormal weight loss 07/08/2018  . Abnormal blood level of cobalt 06/27/2018  . 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 Melanoma of skin ([place]) 07/27/2015  . History of total right hip arthroplasty 02/09/2015  . Osteopenia 05/25/2014  . 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 History of nephrolithiasis 07/13/2013  . Adverse drug reaction 06/29/2013  . History of adenomatous polyp of colon 06/01/2013  . Hemochromatosis carrier   . Initial Medicare annual wellness visit 05/07/2013  . Hypothyroidism 09/25/2011  . History of idiopathic seizure 09/25/2011  . Screening for cervical cancer 09/25/2011  . Screening for breast cancer 09/25/2011  . Neutropenia ([place])     Virgia Land, SPT 11/23/2019, 10:59 AM  [facility] [address], [place], [address] Phone: [phone]   Fax:  [phone]  Name: Alexis Mcdonald MRN: [id number] Date of Birth: [birth year]

## 2019-11-29 ENCOUNTER — Other Ambulatory Visit: Payer: Self-pay | Admitting: Internal Medicine

## 2019-11-30 ENCOUNTER — Ambulatory Visit: Payer: Medicare Other | Attending: Sports Medicine

## 2019-11-30 ENCOUNTER — Other Ambulatory Visit: Payer: Self-pay

## 2019-11-30 DIAGNOSIS — M7701 Medial epicondylitis, right elbow: Secondary | ICD-10-CM | POA: Diagnosis present

## 2019-11-30 NOTE — Therapy (Signed)
[facility] [address] [place], [address] Phone: [phone]   Fax:  [phone]  Physical Therapy Treatment  Patient Details  Name: Alexis Mcdonald MRN: [id number] Date of Birth: 07/17/1951 Referring Provider (PT): Candelaria Stagers MD   Encounter Date: 11/30/2019  PT End of Session - 11/30/19 1242    Visit Number  9    Number of Visits  13    Date for PT Re-Evaluation  11/30/19    Authorization Type  9 / 10    PT Start Time  0947    PT Stop Time  1030    PT Time Calculation (min)  43 min    Activity Tolerance  Patient tolerated treatment well;No increased pain    Behavior During Therapy  WFL for tasks assessed/performed       Past Medical History:  Diagnosis Date  . Borderline osteopenia    prior DEXA at [facility]   . Cancer ([place])    Melanoma removed from right hip  . Colon polyp 2009   next one due 2012  . 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 History of kidney stones   . Hypothyroidism   . Kidney stones   . Neutropenia    no infections,  bone marrow biopsy by [name] next week  . renal calculi    s/p lithotripsy, [place]  . Thyroid disease    hypothyroidism    Past Surgical History:  Procedure Laterality Date  . COLONOSCOPY  03/23/2010   Diverticulosis  . COLONOSCOPY W/ BIOPSIES  03/14/2006   Tubular adenoma of the rectum x2, upper lesion with focal high-grade dysplasia.  Polyps 9 mm in size.  Marland Kitchen COLONOSCOPY WITH PROPOFOL N/A 10/21/2018   Procedure: COLONOSCOPY WITH PROPOFOL;  Surgeon: Robert Bellow, MD;  Location: [facility];  Service: Endoscopy;  Laterality: N/A;  . DILATION AND CURETTAGE OF UTERUS    . JOINT REPLACEMENT  2005   done in [place]  . kidney stones removal    . MELANOMA EXCISION  06/29/2016  . right hip replacement    . SHOULDER ARTHROSCOPY WITH OPEN ROTATOR CUFF REPAIR Left 12/01/2018   Procedure: SHOULDER  ARTHROSCOPY WITH OPEN ROTATOR CUFF REPAIR;  Surgeon: Corky Mull, MD;  Location: [facility] ORS;  Service: Orthopedics;  Laterality: Left;    There were no vitals filed for this visit.  Subjective Assessment - 11/30/19 0948    Subjective  Patient reports soreness in R elbow when pressing on it but not feeling pain with exercise. L wrist feels better but not able to accept full WB.    Pertinent History  L rotator cuff repair with tenodesis 12/19    Limitations  Lifting;House hold activities    How long can you sit comfortably?  unlimited    How long can you stand comfortably?  unlimited    How long can you walk comfortably?  unlimited    Patient Stated Goals  Return to prior activities    Currently in Pain?  No/denies    Pain Onset  More than a month ago    Multiple Pain Sites  No    Pain Onset  More than a month ago         TREATMENT  MT Soft tissue mobilization and cross friction massage to R elbow focus on common extensor tendon Instruction in tennis ball massage for flexor mass  MT to promote remodeling of abnormal  tendon tissue  TE R wrist eccentric with elbow extended 20# 2 x 15 with assistance for concentric portion Wall slides L wrist x 10 for improving wrist extension ROM and assessing movement patterning Table slides L wrist x 20 for improving wrist extension ROM and assessing movement patterning L wrist WB in modified quadruped against wall trunk rotation x 10 to improve dynamic stability through L wrist TG level 15 pull ups/chin ups x 10 each - note early scapular rise with upper trap compensation Lat pull downs #25 3 x 10 - instruction for scapular initiation  Provided education on gym program and activity tolerance   TE for remodeling/strengthening of tendon and UE strength for return to PLOF       PT Education - 11/30/19 0947    Education Details  form/technique with exercise    Person(s) Educated  Patient    Methods  Explanation;Demonstration;Tactile  cues;Verbal cues    Comprehension  Verbalized understanding;Returned demonstration;Verbal cues required;Tactile cues required       PT Short Term Goals - 10/26/19 0958      PT SHORT TERM GOAL #1   Title  Patient will be complaint with HEP as adjunct to therapy to improve recovery and reduce total number of visits.    Baseline  HEP given    Time  1    Period  Weeks    Status  Achieved    Target Date  10/26/19        PT Long Term Goals - 10/19/19 1313      PT LONG TERM GOAL #1   Title  Patient will reduce pain to 1/10 with aggravating activity    Baseline  4/10 with aggravating activity    Time  6    Period  Weeks    Status  New    Target Date  11/30/19      PT LONG TERM GOAL #2   Title  Patient will report no increased symptoms with 30 min driving with standard steering wheel grip for safety with ADLs.    Baseline  Modified grip for driving    Time  6    Period  Weeks    Status  New    Target Date  11/30/19      PT LONG TERM GOAL #3   Title  Patient will demonstrate ability to hold plank with good form for 30 sec with no increase in symptoms.    Baseline  Cannot hold plank due to pain    Time  6    Period  Weeks    Status  New    Target Date  11/30/19            Plan - 11/30/19 1243    Clinical Impression Statement  R epicondylalgia resolving with mild tenderness remaining in common flexor tendon. L wrist extension improved and patient able to tolerate WB on wall. Pt able to tolerate pull ups/chin ups well and demonstrates good understanding of scapular initiation. Patient will benefit from physical therapy to increase strength and regain function for return to PLOF.    Personal Factors and Comorbidities  Age;Comorbidity 1    Comorbidities  L rotator cuff repair with tenodesis    Examination-Participation Restrictions  Driving;Other   Exercise   Stability/Clinical Decision Making  Stable/Uncomplicated    Rehab Potential  Excellent    PT Frequency  1x / week     PT Duration  6 weeks    PT Treatment/Interventions  Moist Heat;Ultrasound;Dealer  Stimulation;Cryotherapy;Therapeutic activities;Therapeutic exercise;Patient/family education;Neuromuscular re-education;Manual techniques;Passive range of motion;Dry needling;Taping    PT Next Visit Plan  Reevaluate goals; progress CKC therex over R and L    PT Home Exercise Plan  resisted sup/pron with band, bicep curls    Consulted and Agree with Plan of Care  Patient       Patient will benefit from skilled therapeutic intervention in order to improve the following deficits and impairments:  Decreased range of motion, Increased fascial restricitons, Pain, Decreased endurance, Impaired UE functional use, Hypomobility, Impaired flexibility, Decreased mobility, Decreased strength  Visit Diagnosis: Epicondylitis elbow, medial, right     Problem List Patient Active Problem List   Diagnosis Date Noted  . S/P shoulder surgery 07/09/2019  . Insomnia due to stress 07/09/2019  . Post-menopausal atrophic vaginitis 03/10/2019  . Cough productive of clear sputum 03/10/2019  . Drug-induced neutropenia ([place]) 07/08/2018  . Abnormal weight loss 07/08/2018  . Abnormal blood level of cobalt 06/27/2018  . 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 Melanoma of skin ([place]) 07/27/2015  . History of total right hip arthroplasty 02/09/2015  . Osteopenia 05/25/2014  . 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 History of nephrolithiasis 07/13/2013  . Adverse drug reaction 06/29/2013  . History of adenomatous polyp of colon 06/01/2013  . Hemochromatosis carrier   . Initial Medicare annual wellness visit 05/07/2013  . Hypothyroidism 09/25/2011  . History of idiopathic seizure 09/25/2011  . Screening for cervical cancer 09/25/2011  . Screening for breast cancer 09/25/2011  . Neutropenia ([place])     Virgia Land, SPT 11/30/2019, 12:52 PM  [facility] [address], [place], [address] Phone: [phone]   Fax:  [phone]  Name: Alexis Mcdonald MRN: [id number] Date of Birth: 14-Jan-1951

## 2019-12-07 ENCOUNTER — Other Ambulatory Visit: Payer: Self-pay

## 2019-12-07 ENCOUNTER — Ambulatory Visit: Payer: Medicare Other

## 2019-12-07 DIAGNOSIS — M7701 Medial epicondylitis, right elbow: Secondary | ICD-10-CM | POA: Diagnosis not present

## 2019-12-07 NOTE — Therapy (Signed)
[facility] [address] [place], [address] Phone: [phone]   Fax:  [phone]  Physical Therapy Treatment  Patient Details  Name: Alexis Mcdonald MRN: [id number] Date of Birth: 1951-10-02 Referring Provider (PT): Candelaria Stagers MD   Encounter Date: 12/07/2019  PT End of Session - 12/07/19 0903    Visit Number  10    Number of Visits  13    Date for PT Re-Evaluation  11/30/19    Authorization Type  10 / 10    PT Start Time  0901    PT Stop Time  0939    PT Time Calculation (min)  38 min    Activity Tolerance  Patient tolerated treatment well;No increased pain    Behavior During Therapy  WFL for tasks assessed/performed       Past Medical History:  Diagnosis Date  . Borderline osteopenia    prior DEXA at [facility]   . Cancer ([place])    Melanoma removed from right hip  . Colon polyp 2009   next one due 2012  . 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 History of kidney stones   . Hypothyroidism   . Kidney stones   . Neutropenia    no infections,  bone marrow biopsy by [name] next week  . renal calculi    s/p lithotripsy, [place]  . Thyroid disease    hypothyroidism    Past Surgical History:  Procedure Laterality Date  . COLONOSCOPY  03/23/2010   Diverticulosis  . COLONOSCOPY W/ BIOPSIES  03/14/2006   Tubular adenoma of the rectum x2, upper lesion with focal high-grade dysplasia.  Polyps 9 mm in size.  Marland Kitchen COLONOSCOPY WITH PROPOFOL N/A 10/21/2018   Procedure: COLONOSCOPY WITH PROPOFOL;  Surgeon: Robert Bellow, MD;  Location: [facility];  Service: Endoscopy;  Laterality: N/A;  . DILATION AND CURETTAGE OF UTERUS    . JOINT REPLACEMENT  2005   done in [place]  . kidney stones removal    . MELANOMA EXCISION  06/29/2016  . right hip replacement    . SHOULDER ARTHROSCOPY WITH OPEN ROTATOR CUFF REPAIR Left 12/01/2018   Procedure: SHOULDER  ARTHROSCOPY WITH OPEN ROTATOR CUFF REPAIR;  Surgeon: Corky Mull, MD;  Location: [facility] ORS;  Service: Orthopedics;  Laterality: Left;    There were no vitals filed for this visit.  Subjective Assessment - 12/07/19 0903    Subjective  Patient reports mild soreness in R elbow following exercise but not during. L wrist not much changed since last session.    Pertinent History  L rotator cuff repair with tenodesis 12/19    Limitations  Lifting;House hold activities    How long can you sit comfortably?  unlimited    How long can you stand comfortably?  unlimited    How long can you walk comfortably?  unlimited    Patient Stated Goals  Return to prior activities    Currently in Pain?  No/denies    Pain Onset  More than a month ago    Pain Onset  More than a month ago        TREATMENT  Soft tissue mobilization, ischemic compression, and cross friction massage along R wrist flexors with focus on FCR and common flexor tendon x 15 min Lateral mobilization of L wrist proximal carpals on radius grade 2 x 30 Radioscaphoid mobilization L wrist dorsal-volar grade 3 x 30 Radioscaphoid  MWM L wrist per Mulligan x 30  MT for pain relief, to promote remodeling of tissue, and to increase ROM in L wrist WB   TE Wrist flexor stretch 2 c 30 sec Wrist flexion AROM 5# 2 x 15 L modified quadruped weight transfers onto wrist 2 x 10 - patient able to perform pain-free R/L Bodyblade for wrist stab in varying ranges of horizontal abd/add for wrist and shoulder stability x 3 min ea L wrist extension against manual resistance concentric/eccentric x 10   TE for strengthening, stabilization, and remodeling of tissue  Advised pt re: yoga poses for appropriate loading through joints and to avoid full WB    PT Education - 12/07/19 0953    Education Details  form/technique with exercise; appropriate activity modification with yoga    Person(s) Educated  Patient    Methods  Explanation;Demonstration;Tactile  cues;Verbal cues    Comprehension  Verbalized understanding;Returned demonstration;Verbal cues required;Tactile cues required       PT Short Term Goals - 10/26/19 0958      PT SHORT TERM GOAL #1   Title  Patient will be complaint with HEP as adjunct to therapy to improve recovery and reduce total number of visits.    Baseline  HEP given    Time  1    Period  Weeks    Status  Achieved    Target Date  10/26/19        PT Long Term Goals - 12/07/19 0904      PT LONG TERM GOAL #1   Title  Patient will reduce pain to 1/10 with aggravating activity    Baseline  4/10 with aggravating activity; 12/07/2019 0/10 but limited to under 10#    Time  6    Period  Weeks    Status  Partially Met      PT LONG TERM GOAL #2   Title  Patient will report no increased symptoms with 30 min driving with standard steering wheel grip for safety with ADLs.    Baseline  Modified grip for driving; 38/12/7709 pt reports still using modified grip but no longer necessary    Time  6    Period  Weeks    Status  Achieved      PT LONG TERM GOAL #3   Title  Patient will demonstrate ability to hold plank with good form for 30 sec with no increase in symptoms.    Baseline  Cannot hold plank due to pain; 12/07/2019 can hold modified quadruped but not plank    Time  6    Period  Weeks    Status  On-going            Plan - 12/07/19 0947    Clinical Impression Statement  Note mild exacerbation with R wrist epicondylalgia with multiple trigger points and TTP in wrist flexors, likely related to increased load with exercise; treatment intensity reduced accordingly. R wrist remains painful with 10# load which represents a deficit compared to PLOF. L wrist able to tolerate increased WB in full ROM with no pain following MWM. Patient has markedly improved since evaluation however mild deficits remain. Plan to continue to increase WB over L wrist with mobilizations as needed in preparation for d/c to gym program. Patient  will benefit from physical therapy to increase strength and regain function for return to PLOF.    Personal Factors and Comorbidities  Age;Comorbidity 1    Comorbidities  L rotator cuff repair with tenodesis  Examination-Participation Restrictions  Driving;Other   Exercise   Stability/Clinical Decision Making  Stable/Uncomplicated    Rehab Potential  Excellent    PT Frequency  1x / week    PT Duration  6 weeks    PT Treatment/Interventions  Moist Heat;Ultrasound;Electrical Stimulation;Cryotherapy;Therapeutic activities;Therapeutic exercise;Patient/family education;Neuromuscular re-education;Manual techniques;Passive range of motion;Dry needling;Taping    PT Next Visit Plan  Reevaluate goals; progress CKC therex over R and L    PT Home Exercise Plan  resisted sup/pron with band, bicep curls, mod quad weight shifts    Consulted and Agree with Plan of Care  Patient       Patient will benefit from skilled therapeutic intervention in order to improve the following deficits and impairments:  Decreased range of motion, Increased fascial restricitons, Pain, Decreased endurance, Impaired UE functional use, Hypomobility, Impaired flexibility, Decreased mobility, Decreased strength  Visit Diagnosis: Epicondylitis elbow, medial, right     Problem List Patient Active Problem List   Diagnosis Date Noted  . S/P shoulder surgery 07/09/2019  . Insomnia due to stress 07/09/2019  . Post-menopausal atrophic vaginitis 03/10/2019  . Cough productive of clear sputum 03/10/2019  . Drug-induced neutropenia ([place]) 07/08/2018  . Abnormal weight loss 07/08/2018  . Abnormal blood level of cobalt 06/27/2018  . 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 Melanoma of skin ([place]) 07/27/2015  . History of total right hip arthroplasty 02/09/2015  . Osteopenia 05/25/2014  . 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 History of nephrolithiasis 07/13/2013  . Adverse drug reaction 06/29/2013  . History of adenomatous  polyp of colon 06/01/2013  . Hemochromatosis carrier   . Initial Medicare annual wellness visit 05/07/2013  . Hypothyroidism 09/25/2011  . History of idiopathic seizure 09/25/2011  . Screening for cervical cancer 09/25/2011  . Screening for breast cancer 09/25/2011  . Neutropenia ([place])     Virgia Land, SPT 12/07/2019, 9:55 AM  [facility] [address], [place], [address] Phone: [phone]   Fax:  [phone]  Name: Alexis Mcdonald MRN: [id number] Date of Birth: October 16, 1951

## 2019-12-14 ENCOUNTER — Other Ambulatory Visit: Payer: Self-pay

## 2019-12-14 ENCOUNTER — Ambulatory Visit: Payer: Medicare Other

## 2019-12-14 DIAGNOSIS — M7701 Medial epicondylitis, right elbow: Secondary | ICD-10-CM | POA: Diagnosis not present

## 2019-12-14 NOTE — Therapy (Signed)
[facility] [address] [place], [address] Phone: [phone]   Fax:  [phone]  Physical Therapy Treatment  Patient Details  Name: Alexis Mcdonald MRN: [id number] Date of Birth: [birth year] Referring Provider (PT): Candelaria Stagers MD   Encounter Date: 12/14/2019  PT End of Session - 12/14/19 0859    Visit Number  11    Number of Visits  13    Date for PT Re-Evaluation  11/30/19    Authorization Type  1 / 10    PT Start Time  0901    PT Stop Time  0942    PT Time Calculation (min)  41 min    Activity Tolerance  Patient tolerated treatment well;No increased pain    Behavior During Therapy  WFL for tasks assessed/performed       Past Medical History:  Diagnosis Date  . Borderline osteopenia    [facility]   . Cancer ([place])    Melanoma removed from right hip  . Colon polyp 2009   next one due 2012  . 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 History of kidney stones   . Hypothyroidism   . Kidney stones   . Neutropenia    no infections,  bone marrow biopsy by [name] next week  . renal calculi    s/p lithotripsy, [place]  . Thyroid disease    hypothyroidism    Past Surgical History:  Procedure Laterality Date  . COLONOSCOPY  03/23/2010   Diverticulosis  . COLONOSCOPY W/ BIOPSIES  03/14/2006   Tubular adenoma of the rectum x2, upper lesion with focal high-grade dysplasia.  Polyps 9 mm in size.  Marland Kitchen COLONOSCOPY WITH PROPOFOL N/A 10/21/2018   Procedure: COLONOSCOPY WITH PROPOFOL;  Surgeon: Robert Bellow, MD;  Location: [facility];  Service: Endoscopy;  Laterality: N/A;  . DILATION AND CURETTAGE OF UTERUS    . JOINT REPLACEMENT  2005   done in [place]  . kidney stones removal    . MELANOMA EXCISION  06/29/2016  . right hip replacement    . SHOULDER ARTHROSCOPY WITH OPEN ROTATOR CUFF REPAIR Left 12/01/2018   Procedure: SHOULDER  ARTHROSCOPY WITH OPEN ROTATOR CUFF REPAIR;  Surgeon: Corky Mull, MD;  Location: [facility];  Service: Orthopedics;  Laterality: Left;    There were no vitals filed for this visit.  Subjective Assessment - 12/14/19 0902    Subjective  Patient reports she did less activity over weekend. Reports that L wrist pain with WB reduced with digital flexion    Pertinent History  L rotator cuff repair with tenodesis 12/19    Limitations  Lifting;House hold activities    How long can you sit comfortably?  unlimited    How long can you stand comfortably?  unlimited    How long can you walk comfortably?  unlimited    Patient Stated Goals  Return to prior activities    Currently in Pain?  No/denies    Pain Onset  More than a month ago    Pain Onset  More than a month ago       TREATMENT  Soft tissue mobilization, graston and cross friction massage along R wrist flexors with focus on FCR and common flexor tendon 2 x 10 min Radioscaphoid MWM L wrist per Mulligan x 20 [facility]MC mobilization digits 2-5 volar/dorsal grade 3 x 20  MT for pain relief, to promote remodeling  of tissue, and to increase ROM in L wrist WB. Following treatment pt reports able to full WB without pain or discomfort.   TE Wrist flexor stretch 3 x 30 sec Wrist flexion eccentric/concentric 20# 1 x 10, 1 x 8 with contralateral assist as needed with fatigue L modified quadruped weight transfers onto wrist 3 x 10 - patient able to perform pain-free   TE for strengthening, stabilization, and remodeling of tissue       PT Education - 12/14/19 0858    Education Details  form/technique with exercise    Person(s) Educated  Patient    Methods  Explanation;Demonstration;Tactile cues;Verbal cues    Comprehension  Verbalized understanding;Returned demonstration;Verbal cues required;Tactile cues required       PT Short Term Goals - 10/26/19 0958      PT SHORT TERM GOAL #1   Title  Patient will be complaint with HEP as adjunct to  therapy to improve recovery and reduce total number of visits.    Baseline  HEP given    Time  1    Period  Weeks    Status  Achieved    Target Date  10/26/19        PT Long Term Goals - 12/07/19 0904      PT LONG TERM GOAL #1   Title  Patient will reduce pain to 1/10 with aggravating activity    Baseline  4/10 with aggravating activity; 12/07/2019 0/10 but limited to under 10#    Time  6    Period  Weeks    Status  Partially Met      PT LONG TERM GOAL #2   Title  Patient will report no increased symptoms with 30 min driving with standard steering wheel grip for safety with ADLs.    Baseline  Modified grip for driving; 60/12/930 pt reports still using modified grip but no longer necessary    Time  6    Period  Weeks    Status  Achieved      PT LONG TERM GOAL #3   Title  Patient will demonstrate ability to hold plank with good form for 30 sec with no increase in symptoms.    Baseline  Cannot hold plank due to pain; 12/07/2019 can hold modified quadruped but not plank    Time  6    Period  Weeks    Status  On-going            Plan - 12/14/19 0954    Clinical Impression Statement  Patient tolerated increased exercise intensity well. Tissue remodeling in R wrist flexors continues with remaining pain primarily in FCR and common flexor tendon under load. Following mobilization pt demonstrates reduced pain with WB over L wrist and demonstrates some carryover from last session. Patient will benefit from physical therapy to increase strength and regain function for return to PLOF.    Personal Factors and Comorbidities  Age;Comorbidity 1    Comorbidities  L rotator cuff repair with tenodesis    Examination-Participation Restrictions  Driving;Other   Exercise   Stability/Clinical Decision Making  Stable/Uncomplicated    Rehab Potential  Excellent    PT Frequency  1x / week    PT Duration  6 weeks    PT Treatment/Interventions  Moist Heat;Ultrasound;Electrical  Stimulation;Cryotherapy;Therapeutic activities;Therapeutic exercise;Patient/family education;Neuromuscular re-education;Manual techniques;Passive range of motion;Dry needling;Taping    PT Next Visit Plan  Reevaluate goals; progress CKC therex over R and L    PT Home Exercise Plan  resisted sup/pron with band, bicep curls, mod quad weight shifts    Consulted and Agree with Plan of Care  Patient       Patient will benefit from skilled therapeutic intervention in order to improve the following deficits and impairments:  Decreased range of motion, Increased fascial restricitons, Pain, Decreased endurance, Impaired UE functional use, Hypomobility, Impaired flexibility, Decreased mobility, Decreased strength  Visit Diagnosis: Epicondylitis elbow, medial, right     Problem List Patient Active Problem List   Diagnosis Date Noted  . S/P shoulder surgery 07/09/2019  . Insomnia due to stress 07/09/2019  . Post-menopausal atrophic vaginitis 03/10/2019  . Cough productive of clear sputum 03/10/2019  . Drug-induced neutropenia ([place]) 07/08/2018  . Abnormal weight loss 07/08/2018  . Abnormal blood level of cobalt 06/27/2018  . 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 Melanoma of skin ([place]) 07/27/2015  . History of total right hip arthroplasty 02/09/2015  . Osteopenia 05/25/2014  . 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 History of nephrolithiasis 07/13/2013  . Adverse drug reaction 06/29/2013  . History of adenomatous polyp of colon 06/01/2013  . Hemochromatosis carrier   . Initial Medicare annual wellness visit 05/07/2013  . Hypothyroidism 09/25/2011  . History of idiopathic seizure 09/25/2011  . Screening for cervical cancer 09/25/2011  . Screening for breast cancer 09/25/2011  . Neutropenia ([place])     Virgia Land, SPT 12/14/2019, 10:04 AM  [facility] [address], [place], [address] Phone: [phone]   Fax:   [phone]  Name: Eternity Dexter MRN: [id number] Date of Birth: [birth year]

## 2019-12-21 ENCOUNTER — Ambulatory Visit: Payer: Medicare Other

## 2019-12-21 ENCOUNTER — Other Ambulatory Visit: Payer: Self-pay

## 2019-12-21 DIAGNOSIS — M7701 Medial epicondylitis, right elbow: Secondary | ICD-10-CM | POA: Diagnosis not present

## 2019-12-21 NOTE — Therapy (Signed)
[facility] [address], [place], [address] Phone: [phone]   Fax:  [phone]  Physical Therapy Treatment  Patient Details  Name: Alexis Mcdonald MRN: [id number] Date of Birth: Nov 22, 1951 Referring Provider (PT): Candelaria Stagers MD   Encounter Date: 12/21/2019  PT End of Session - 12/21/19 0858    Visit Number  12    Number of Visits  21    Date for PT Re-Evaluation  01/04/20    Authorization Type  2 / 10    PT Start Time  0900    PT Stop Time  0945    PT Time Calculation (min)  45 min    Activity Tolerance  Patient tolerated treatment well;No increased pain    Behavior During Therapy  WFL for tasks assessed/performed       Past Medical History:  Diagnosis Date  . Borderline osteopenia    prior DEXA at [facility]   . Cancer ([place])    Melanoma removed from right hip  . Colon polyp 2009   next one due 2012  . 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 History of kidney stones   . Hypothyroidism   . Kidney stones   . Neutropenia    no infections,  bone marrow biopsy by [name] next week  . renal calculi    s/p lithotripsy, [place]  . Thyroid disease    hypothyroidism    Past Surgical History:  Procedure Laterality Date  . COLONOSCOPY  03/23/2010   Diverticulosis  . COLONOSCOPY W/ BIOPSIES  03/14/2006   Tubular adenoma of the rectum x2, upper lesion with focal high-grade dysplasia.  Polyps 9 mm in size.  Marland Kitchen COLONOSCOPY WITH PROPOFOL N/A 10/21/2018   Procedure: COLONOSCOPY WITH PROPOFOL;  Surgeon: Robert Bellow, MD;  Location: [facility] ENDOSCOPY;  Service: Endoscopy;  Laterality: N/A;  . DILATION AND CURETTAGE OF UTERUS    . JOINT REPLACEMENT  2005   done in [place]  . kidney stones removal    . MELANOMA EXCISION  06/29/2016  . right hip replacement    . SHOULDER ARTHROSCOPY WITH OPEN ROTATOR CUFF REPAIR Left 12/01/2018   Procedure: SHOULDER  ARTHROSCOPY WITH OPEN ROTATOR CUFF REPAIR;  Surgeon: Corky Mull, MD;  Location: [facility] ORS;  Service: Orthopedics;  Laterality: Left;    There were no vitals filed for this visit.  Subjective Assessment - 12/21/19 0901    Subjective  Patient reports soreness in R elbow and overdoing it with planks on Saturday/Sunday and decorating with icing on Friday.    Pertinent History  L rotator cuff repair with tenodesis 12/19    Limitations  Lifting;House hold activities    How long can you sit comfortably?  unlimited    How long can you stand comfortably?  unlimited    How long can you walk comfortably?  unlimited    Patient Stated Goals  Return to prior activities    Currently in Pain?  No/denies    Pain Onset  More than a month ago    Pain Onset  More than a month ago       TREATMENT   MT Soft tissue mobilization, myofascial release, and cross friction massage along R wrist flexors, biceps insertion with focus on FCR and common flexor tendon 2 x 10 min Radioscaphoid MWM L wrist per Mulligan x 10  CMC mobilization digits 2-3 volar/dorsal grade 4 x 20 2nd ray  mobilization volar/dorsal  Mobilization distal/proximal row of carpals volar/dorsal grade 4 x 20  MT for pain relief, to promote remodeling of tissue, and to increase ROM in L wrist WB. Following treatment pt reports able to WB without reduced pain   TE Wrist flexor stretch 3 x 30 sec Wrist flexion eccentric/concentric 10# 2 x 12 L modified quadruped weight transfers onto wrist 2 x 10 - patient able to perform pain-free L quadruped weight transfers onto wrist 1 x 5 - pt reports pain along 2nd ray  TE for strengthening, stabilization, and remodeling of tissue      PT Education - 12/21/19 0858    Education Details  form/technique with exercise    Person(s) Educated  Patient    Methods  Explanation;Demonstration;Verbal cues;Tactile cues    Comprehension  Verbalized understanding;Returned demonstration;Verbal cues  required;Tactile cues required       PT Short Term Goals - 10/26/19 0958      PT SHORT TERM GOAL #1   Title  Patient will be complaint with HEP as adjunct to therapy to improve recovery and reduce total number of visits.    Baseline  HEP given    Time  1    Period  Weeks    Status  Achieved    Target Date  10/26/19        PT Long Term Goals - 12/07/19 0904      PT LONG TERM GOAL #1   Title  Patient will reduce pain to 1/10 with aggravating activity    Baseline  4/10 with aggravating activity; 12/07/2019 0/10 but limited to under 10#    Time  6    Period  Weeks    Status  Partially Met      PT LONG TERM GOAL #2   Title  Patient will report no increased symptoms with 30 min driving with standard steering wheel grip for safety with ADLs.    Baseline  Modified grip for driving; 00/06/1218 pt reports still using modified grip but no longer necessary    Time  6    Period  Weeks    Status  Achieved      PT LONG TERM GOAL #3   Title  Patient will demonstrate ability to hold plank with good form for 30 sec with no increase in symptoms.    Baseline  Cannot hold plank due to pain; 12/07/2019 can hold modified quadruped but not plank    Time  6    Period  Weeks    Status  On-going            Plan - 12/21/19 1805    Clinical Impression Statement  Patient presents with mild increase in common flexor tendon irritation secondary to increased activity with holiday planning, improved with manual therapy. Demonstrates good ability to WB through L wrist following mobilizations but mild pain remains through 2nd ray. Educated pt regarding modifications to yoga to promote carryover. Patient continues to make good progress towards goals and will benefit from physical therapy to return to PLOF.    Personal Factors and Comorbidities  Age;Comorbidity 1    Comorbidities  L rotator cuff repair with tenodesis    Examination-Participation Restrictions  Driving;Other   Exercise   Stability/Clinical  Decision Making  Stable/Uncomplicated    Rehab Potential  Excellent    PT Frequency  1x / week    PT Duration  6 weeks    PT Treatment/Interventions  Moist Heat;Ultrasound;Electrical Stimulation;Cryotherapy;Therapeutic activities;Therapeutic exercise;Patient/family education;Neuromuscular re-education;Manual techniques;Passive  range of motion;Dry needling;Taping    PT Next Visit Plan  Reevaluate goals; progress CKC therex over R and L    PT Home Exercise Plan  resisted sup/pron with band, bicep curls, mod quad weight shifts    Consulted and Agree with Plan of Care  Patient       Patient will benefit from skilled therapeutic intervention in order to improve the following deficits and impairments:  Decreased range of motion, Increased fascial restricitons, Pain, Decreased endurance, Impaired UE functional use, Hypomobility, Impaired flexibility, Decreased mobility, Decreased strength  Visit Diagnosis: Epicondylitis elbow, medial, right     Problem List Patient Active Problem List   Diagnosis Date Noted  . S/P shoulder surgery 07/09/2019  . Insomnia due to stress 07/09/2019  . Post-menopausal atrophic vaginitis 03/10/2019  . Cough productive of clear sputum 03/10/2019  . Drug-induced neutropenia ([place]) 07/08/2018  . Abnormal weight loss 07/08/2018  . Abnormal blood level of cobalt 06/27/2018  . 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 Melanoma of skin ([place]) 07/27/2015  . History of total right hip arthroplasty 02/09/2015  . Osteopenia 05/25/2014  . 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 History of nephrolithiasis 07/13/2013  . Adverse drug reaction 06/29/2013  . History of adenomatous polyp of colon 06/01/2013  . Hemochromatosis carrier   . Initial Medicare annual wellness visit 05/07/2013  . Hypothyroidism 09/25/2011  . History of idiopathic seizure 09/25/2011  . Screening for cervical cancer 09/25/2011  . Screening for breast cancer 09/25/2011  . Neutropenia ([place])      Virgia Land, PT DPT 12/21/2019, 6:10 PM  [facility] [address] [place], [address] Phone: [phone]   Fax:  [phone]  Name: Tatumn Corbridge MRN: [id number] Date of Birth: April 18, 1951

## 2019-12-21 NOTE — Addendum Note (Signed)
Addended by: Blain Pais on: 12/21/2019 09:04 AM   Modules accepted: Orders

## 2019-12-28 ENCOUNTER — Other Ambulatory Visit: Payer: Self-pay

## 2019-12-28 ENCOUNTER — Ambulatory Visit: Payer: Medicare Other

## 2019-12-28 DIAGNOSIS — M7701 Medial epicondylitis, right elbow: Secondary | ICD-10-CM

## 2019-12-28 NOTE — Therapy (Signed)
[facility] [address] [place], [address] Phone: [phone]   Fax:  [phone]  Physical Therapy Treatment  Patient Details  Name: Alexis Mcdonald MRN: [id number] Date of Birth: [birth year] Referring Provider (PT): Candelaria Stagers MD   Encounter Date: 12/28/2019  PT End of Session - 12/28/19 0900    Visit Number  13    Number of Visits  21    Date for PT Re-Evaluation  01/04/20    Authorization Type  3 / 10    PT Start Time  0900    PT Stop Time  0933    PT Time Calculation (min)  33 min    Activity Tolerance  Patient tolerated treatment well;No increased pain    Behavior During Therapy  WFL for tasks assessed/performed       Past Medical History:  Diagnosis Date  . Borderline osteopenia    prior D[facility]A at [facility]   . Cancer ([place])    Melanoma removed from right hip  . Colon polyp 2009   next one due 2012  . 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 History of kidney stones   . Hypothyroidism   . Kidney stones   . Neutropenia    no infections,  bone marrow biopsy by [name] next week  . renal calculi    s/p lithotripsy, [place]  . Thyroid disease    hypothyroidism    Past Surgical History:  Procedure Laterality Date  . COLONOSCOPY  03/23/2010   Diverticulosis  . COLONOSCOPY W/ BIOPSIES  03/14/2006   Tubular adenoma of the rectum x2, upper lesion with focal high-grade dysplasia.  Polyps 9 mm in size.  Marland Kitchen COLONOSCOPY WITH PROPOFOL N/A 10/21/2018   Procedure: COLONOSCOPY WITH PROPOFOL;  Surgeon: Robert Bellow, MD;  Location: [facility];  Service: Endoscopy;  Laterality: N/A;  . DILATION AND CURETTAGE OF UTERUS    . JOINT REPLACEMENT  2005   done in [place]  . kidney stones removal    . MELANOMA EXCISION  06/29/2016  . right hip replacement    . SHOULDER ARTHROSCOPY WITH OPEN ROTATOR CUFF REPAIR Left 12/01/2018   Procedure: SHOULDER  ARTHROSCOPY WITH OPEN ROTATOR CUFF REPAIR;  Surgeon: Corky Mull, MD;  Location: [facility] ORS;  Service: Orthopedics;  Laterality: Left;    There were no vitals filed for this visit.  Subjective Assessment - 12/28/19 0902    Subjective  Patient reports that R elbow is feeling good. Performed several exercises without pain. L wrist is far less painful than previously.    Pertinent History  L rotator cuff repair with tenodesis 12/19    Limitations  Lifting;House hold activities    How long can you sit comfortably?  unlimited    How long can you stand comfortably?  unlimited    How long can you walk comfortably?  unlimited    Patient Stated Goals  Return to prior activities    Currently in Pain?  No/denies    Pain Onset  More than a month ago    Pain Onset  More than a month ago      TREATMENT  5/10 L wrist pain with down dog  MT Volar/dorsal mobilization for trapezoid-trapezium jt, trapezoid-2nd MT grade 4 x 20, trapezoid-scaphoid joint grade 4 x 40  Pain with down dog reduced following mobilization   TE L wrist self-distraction 3 sec hold x 10 "Dog leash" TRX  distal carpal self-mobilization x 20 Weight shifts from child's pose to quad x 10, with wide hand placement x 10 - note pain abolished in this position - with narrow hand placement x 10 UE marches from quad x 10   Discussed POC with pt. Note that significant progress towards goals has been achieved and pt may be able to continue to make gains independent of therapy with HEP. Will f/u in 2 weeks to determine need for additional therapy.    PT Education - 12/28/19 [phone]    Education Details  form/technique with exercise    Person(s) Educated  Patient    Methods  Explanation;Demonstration;Verbal cues    Comprehension  Verbalized understanding;Returned demonstration;Verbal cues required       PT Short Term Goals - 10/26/19 0958      PT SHORT TERM GOAL #1   Title  Patient will be complaint with HEP as adjunct to therapy  to improve recovery and reduce total number of visits.    Baseline  HEP given    Time  1    Period  Weeks    Status  Achieved    Target Date  10/26/19        PT Long Term Goals - 12/07/19 0904      PT LONG TERM GOAL #1   Title  Patient will reduce pain to 1/10 with aggravating activity    Baseline  4/10 with aggravating activity; 12/07/2019 0/10 but limited to under 10#    Time  6    Period  Weeks    Status  Partially Met      PT LONG TERM GOAL #2   Title  Patient will report no increased symptoms with 30 min driving with standard steering wheel grip for safety with ADLs.    Baseline  Modified grip for driving; 46/05/5992 pt reports still using modified grip but no longer necessary    Time  6    Period  Weeks    Status  Achieved      PT LONG TERM GOAL #3   Title  Patient will demonstrate ability to hold plank with good form for 30 sec with no increase in symptoms.    Baseline  Cannot hold plank due to pain; 12/07/2019 can hold modified quadruped but not plank    Time  6    Period  Weeks    Status  On-going            Plan - 12/28/19 5701    Clinical Impression Statement  Patient demonstrates carryover with WB tolerance through L wrist and reduction in R elbow pain. Tolerance for WB further improved with mobilizations and oscillatory WB therex. Provided education on HEP for carpal mobilization and patient expressed good comprehension. Patient is on excellent trajectory to meet all goals. Plan to f/u in two weeks.    Personal Factors and Comorbidities  Age;Comorbidity 1    Comorbidities  L rotator cuff repair with tenodesis    Examination-Participation Restrictions  Driving;Other   Exercise   Stability/Clinical Decision Making  Stable/Uncomplicated    Rehab Potential  Excellent    PT Frequency  Biweekly    PT Duration  6 weeks    PT Treatment/Interventions  Moist Heat;Ultrasound;Electrical Stimulation;Cryotherapy;Therapeutic activities;Therapeutic exercise;Patient/family  education;Neuromuscular re-education;Manual techniques;Passive range of motion;Dry needling;Taping    PT Next Visit Plan  Evaluate progression towards full WB over L wrist and progress therex PRN    PT Home Exercise Plan  wrist self-distraction, dog leash-assisted carpal  mobilization, weight shifts onto quad    Consulted and Agree with Plan of Care  Patient       Patient will benefit from skilled therapeutic intervention in order to improve the following deficits and impairments:  Decreased range of motion, Increased fascial restricitons, Pain, Decreased endurance, Impaired UE functional use, Hypomobility, Impaired flexibility, Decreased mobility, Decreased strength  Visit Diagnosis: Epicondylitis elbow, medial, right     Problem List Patient Active Problem List   Diagnosis Date Noted  . S/P shoulder surgery 07/09/2019  . Insomnia due to stress 07/09/2019  . Post-menopausal atrophic vaginitis 03/10/2019  . Cough productive of clear sputum 03/10/2019  . Drug-induced neutropenia ([place]) 07/08/2018  . Abnormal weight loss 07/08/2018  . Abnormal blood level of cobalt 06/27/2018  . 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 Melanoma of skin ([place]) 07/27/2015  . History of total right hip arthroplasty 02/09/2015  . Osteopenia 05/25/2014  . 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 History of nephrolithiasis 07/13/2013  . Adverse drug reaction 06/29/2013  . History of adenomatous polyp of colon 06/01/2013  . Hemochromatosis carrier   . Initial Medicare annual wellness visit 05/07/2013  . Hypothyroidism 09/25/2011  . History of idiopathic seizure 09/25/2011  . Screening for cervical cancer 09/25/2011  . Screening for breast cancer 09/25/2011  . Neutropenia ([place])     Jennifr Gaeta 12/28/2019, 11:21 AM  [facility] [address], [place], [address] Phone: [phone]   Fax:  [phone]  Name: Vernella Niznik MRN: [id number] Date of Birth: 11/18/1951

## 2020-01-11 ENCOUNTER — Other Ambulatory Visit: Payer: Self-pay

## 2020-01-11 ENCOUNTER — Ambulatory Visit: Payer: Medicare Other | Attending: Sports Medicine

## 2020-01-11 DIAGNOSIS — M7701 Medial epicondylitis, right elbow: Secondary | ICD-10-CM | POA: Insufficient documentation

## 2020-01-11 NOTE — Therapy (Signed)
[facility] [address], [place], [address] Phone: [phone]   Fax:  [phone]  Physical Therapy Treatment  Patient Details  Name: Alexis Mcdonald MRN: [id number] Date of Birth: 06/09/1951 Referring Provider (PT): Candelaria Stagers MD   Encounter Date: 01/11/2020  PT End of Session - 01/11/20 0938    Visit Number  14    Number of Visits  21    Date for PT Re-Evaluation  01/04/20    Authorization Type  4/10    PT Start Time  0900    PT Stop Time  0925    PT Time Calculation (min)  25 min    Activity Tolerance  Patient tolerated treatment well;No increased pain    Behavior During Therapy  WFL for tasks assessed/performed       Past Medical History:  Diagnosis Date  . Borderline osteopenia    prior DEXA at [facility]   . Cancer ([place])    Melanoma removed from right hip  . Colon polyp 2009   next one due 2012  . 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 History of kidney stones   . Hypothyroidism   . Kidney stones   . Neutropenia    no infections,  bone marrow biopsy by [name] next week  . renal calculi    s/p lithotripsy, [place]  . Thyroid disease    hypothyroidism    Past Surgical History:  Procedure Laterality Date  . COLONOSCOPY  03/23/2010   Diverticulosis  . COLONOSCOPY W/ BIOPSIES  03/14/2006   Tubular adenoma of the rectum x2, upper lesion with focal high-grade dysplasia.  Polyps 9 mm in size.  Marland Kitchen COLONOSCOPY WITH PROPOFOL N/A 10/21/2018   Procedure: COLONOSCOPY WITH PROPOFOL;  Surgeon: Robert Bellow, MD;  Location: [facility];  Service: Endoscopy;  Laterality: N/A;  . DILATION AND CURETTAGE OF UTERUS    . JOINT REPLACEMENT  2005   done in [place]  . kidney stones removal    . MELANOMA EXCISION  06/29/2016  . right hip replacement    . SHOULDER ARTHROSCOPY WITH OPEN ROTATOR CUFF REPAIR Left 12/01/2018   Procedure: SHOULDER ARTHROSCOPY  WITH OPEN ROTATOR CUFF REPAIR;  Surgeon: Corky Mull, MD;  Location: [facility] ORS;  Service: Orthopedics;  Laterality: Left;    There were no vitals filed for this visit.  Subjective Assessment - 01/11/20 0935    Subjective  Patient states she has been significantly improving. She reports she has improved considerably and is prepared for discharge.    Pertinent History  L rotator cuff repair with tenodesis 12/19    Limitations  Lifting;House hold activities    How long can you sit comfortably?  unlimited    How long can you stand comfortably?  unlimited    How long can you walk comfortably?  unlimited    Patient Stated Goals  Return to prior activities    Currently in Pain?  No/denies    Pain Onset  More than a month ago    Pain Onset  More than a month ago       TREATMENT Manual therapy Carpal-metacarpal mobilizations along 2nd digit with patient positioned in sitting with wrist hanging off table - 10 x 30sec grade IV Ray splaying between 2nd and third digits on the affected side with patient positioned in sitting -- 5 x 30sec 2nd-3rd metacarpal mobilizations in sitting grade IV to improve movement  and mobilizations Taught patient self ray splaying and mobilization techniques with use a table to be performed at home   PT Education - 01/11/20 0937    Education Details  form/technique with exercise    Person(s) Educated  Patient    Methods  Explanation;Demonstration    Comprehension  Verbalized understanding;Returned demonstration       PT Short Term Goals - 10/26/19 0958      PT SHORT TERM GOAL #1   Title  Patient will be complaint with HEP as adjunct to therapy to improve recovery and reduce total number of visits.    Baseline  HEP given    Time  1    Period  Weeks    Status  Achieved    Target Date  10/26/19        PT Long Term Goals - 01/11/20 0953      PT LONG TERM GOAL #1   Title  Patient will reduce pain to 1/10 with aggravating activity    Baseline  4/10 with  aggravating activity; 12/07/2019 0/10 but limited to under 10#    Time  6    Period  Weeks    Status  Partially Met      PT LONG TERM GOAL #2   Title  Patient will report no increased symptoms with 30 min driving with standard steering wheel grip for safety with ADLs.    Baseline  Modified grip for driving; 97/08/8920 pt reports still using modified grip but no longer necessary    Time  6    Period  Weeks    Status  Achieved      PT LONG TERM GOAL #3   Title  Patient will demonstrate ability to hold plank with good form for 30 sec with no increase in symptoms.    Baseline  Cannot hold plank due to pain; 12/07/2019 can hold modified quadruped but not plank; 01/11/2020 able to hold plank for over 1 minutes.    Time  6    Period  Weeks    Status  On-going            Plan - 01/11/20 0942    Clinical Impression Statement  Patient has made significant improvements with pain along both the elbow and the wrist. She is able to exercise to the same capacity as she had before therapy and reports her elbow and wrist has continued to improve. Patient has met all long term goals and is to be discharged from therapy.    Personal Factors and Comorbidities  Age;Comorbidity 1    Comorbidities  L rotator cuff repair with tenodesis    Examination-Participation Restrictions  Driving;Other   Exercise   Stability/Clinical Decision Making  Stable/Uncomplicated    Rehab Potential  Excellent    PT Frequency  Biweekly    PT Duration  6 weeks    PT Treatment/Interventions  Moist Heat;Ultrasound;Electrical Stimulation;Cryotherapy;Therapeutic activities;Therapeutic exercise;Patient/family education;Neuromuscular re-education;Manual techniques;Passive range of motion;Dry needling;Taping    PT Next Visit Plan  Evaluate progression towards full WB over L wrist and progress therex PRN    PT Home Exercise Plan  wrist self-distraction, dog leash-assisted carpal mobilization, weight shifts onto quad    Consulted and  Agree with Plan of Care  Patient       Patient will benefit from skilled therapeutic intervention in order to improve the following deficits and impairments:  Decreased range of motion, Increased fascial restricitons, Pain, Decreased endurance, Impaired UE functional use, Hypomobility, Impaired flexibility, Decreased  mobility, Decreased strength  Visit Diagnosis: Epicondylitis elbow, medial, right     Problem List Patient Active Problem List   Diagnosis Date Noted  . S/P shoulder surgery 07/09/2019  . Insomnia due to stress 07/09/2019  . Post-menopausal atrophic vaginitis 03/10/2019  . Cough productive of clear sputum 03/10/2019  . Drug-induced neutropenia ([place]) 07/08/2018  . Abnormal weight loss 07/08/2018  . Abnormal blood level of cobalt 06/27/2018  . 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 Melanoma of skin ([place]) 07/27/2015  . History of total right hip arthroplasty 02/09/2015  . Osteopenia 05/25/2014  . 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 History of nephrolithiasis 07/13/2013  . Adverse drug reaction 06/29/2013  . History of adenomatous polyp of colon 06/01/2013  . Hemochromatosis carrier   . Initial Medicare annual wellness visit 05/07/2013  . Hypothyroidism 09/25/2011  . History of idiopathic seizure 09/25/2011  . Screening for cervical cancer 09/25/2011  . Screening for breast cancer 09/25/2011  . Neutropenia ([place])     Blythe Stanford, PT DPT 01/11/2020, 10:47 AM  [facility] [address] [place], [address] Phone: [phone]   Fax:  [phone]  Name: Alexis Mcdonald MRN: [id number] Date of Birth: 1951/07/14

## 2020-02-09 ENCOUNTER — Encounter: Payer: Self-pay | Admitting: Internal Medicine

## 2020-02-09 ENCOUNTER — Other Ambulatory Visit: Payer: Self-pay

## 2020-02-09 ENCOUNTER — Ambulatory Visit (INDEPENDENT_AMBULATORY_CARE_PROVIDER_SITE_OTHER): Payer: Medicare Other | Admitting: Internal Medicine

## 2020-02-09 ENCOUNTER — Ambulatory Visit (INDEPENDENT_AMBULATORY_CARE_PROVIDER_SITE_OTHER): Payer: Medicare Other

## 2020-02-09 VITALS — Ht 65.0 in | Wt 115.0 lb

## 2020-02-09 DIAGNOSIS — Z1159 Encounter for screening for other viral diseases: Secondary | ICD-10-CM | POA: Diagnosis not present

## 2020-02-09 DIAGNOSIS — L603 Nail dystrophy: Secondary | ICD-10-CM | POA: Diagnosis not present

## 2020-02-09 DIAGNOSIS — Z0000 Encounter for general adult medical examination without abnormal findings: Secondary | ICD-10-CM

## 2020-02-09 NOTE — Progress Notes (Addendum)
Subjective:   Alexis Mcdonald is a 69 y.o. female who presents for Medicare Annual (Subsequent) preventive examination.  Review of Systems:  No ROS.  Medicare Wellness Virtual Visit.  Visual/audio telehealth visit, UTA vital signs.   Ht/Wt provided.  See social history for additional risk factors.   Cardiac Risk Factors include: advanced age (>34mn, >>57women)     Objective:     Vitals: Ht 5' 5"  (1.651 m)   Wt 115 lb (52.2 kg)   BMI 19.14 kg/m   Body mass index is 19.14 kg/m.  Advanced Directives 02/09/2020 02/04/2019 12/01/2018 11/24/2018 10/21/2018 07/08/2018  Does Patient Have a Medical Advance Directive? Yes Yes Yes Yes Yes Yes  Type of AParamedicof ACerro GordoLiving will H[place]uns[place]Living will HGordon HeightsLiving will HSanta BarbaraLiving will HVandervoortLiving will Living will;Healthcare Power of Attorney  Does patient want to make changes to medical advance directive? No - Patient declined No - Patient declined No - Patient declined No - Patient declined - Yes (MAU/Ambulatory/Procedural Areas - Information given)  Copy of [place]in Chart? Yes - validated most recent copy scanned in chart (See row information) No - copy requested No - copy requested No - copy requested - Yes    Tobacco Social History   Tobacco Use  Smoking Status Former Smoker  . Types: Cigarettes  . Quit date: 09/24/2000  . Years since quitting: 19.3  Smokeless Tobacco Never Used     Counseling given: Not Answered   Clinical Intake:  Pre-visit preparation completed: Yes        Diabetes: No  How often do you need to have someone help you when you read instructions, pamphlets, or other written materials from your doctor or pharmacy?: 1 - Never  Interpreter Needed?: No     Past Medical History:  Diagnosis Date  . Borderline osteopenia    [facility]  . [place]    Melanoma removed from right hip  . Colon polyp 2009   next one due 2012  . 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 History of kidney stones   . Hypothyroidism   . Kidney stones   . Neutropenia    no infections,  bone marrow biopsy by [facility]  . renal calculi    s/p lithotripsy, [facility]  . Thyroid disease    hypothyroidism   Past Surgical History:  Procedure Laterality Date  . COLONOSCOPY  03/23/2010   Diverticulosis  . COLONOSCOPY W/ BIOPSIES  03/14/2006   Tubular adenoma of the rectum x2, upper lesion with focal high-grade dysplasia.  Polyps 9 mm in size.  .Marland KitchenCOLONOSCOPY WITH PROPOFOL N/A 10/21/2018   Procedure: COLONOSCOPY WITH PROPOFOL;  Surgeon: BRobert Bellow MD;  Location: [facility] ENDOSCOPY;  Service: Endoscopy;  Laterality: N/A;  . DILATION AND CURETTAGE OF UTERUS    . [place]  [place]   [place]    . MELANOMA EXCISION  06/29/2016  . right hip replacement    . SHOULDER ARTHROSCOPY WITH OPEN ROTATOR CUFF REPAIR Left 12/01/2018   Procedure: SHOULDER ARTHROSCOPY WITH OPEN ROTATOR CUFF REPAIR;  Surgeon: PCorky Mull MD;  Location: [facility] ORS;  Service: Orthopedics;  Laterality: Left;   Family History  Problem Relation Age of Onset  . Diabetes Mother   . Stroke Mother   .  Hyperlipidemia Mother   . Hypertension Mother   . Alcohol abuse Father   . Diabetes Brother   . Alcohol abuse Brother   . Hypertension Brother   . Alcohol abuse Brother   . Heart disease Maternal Uncle   . Breast cancer Neg Hx    Social History   Socioeconomic History  . Marital status: Divorced    Spouse name: Not on file  . Number of children: 2  . Years of education: Not on file  . Highest education level: Not on file  Occupational History  . Occupation: Therapist, nutritional: [employer]  Tobacco Use  . Smoking status: Former Smoker    Types: Cigarettes    Quit date:  09/24/2000    Years since quitting: 19.3  . Smokeless tobacco: Never Used  Substance and Sexual Activity  . Alcohol use: Yes    Comment: "weekends"  . Drug use: No  . Sexual activity: Not Currently  Other Topics Concern  . Not on file  Social History Narrative   Patient lived most her her life in [place] and moved to [place] to help care for aging mother   Social Determinants of Health   Financial Resource Strain:   . Difficulty of Paying Living Expenses: Not on file  Food Insecurity:   . Worried About Charity fundraiser in the Last Year: Not on file  . Ran Out of Food in the Last Year: Not on file  Transportation Needs:   . Lack of Transportation (Medical): Not on file  . Lack of Transportation (Non-Medical): Not on file  Physical Activity:   . Days of Exercise per Week: Not on file  . Minutes of Exercise per Session: Not on file  Stress:   . Feeling of Stress : Not on file  Social Connections:   . Frequency of Communication with Friends and Family: Not on file  . Frequency of Social Gatherings with Friends and Family: Not on file  . Attends Religious Services: Not on file  . Active Member of Clubs or Organizations: Not on file  . Attends Archivist Meetings: Not on file  . Marital Status: Not on file    Outpatient Encounter Medications as of 02/09/2020  Medication Sig  . ALPRAZolam (XANAX) 0.25 MG tablet Take 0.5 tablets (0.125 mg total) by mouth at bedtime as needed for sleep.  . Cholecalciferol (VITAMIN D) 2000 units CAPS Take 2,000 Units by mouth daily.   . EUTHYROX 75 MCG tablet TAKE 1 TABLET BY MOUTH ONCE DAILY BEFORE BREAKFAST  . fexofenadine-pseudoephedrine (ALLEGRA-D) 60-120 MG 12 hr tablet Take 1 tablet by mouth daily as needed (allergies).  . fish oil-omega-3 fatty acids 1000 MG capsule Take 1 g by mouth daily.   . nicotine polacrilex (NICORETTE) 4 MG gum Take 4 mg by mouth as needed for smoking cessation.   . psyllium (REGULOID) 0.52 g capsule Take 0.52 g  by mouth daily.  . [DISCONTINUED] celecoxib (CELEBREX) 200 MG capsule Take by mouth.   No facility-administered encounter medications on file as of 02/09/2020.    Activities of Daily Living In your present state of health, do you have any difficulty performing the following activities: 02/09/2020  Hearing? N  Vision? N  Difficulty concentrating or making decisions? N  Walking or climbing stairs? N  Dressing or bathing? N  Doing errands, shopping? N  Preparing Food and eating ? N  Using the Toilet? N  In the past six months, have  you accidently leaked urine? N  Do you have problems with loss of bowel control? N  Managing your Medications? N  Managing your Finances? N  Housekeeping or managing your Housekeeping? N  Some recent data might be hidden    Patient Care Team: [name], MD as PCP - General (Internal Medicine) Christene Lye, MD (General Surgery)    Assessment:   This is a routine wellness examination for [place].  Nurse connected with patient 02/09/20 at 10:30 AM EST by a telephone enabled telemedicine application and verified that I am speaking with the correct person using two identifiers. Patient stated full name and DOB. Patient gave permission to continue with virtual visit. Patient's location was at home and Nurse's location was at [place] office.   Patient is alert and oriented x3. Patient denies difficulty focusing or concentrating. Patient likes to read, paint and stays active for brain stimulation.   Health Maintenance Due: -Hepatitis C Screening- discussed. Consent given.  See completed HM at the end of note.   Eye: Visual acuity not assessed. Virtual visit. Followed by their ophthalmologist.  Dental: Visits every 6 months.    Hearing: Demonstrates normal hearing during visit.  Safety:  Patient feels safe at home- yes Patient does have smoke detectors at home- yes Patient does wear sunscreen or protective clothing when in direct  sunlight - yes Patient does wear seat belt when in a moving vehicle - yes Patient drives- yes Adequate lighting in walkways free from debris- yes Grab bars and handrails used as appropriate- yes Ambulates with an assistive device- no Cell phone on person when ambulating outside of the home- yes  Social: Alcohol intake - yes      Smoking history- former   Smokers in home? none Illicit drug use? none  Medication: Taking as directed and without issues.  Self managed - yes   Covid-19: Precautions and sickness symptoms discussed. Wears mask, social distancing, hand hygiene as appropriate.   Activities of Daily Living Patient denies needing assistance with: household chores, feeding themselves, getting from bed to chair, getting to the toilet, bathing/showering, dressing, managing money, or preparing meals.   Discussed the importance of a healthy diet, water intake and the benefits of aerobic exercise.   Physical activity- yoga, long distance swimming, water fitness, cardio, pilates, and hiking.   Diet:  Regular Water: good intake Caffeine: minimal coffee in the morning  Other Providers Patient Care Team: [name], MD as PCP - General (Internal Medicine) Christene Lye, MD (General Surgery)  Exercise Activities and Dietary recommendations Current Exercise Habits: Home exercise routine, Type of exercise: stretching;strength training/weights;calisthenics;yoga(swimming), Time (Minutes): > 60, Frequency (Times/Week): 7, Weekly Exercise (Minutes/Week): 0, Intensity: Mild  Goals    . Maintain Healthy Lifestyle       Fall Risk Fall Risk  02/09/2020 09/28/2019 07/09/2019 02/04/2019 06/24/2018  Falls in the past year? 0 0 1 0 Yes  Number falls in past yr: - 0 0 - 1  Injury with Fall? - - 0 - No  Follow up Falls evaluation completed Falls evaluation completed - - -   Timed Get Up and Go performed: no, virtual visit  Depression Screen PHQ 2/9 Scores 02/09/2020 09/28/2019  02/04/2019 06/24/2018  PHQ - 2 Score 0 0 0 2  PHQ- 9 Score - - - 7     Cognitive Function     6CIT Screen 02/09/2020 02/04/2019  What Year? 0 points 0 points  What month? 0 points 0 points  What time? 0 points 0 points  Count back from 20 0 points 0 points  Months in reverse 0 points 0 points  Repeat phrase 0 points 0 points  Total Score 0 0    Immunization History  Administered Date(s) Administered  . Influenza, High Dose Seasonal PF 10/14/2019  . Influenza,inj,Quad PF,6+ Mos 09/24/2014, 10/10/2015, 08/20/2016  . Influenza-Unspecified 09/29/2012, 09/29/2017, 09/08/2018  . Pneumococcal Conjugate-13 09/29/2017  . Pneumococcal Polysaccharide-23 02/04/2019  . Tdap 05/07/2013  . Zoster 06/10/2013   Screening Tests Health Maintenance  Topic Date Due  . Hepatitis C Screening  07/28/1951  . MAMMOGRAM  11/09/2020  . TETANUS/TDAP  05/08/2023  . COLONOSCOPY  10/21/2028  . INFLUENZA VACCINE  Completed  . DEXA SCAN  Completed  . PNA vac Low Risk Adult  Completed      Plan:  Keep all routine maintenance appointments.   Follow up with your doctor today @ 11:00.  Medicare Attestation I have personally reviewed: The patient's medical and social history Their use of alcohol, tobacco or illicit drugs Their current medications and supplements The patient's functional ability including ADLs,fall risks, home safety risks, cognitive, and hearing and visual impairment Diet and physical activities Evidence for depression   I have reviewed and discussed with patient certain preventive protocols, quality metrics, and best practice recommendations.      OBrien-Blaney, Yordi Krager L, LPN  4/70/[id number]    I have reviewed the above information and agree with above.   Deborra Medina, MD

## 2020-02-09 NOTE — Progress Notes (Signed)
Great toenail on right foot is split and would like to know if there is something she needs to do for it.

## 2020-02-09 NOTE — Progress Notes (Signed)
Virtual Visit via [email or web address]  This visit type was conducted due to national recommendations for restrictions regarding the COVID-19 pandemic (e.g. social distancing).  This format is felt to be most appropriate for this patient at this time.  All issues noted in this document were discussed and addressed.  No physical exam was performed (except for noted visual exam findings with Video Visits).   I connected with@ on 02/09/20 at 11:00 AM EST by a video enabled telemedicine application  and verified that I am speaking with the correct person using two identifiers. Location patient: home Location provider: work or home office Persons participating in the virtual visit: patient, provider  I discussed the limitations, risks, security and privacy concerns of performing an evaluation and management service by telephone and the availability of in person appointments. I also discussed with the patient that there may be a patient responsible charge related to this service. The patient expressed understanding and agreed to proceed.  Reason for visit: toenail   HPI:  69 yr old female with history of onychomycosis presents with a longitudinal split of right great toenail that started two months ago .   No history of trauma  But had toenail fungus last year that resolved after using terbinafine which created a body rash.  The split did initially worsen but the nail has grown out from the cuticle intact without a split .  She has not been swimming or engaging in any activities that have resulted in feet staying damp.    ROS: See pertinent positives and negatives per HPI.  Past Medical History:  Diagnosis Date  . Borderline osteopenia    [facility]   . Cancer ([place])    Melanoma removed from right hip  . Colon polyp 2009   next one due 2012  . 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 History of kidney stones   . Hypothyroidism   . Kidney stones   .  Neutropenia    no infections,  bone marrow biopsy by [name] next week  . renal calculi    s/p lithotripsy, [facility]  . Thyroid disease    hypothyroidism    Past Surgical History:  Procedure Laterality Date  . COLONOSCOPY  03/23/2010   Diverticulosis  . COLONOSCOPY W/ BIOPSIES  03/14/2006   Tubular adenoma of the rectum x2, upper lesion with focal high-grade dysplasia.  Polyps 9 mm in size.  Marland Kitchen COLONOSCOPY WITH PROPOFOL N/A 10/21/2018   Procedure: COLONOSCOPY WITH PROPOFOL;  Surgeon: Robert Bellow, MD;  Location: [facility] ENDOSCOPY;  Service: Endoscopy;  Laterality: N/A;  . DILATION AND CURETTAGE OF UTERUS    . JOINT REPLACEMENT  2005   done in [place]  . kidney stones removal    . MELANOMA EXCISION  06/29/2016  . right hip replacement    . SHOULDER ARTHROSCOPY WITH OPEN ROTATOR CUFF REPAIR Left 12/01/2018   Procedure: SHOULDER ARTHROSCOPY WITH OPEN ROTATOR CUFF REPAIR;  Surgeon: Corky Mull, MD;  Location: [facility] ORS;  Service: Orthopedics;  Laterality: Left;    Family History  Problem Relation Age of Onset  . Diabetes Mother   . Stroke Mother   . Hyperlipidemia Mother   . Hypertension Mother   . Alcohol abuse Father   . Diabetes Brother   . Alcohol abuse Brother   . Hypertension Brother   . Alcohol abuse Brother   . Heart disease Maternal Uncle   . Breast cancer Neg Hx  SOCIAL HX:  reports that she quit smoking about 19 years ago. Her smoking use included cigarettes. She has never used smokeless tobacco. She reports current alcohol use. She reports that she does not use drugs.   Current Outpatient Medications:  .  ALPRAZolam (XANAX) 0.25 MG tablet, Take 0.5 tablets (0.125 mg total) by mouth at bedtime as needed for sleep., Disp: 30 tablet, Rfl: 5 .  Cholecalciferol (VITAMIN D) 2000 units CAPS, Take 2,000 Units by mouth daily. , Disp: , Rfl:  .  EUTHYROX 75 MCG tablet, TAKE 1 TABLET BY MOUTH ONCE DAILY BEFORE BREAKFAST, Disp: 90 tablet, Rfl: 0 .   fexofenadine-pseudoephedrine (ALLEGRA-D) 60-120 MG 12 hr tablet, Take 1 tablet by mouth daily as needed (allergies)., Disp: , Rfl:  .  fish oil-omega-3 fatty acids 1000 MG capsule, Take 1 g by mouth daily. , Disp: , Rfl:  .  nicotine polacrilex (NICORETTE) 4 MG gum, Take 4 mg by mouth as needed for smoking cessation. , Disp: , Rfl:  .  psyllium (REGULOID) 0.52 g capsule, Take 0.52 g by mouth daily., Disp: , Rfl:   EXAM:  VITALS per patient if applicable:  GENERAL: alert, oriented, appears well and in no acute distress  HEENT: atraumatic, conjunttiva clear, no obvious abnormalities on inspection of external nose and ears  NECK: normal movements of the head and neck  LUNGS: on inspection no signs of respiratory distress, breathing rate appears normal, no obvious gross SOB, gasping or wheezing  CV: no obvious cyanosis  MS: moves all visible extremities without noticeable abnormality  Ext: right great toenail with offcenter split longitudinal (Picture attached to [employer])   PSYCH/NEURO: pleasant and cooperative, no obvious depression or anxiety, speech and thought processing grossly intact  ASSESSMENT AND PLAN:  Discussed the following assessment and plan:  Dystrophic nail  Dystrophic nail No evidence of onychomycosis currently .  Continue to keep nail covered to avoid distal trauma     I discussed the assessment and treatment plan with the patient. The patient was provided an opportunity to ask questions and all were answered. The patient agreed with the plan and demonstrated an understanding of the instructions.   The patient was advised to call back or seek an in-person evaluation if the symptoms worsen or if the condition fails to improve as anticipated.  I provided 20 minutes of non-face-to-face time during this encounter.   [name], MD

## 2020-02-09 NOTE — Patient Instructions (Addendum)
  [name] , Thank you for taking time to come for your Medicare Wellness Visit. I appreciate your ongoing commitment to your health goals. Please review the following plan we discussed and let me know if I can assist you in the future.   These are the goals we discussed: Goals    . Maintain Healthy Lifestyle       This is a list of the screening recommended for you and due dates:  Health Maintenance  Topic Date Due  .  Hepatitis C: One time screening is recommended by Center for Disease Control  (CDC) for  adults born from [birth year] through 1966.   19-Jun-1951  . Mammogram  11/09/2020  . Tetanus Vaccine  05/08/2023  . Colon Cancer Screening  10/21/2028  . Flu Shot  Completed  . DEXA scan (bone density measurement)  Completed  . Pneumonia vaccines  Completed

## 2020-02-10 DIAGNOSIS — L603 Nail dystrophy: Secondary | ICD-10-CM | POA: Insufficient documentation

## 2020-02-10 NOTE — Assessment & Plan Note (Signed)
No evidence of onychomycosis currently .  Continue to keep nail covered to avoid distal trauma

## 2020-02-26 ENCOUNTER — Other Ambulatory Visit: Payer: Self-pay | Admitting: Internal Medicine

## 2020-03-13 DIAGNOSIS — Z780 Asymptomatic menopausal state: Secondary | ICD-10-CM

## 2020-05-02 ENCOUNTER — Encounter: Payer: Self-pay | Admitting: Internal Medicine

## 2020-05-22 ENCOUNTER — Ambulatory Visit
Admission: RE | Admit: 2020-05-22 | Discharge: 2020-05-22 | Disposition: A | Discharge status: Home / Self Care | Payer: Medicare Other | Source: Ambulatory Visit | Attending: Internal Medicine | Admitting: Internal Medicine

## 2020-05-22 DIAGNOSIS — M818 Other osteoporosis without current pathological fracture: Secondary | ICD-10-CM | POA: Diagnosis not present

## 2020-05-22 DIAGNOSIS — Z780 Asymptomatic menopausal state: Secondary | ICD-10-CM | POA: Insufficient documentation

## 2020-05-25 ENCOUNTER — Encounter: Payer: Self-pay | Admitting: Internal Medicine

## 2020-05-30 ENCOUNTER — Other Ambulatory Visit: Payer: Self-pay | Admitting: Internal Medicine

## 2020-06-08 DIAGNOSIS — F418 Other specified anxiety disorders: Secondary | ICD-10-CM | POA: Diagnosis not present

## 2020-06-20 ENCOUNTER — Other Ambulatory Visit: Payer: Self-pay

## 2020-06-20 ENCOUNTER — Encounter: Payer: Self-pay | Admitting: Internal Medicine

## 2020-06-20 ENCOUNTER — Ambulatory Visit (INDEPENDENT_AMBULATORY_CARE_PROVIDER_SITE_OTHER): Payer: Medicare Other | Admitting: Internal Medicine

## 2020-06-20 DIAGNOSIS — M816 Localized osteoporosis [Lequesne]: Secondary | ICD-10-CM

## 2020-06-20 NOTE — Progress Notes (Signed)
Subjective:  Patient ID: Alexis Mcdonald, female    DOB: 1951-04-09  Age: 68 y.o. MRN: [id number]  CC: The encounter diagnosis was Localized osteoporosis without current pathological fracture.  HPI: Alexis Mcdonald presents for follow up on recent DEXA scan diagnostic of osteoporosis  This visit occurred during the SARS-CoV-2 public health emergency.  Safety protocols were in place, including screening questions prior to the visit, additional usage of staff PPE, and extensive cleaning of exam room while observing appropriate contact time as indicated for disinfecting solutions.   She has no history of fractures.  Works out daily.  Not taking calcium supplements .  Risk factors include   FH and lean BMI  Discussed the various options of treatment.   Outpatient Medications Prior to Visit  Medication Sig Dispense Refill  . ALPRAZolam (XANAX) 0.25 MG tablet Take 0.5 tablets (0.125 mg total) by mouth at bedtime as needed for sleep. 30 tablet 5  . cholecalciferol (VITAMIN D3) 25 MCG (1000 UNIT) tablet Take 1,000 Units by mouth daily.    Arna Medici 75 MCG tablet TAKE 1 TABLET BY MOUTH ONCE DAILY BEFORE BREAKFAST 90 tablet 0  . fexofenadine-pseudoephedrine (ALLEGRA-D) 60-120 MG 12 hr tablet Take 1 tablet by mouth daily as needed (allergies).    . fish oil-omega-3 fatty acids 1000 MG capsule Take 1 g by mouth daily.     . nicotine polacrilex (NICORETTE) 2 MG gum Take 2 mg by mouth as needed for smoking cessation.    . psyllium (REGULOID) 0.52 g capsule Take 0.52 g by mouth daily.    . Cholecalciferol (VITAMIN D) 2000 units CAPS Take 2,000 Units by mouth daily.     . nicotine polacrilex (NICORETTE) 4 MG gum Take 4 mg by mouth as needed for smoking cessation.      No facility-administered medications prior to visit.    Review of Systems;  Patient denies headache, fevers, malaise, unintentional weight loss, skin rash, eye pain, sinus congestion and sinus pain, sore  throat, dysphagia,  hemoptysis , cough, dyspnea, wheezing, chest pain, palpitations, orthopnea, edema, abdominal pain, nausea, melena, diarrhea, constipation, flank pain, dysuria, hematuria, urinary  Frequency, nocturia, numbness, tingling, seizures,  Focal weakness, Loss of consciousness,  Tremor, insomnia, depression, anxiety, and suicidal ideation.      Objective:  BP 132/76 (BP Location: Left Arm, Patient Position: Sitting, Cuff Size: Normal)   Pulse (!) 56   Temp 97.9 F (36.6 C) (Temporal)   Resp 14   Ht 5\' 5"  (1.651 m)   Wt 116 lb 3.2 oz (52.7 kg)   SpO2 98%   BMI 19.34 kg/m   BP Readings from Last 3 Encounters:  06/20/20 132/76  03/09/19 112/70  02/04/19 112/62    Wt Readings from Last 3 Encounters:  06/20/20 116 lb 3.2 oz (52.7 kg)  02/09/20 115 lb (52.2 kg)  02/09/20 115 lb (52.2 kg)    General appearance: alert, cooperative and appears stated age Ears: normal TM's and external ear canals both ears Throat: lips, mucosa, and tongue normal; teeth and gums normal Neck: no adenopathy, no carotid bruit, supple, symmetrical, trachea midline and thyroid not enlarged, symmetric, no tenderness/mass/nodules Back: symmetric, no curvature. ROM normal. No CVA tenderness. Lungs: clear to auscultation bilaterally Heart: regular rate and rhythm, S1, S2 normal, no murmur, click, rub or gallop Abdomen: soft, non-tender; bowel sounds normal; no masses,  no organomegaly Pulses: 2+ and symmetric Skin: Skin color, texture, turgor normal. No rashes or lesions Lymph nodes: Cervical, supraclavicular, and  axillary nodes normal.  No results found for: HGBA1C  Lab Results  Component Value Date   CREATININE 0.71 11/03/2018   CREATININE 0.62 05/26/2018   CREATININE 0.70 08/20/2016    Lab Results  Component Value Date   WBC 2.9 (L) 11/24/2018   HGB 12.5 11/24/2018   HCT 38.9 11/24/2018   PLT 149 (L) 11/24/2018   GLUCOSE 94 11/03/2018   CHOL 203 (H) 05/26/2018   TRIG 45.0  05/26/2018   HDL 95.10 05/26/2018   LDLCALC 99 05/26/2018   ALT 13 11/03/2018   AST 18 11/03/2018   NA 137 11/03/2018   K 3.6 11/03/2018   CL 103 11/03/2018   CREATININE 0.71 11/03/2018   BUN 26 (H) 11/03/2018   CO2 28 11/03/2018   TSH 3.27 06/24/2018    DG Bone Density  Result Date: 05/22/2020 EXAM: DUAL X-RAY ABSORPTIOMETRY (DXA) FOR BONE MINERAL DENSITY IMPRESSION: Your patient Alexis Mcdonald completed a BMD test on 05/22/2020 using the [employer] (software version: 14.10) manufactured by [employer]. The following summarizes the results of our evaluation. Technologist: SCE PATIENT BIOGRAPHICAL: Name: Alexis, Mcdonald Patient ID: [id number] Birth Date: 12-Dec-1951 Height: 63.5 in. Gender: Female Exam Date: 05/22/2020 Weight: 116.5 lbs. Indications: Caucasian, Height Loss, Hypothyroid, Osteopenia, Parent Hip Fracture, Postmenopausal, right hip replacement Fractures: Treatments: Euthyox, Vitamin D DENSITOMETRY RESULTS: Site       Region Measured Date Measured Age WHO Classification Young Adult T-score BMD         %Change vs. Previous Significant Change (*) AP Spine L1-L2 05/22/2020 69.7 Osteopenia -1.2 1.032 g/cm2 - - Left Femur Total 05/22/2020 69.7 Osteoporosis -2.5 0.697 g/cm2 - - ASSESSMENT: The BMD measured at Femur Total is 0.697 g/cm2 with a T-score of -2.5. This patient is considered osteoporotic according to [facility]) criteria. The scan quality is good. Right femur was excluded due to surgical hardware. L3 and L4 was excluded due to degenerative changes. World Pharmacologist [facility]) criteria for post-menopausal, Caucasian Women: Normal:                   T-score at or above -1 SD Osteopenia/low bone mass: T-score between -1 and -2.5 SD Osteoporosis:             T-score at or below -2.5 SD RECOMMENDATIONS: 1. All patients should optimize calcium and vitamin D intake. 2. Consider FDA-approved medical therapies in  postmenopausal women and men aged 78 years and older, based on the following: a. A hip or vertebral(clinical or morphometric) fracture b. T-score < -2.5 at the femoral neck or spine after appropriate evaluation to exclude secondary causes c. Low bone mass (T-score between -1.0 and -2.5 at the femoral neck or spine) and a 10-year probability of a hip fracture > 3% or a 10-year probability of a major osteoporosis-related fracture > 20% based on the [place]-adapted WHO algorithm 3. Clinician judgment and/or patient preferences may indicate treatment for people with 10-year fracture probabilities above or below these levels FOLLOW-UP: People with diagnosed cases of osteoporosis or at high risk for fracture should have regular bone mineral density tests. For patients eligible for Medicare, routine testing is allowed once every 2 years. The testing frequency can be increased to one year for patients who have rapidly progressing disease, those who are receiving or discontinuing medical therapy to restore bone mass, or have additional risk factors. I have reviewed this report, and agree with the above findings. [facility] Electronically Signed  By: Lowella Grip III M.D.   On: 05/22/2020 13:56    Assessment & Plan:   Problem List Items Addressed This Visit      Unprioritized   Osteoporosis    Discussed treatment options,  Calcium and Vit d requirements.  She is contemplating use of Evista       Relevant Medications   cholecalciferol (VITAMIN D3) 25 MCG (1000 UNIT) tablet      I provided  30 minutes of  face-to-face time during this encounter reviewing patient's current problems and past surgeries, labs and imaging studies, providing counseling on the above mentioned problems , and coordination  of care . I have discontinued [name] "Patty"'s Vitamin D. I am also having her maintain her fish oil-omega-3 fatty acids, psyllium, fexofenadine-pseudoephedrine, ALPRAZolam,  Euthyrox, cholecalciferol, and nicotine polacrilex.  No orders of the defined types were placed in this encounter.   Medications Discontinued During This Encounter  Medication Reason  . Cholecalciferol (VITAMIN D) 2000 units CAPS Change in therapy  . nicotine polacrilex (NICORETTE) 4 MG gum Change in therapy    Follow-up: No follow-ups on file.   [name], MD

## 2020-06-21 NOTE — Assessment & Plan Note (Signed)
Discussed treatment options,  Calcium and Vit d requirements.  She is contemplating use of Evista

## 2020-08-06 DIAGNOSIS — M25561 Pain in right knee: Secondary | ICD-10-CM | POA: Diagnosis not present

## 2020-08-06 DIAGNOSIS — T148XXA Other injury of unspecified body region, initial encounter: Secondary | ICD-10-CM | POA: Diagnosis not present

## 2020-08-06 DIAGNOSIS — M7989 Other specified soft tissue disorders: Secondary | ICD-10-CM | POA: Diagnosis not present

## 2020-08-22 DIAGNOSIS — H43812 Vitreous degeneration, left eye: Secondary | ICD-10-CM | POA: Diagnosis not present

## 2020-08-24 DIAGNOSIS — F418 Other specified anxiety disorders: Secondary | ICD-10-CM | POA: Diagnosis not present

## 2020-08-29 ENCOUNTER — Other Ambulatory Visit: Payer: Self-pay | Admitting: Internal Medicine

## 2020-08-29 DIAGNOSIS — Z85820 Personal history of malignant melanoma of skin: Secondary | ICD-10-CM | POA: Diagnosis not present

## 2020-08-29 DIAGNOSIS — D2262 Melanocytic nevi of left upper limb, including shoulder: Secondary | ICD-10-CM | POA: Diagnosis not present

## 2020-08-29 DIAGNOSIS — L570 Actinic keratosis: Secondary | ICD-10-CM | POA: Diagnosis not present

## 2020-08-29 DIAGNOSIS — D2261 Melanocytic nevi of right upper limb, including shoulder: Secondary | ICD-10-CM | POA: Diagnosis not present

## 2020-08-29 DIAGNOSIS — D225 Melanocytic nevi of trunk: Secondary | ICD-10-CM | POA: Diagnosis not present

## 2020-08-29 DIAGNOSIS — X32XXXA Exposure to sunlight, initial encounter: Secondary | ICD-10-CM | POA: Diagnosis not present

## 2020-08-29 DIAGNOSIS — Z85828 Personal history of other malignant neoplasm of skin: Secondary | ICD-10-CM | POA: Diagnosis not present

## 2020-08-29 DIAGNOSIS — D2271 Melanocytic nevi of right lower limb, including hip: Secondary | ICD-10-CM | POA: Diagnosis not present

## 2020-09-18 DIAGNOSIS — Z23 Encounter for immunization: Secondary | ICD-10-CM | POA: Diagnosis not present

## 2020-09-25 DIAGNOSIS — F418 Other specified anxiety disorders: Secondary | ICD-10-CM | POA: Diagnosis not present

## 2020-09-26 ENCOUNTER — Encounter: Payer: Self-pay | Admitting: Internal Medicine

## 2020-09-26 ENCOUNTER — Other Ambulatory Visit: Payer: Self-pay

## 2020-09-26 ENCOUNTER — Telehealth (INDEPENDENT_AMBULATORY_CARE_PROVIDER_SITE_OTHER): Payer: Medicare Other | Admitting: Internal Medicine

## 2020-09-26 DIAGNOSIS — F331 Major depressive disorder, recurrent, moderate: Secondary | ICD-10-CM | POA: Diagnosis not present

## 2020-09-26 DIAGNOSIS — F329 Major depressive disorder, single episode, unspecified: Secondary | ICD-10-CM | POA: Insufficient documentation

## 2020-09-26 MED ORDER — VENLAFAXINE HCL ER 37.5 MG PO CP24: 37.5000 mg | ORAL_CAPSULE | Freq: Every day | ORAL | 1 refills | Status: DC

## 2020-09-26 NOTE — Progress Notes (Signed)
Virtual Visit via [place]  This visit type was conducted due to national recommendations for restrictions regarding the COVID-19 pandemic (e.g. social distancing).  This format is felt to be most appropriate for this patient at this time.  All issues noted in this document were discussed and addressed.  No physical exam was performed (except for noted visual exam findings with Video Visits).   I connected with@ on 09/26/20 at  4:30 PM EDT by a video enabled telemedicine application  and verified that I am speaking with the correct person using two identifiers. Location patient: home Location provider: work or home office Persons participating in the virtual visit: patient, provider  I discussed the limitations, risks, security and privacy concerns of performing an evaluation and management service by telephone and the availability of in person appointments. I also discussed with the patient that there may be a patient responsible charge related to this service. The patient expressed understanding and agreed to proceed.  Reason for visit: depression/anxiety  HPI:  69 yr old female presents with positive screen for depression and anxiety.  History of MDD managed ten years  Ago with Effexor , has not taken medication in over 5 years.  Over the last month several unresolved family conflicts have been emotionally disappointing and frustrating for her, including a prolonged estrangement from her son which does not seem to be able to be resolved ( she states that her requests to be part of the solution were rejected). She feels constantly anxious and irritable.  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  She denies weight loss but has been having insomnia due to excessive worrying,  But has not been practicing her meditation in weeks.   Denies suicidality and use of alcohol    ROS: See pertinent positives and negatives per HPI.  Past Medical History:  Diagnosis Date   . Borderline osteopenia    prior D[facility]A at [facility]   . Cancer ([place])    Melanoma removed from right hip  . Colon polyp 2009   next one due 2012  . 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 History of kidney stones   . Hypothyroidism   . Kidney stones   . Neutropenia    no infections,  bone marrow biopsy by [name] next week  . renal calculi    s/p lithotripsy, [place]  . Thyroid disease    hypothyroidism    Past Surgical History:  Procedure Laterality Date  . COLONOSCOPY  03/23/2010   Diverticulosis  . COLONOSCOPY W/ BIOPSIES  03/14/2006   Tubular adenoma of the rectum x2, upper lesion with focal high-grade dysplasia.  Polyps 9 mm in size.  Marland Kitchen COLONOSCOPY WITH PROPOFOL N/A 10/21/2018   Procedure: COLONOSCOPY WITH PROPOFOL;  Surgeon: Robert Bellow, MD;  Location: [facility] ENDOSCOPY;  Service: Endoscopy;  Laterality: N/A;  . DILATION AND CURETTAGE OF UTERUS    . JOINT REPLACEMENT  2005   done in [place]  . kidney stones removal    . MELANOMA EXCISION  06/29/2016  . right hip replacement    . SHOULDER ARTHROSCOPY WITH OPEN ROTATOR CUFF REPAIR Left 12/01/2018   Procedure: SHOULDER ARTHROSCOPY WITH OPEN ROTATOR CUFF REPAIR;  Surgeon: Corky Mull, MD;  Location: [facility] ORS;  Service: Orthopedics;  Laterality: Left;    Family History  Problem Relation Age of Onset  . Diabetes Mother   . Stroke Mother   .  Hyperlipidemia Mother   . Hypertension Mother   . Alcohol abuse Father   . Diabetes Brother   . Alcohol abuse Brother   . Hypertension Brother   . Alcohol abuse Brother   . Heart disease Maternal Uncle   . Breast cancer Neg Hx     SOCIAL HX:  reports that she quit smoking about 20 years ago. Her smoking use included cigarettes. She has never used smokeless tobacco. She reports current alcohol use. She reports that she does not use drugs.   Current Outpatient Medications:  .  ALPRAZolam (XANAX) 0.25 MG tablet, Take 0.5 tablets (0.125 mg  total) by mouth at bedtime as needed for sleep., Disp: 30 tablet, Rfl: 5 .  cholecalciferol (VITAMIN D3) 25 MCG (1000 UNIT) tablet, Take 1,000 Units by mouth daily., Disp: , Rfl:  .  EUTHYROX 75 MCG tablet, TAKE 1 TABLET BY MOUTH ONCE DAILY BEFORE BREAKFAST, Disp: 90 tablet, Rfl: 0 .  fexofenadine-pseudoephedrine (ALLEGRA-D) 60-120 MG 12 hr tablet, Take 1 tablet by mouth daily as needed (allergies)., Disp: , Rfl:  .  fish oil-omega-3 fatty acids 1000 MG capsule, Take 1 g by mouth daily. , Disp: , Rfl:  .  nicotine polacrilex (NICORETTE) 2 MG gum, Take 2 mg by mouth as needed for smoking cessation., Disp: , Rfl:  .  psyllium (REGULOID) 0.52 g capsule, Take 0.52 g by mouth daily., Disp: , Rfl:  .  venlafaxine XR (EFFEXOR-XR) 37.5 MG 24 hr capsule, Take 1 capsule (37.5 mg total) by mouth daily with breakfast., Disp: 90 capsule, Rfl: 1  EXAM:  VITALS per patient if applicable:  GENERAL: alert, oriented, appears well and in no acute distress  HEENT: atraumatic, conjunttiva clear, no obvious abnormalities on inspection of external nose and ears  NECK: normal movements of the head and neck  LUNGS: on inspection no signs of respiratory distress, breathing rate appears normal, no obvious gross SOB, gasping or wheezing  CV: no obvious cyanosis  MS: moves all visible extremities without noticeable abnormality  PSYCH/NEURO: pleasant and cooperative, affect flat /sad,  speech is articulate and thought processing grossly intact  ASSESSMENT AND PLAN:  Discussed the following assessment and plan:  Moderate episode of recurrent major depressive disorder (HCC)  Major depressive disorder with current active episode sympotms present for one month despite resuming psychotherapy on a weekly basis with therapist. Resuming Effexor XR starting at 37.5 mg dose .  Ok to increase to 75 mg on her own after 1-2 weeks if tolerated and if she perceives no change.  Follow up in 3-4 weeks     I discussed the  assessment and treatment plan with the patient. The patient was provided an opportunity to ask questions and all were answered. The patient agreed with the plan and demonstrated an understanding of the instructions.   The patient was advised to call back or seek an in-person evaluation if the symptoms worsen or if the condition fails to improve as anticipated.  I provided 30 minutes of face-to-face time during this encounter.   [name], MD

## 2020-09-26 NOTE — Assessment & Plan Note (Addendum)
sympotms present for one month despite resuming psychotherapy on a weekly basis with therapist. Resuming Effexor XR starting at 37.5 mg dose .  Ok to increase to 75 mg on her own after 1-2 weeks if tolerated and if she perceives no change.  Follow up in 3-4 weeks

## 2020-09-27 DIAGNOSIS — Z23 Encounter for immunization: Secondary | ICD-10-CM | POA: Diagnosis not present

## 2020-10-05 DIAGNOSIS — F418 Other specified anxiety disorders: Secondary | ICD-10-CM | POA: Diagnosis not present

## 2020-10-25 ENCOUNTER — Other Ambulatory Visit: Payer: Self-pay | Admitting: Internal Medicine

## 2020-10-25 DIAGNOSIS — Z1231 Encounter for screening mammogram for malignant neoplasm of breast: Secondary | ICD-10-CM

## 2020-11-09 DIAGNOSIS — F418 Other specified anxiety disorders: Secondary | ICD-10-CM | POA: Diagnosis not present

## 2020-11-28 ENCOUNTER — Other Ambulatory Visit: Payer: Self-pay | Admitting: Internal Medicine

## 2020-12-01 ENCOUNTER — Ambulatory Visit: Payer: Medicare Other

## 2020-12-14 DIAGNOSIS — F418 Other specified anxiety disorders: Secondary | ICD-10-CM | POA: Diagnosis not present

## 2021-01-19 ENCOUNTER — Ambulatory Visit
Admission: RE | Admit: 2021-01-19 | Discharge: 2021-01-19 | Disposition: A | Discharge status: Home / Self Care | Payer: Medicare Other | Source: Ambulatory Visit | Attending: Internal Medicine | Admitting: Internal Medicine

## 2021-01-19 ENCOUNTER — Other Ambulatory Visit: Payer: Self-pay

## 2021-01-19 DIAGNOSIS — Z1231 Encounter for screening mammogram for malignant neoplasm of breast: Secondary | ICD-10-CM | POA: Diagnosis not present

## 2021-02-08 DIAGNOSIS — F418 Other specified anxiety disorders: Secondary | ICD-10-CM | POA: Diagnosis not present

## 2021-02-09 ENCOUNTER — Ambulatory Visit (INDEPENDENT_AMBULATORY_CARE_PROVIDER_SITE_OTHER): Payer: Medicare Other

## 2021-02-09 VITALS — Ht 65.0 in | Wt 116.0 lb

## 2021-02-09 DIAGNOSIS — Z0000 Encounter for general adult medical examination without abnormal findings: Secondary | ICD-10-CM | POA: Diagnosis not present

## 2021-02-09 NOTE — Progress Notes (Addendum)
Subjective:   Alexis Mcdonald is a 70 y.o. female who presents for Medicare Annual (Subsequent) preventive examination.  Review of Systems    No ROS.  Medicare Wellness Virtual Visit.   Cardiac Risk Factors include: advanced age (>77mn, >>74women)     Objective:    Today's Vitals   02/09/21 1048  Weight: 116 lb (52.6 kg)  Height: 5' 5"  (1.651 m)   Body mass index is 19.3 kg/m.  Advanced Directives 02/09/2021 02/09/2020 02/04/2019 12/01/2018 11/24/2018 10/21/2018 07/08/2018  Does Patient Have a Medical Advance Directive? Yes Yes Yes Yes Yes Yes Yes  Type of AParamedicof AAlderLiving will HHopeLiving will HSpringvilleLiving will HVesperLiving will HPaint RockLiving will HMaxwellLiving will Living will;Healthcare Power of Attorney  Does patient want to make changes to medical advance directive? No - Patient declined No - Patient declined No - Patient declined No - Patient declined No - Patient declined - Yes (MAU/Ambulatory/Procedural Areas - Information given)  Copy of HSpartain Chart? Yes - validated most recent copy scanned in chart (See row information) Yes - validated most recent copy scanned in chart (See row information) No - copy requested No - copy requested No - copy requested - Yes    Current Medications (verified) Outpatient Encounter Medications as of 02/09/2021  Medication Sig   ALPRAZolam (XANAX) 0.25 MG tablet Take 0.5 tablets (0.125 mg total) by mouth at bedtime as needed for sleep.   cholecalciferol (VITAMIN D3) 25 MCG (1000 UNIT) tablet Take 1,000 Units by mouth daily.   EUTHYROX 75 MCG tablet TAKE 1 TABLET BY MOUTH ONCE DAILY BEFORE BREAKFAST   fexofenadine-pseudoephedrine (ALLEGRA-D) 60-120 MG 12 hr tablet Take 1 tablet by mouth daily as needed (allergies).   fish oil-omega-3 fatty acids 1000 MG capsule  Take 1 g by mouth daily.    nicotine polacrilex (NICORETTE) 2 MG gum Take 2 mg by mouth as needed for smoking cessation.   psyllium (REGULOID) 0.52 g capsule Take 0.52 g by mouth daily.   venlafaxine XR (EFFEXOR-XR) 37.5 MG 24 hr capsule Take 1 capsule (37.5 mg total) by mouth daily with breakfast.   No facility-administered encounter medications on file as of 02/09/2021.    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  Polyps 9 mm in size.   COLONOSCOPY WITH PROPOFOL N/A 10/21/2018   Procedure: COLONOSCOPY WITH PROPOFOL;  Surgeon: BRobert Bellow MD;  Location: [facility] ENDOSCOPY;  Service: Endoscopy;  Laterality: N/A;   DILATION AND CURETTAGE OF UTERUS     JOINT  REPLACEMENT  2005   done in [place]   kidney stones removal     MELANOMA EXCISION  06/29/2016   right hip replacement     SHOULDER ARTHROSCOPY WITH OPEN ROTATOR CUFF REPAIR Left 12/01/2018   Procedure: SHOULDER ARTHROSCOPY WITH OPEN ROTATOR CUFF REPAIR;  Surgeon: Corky Mull, MD;  Location: [facility] ORS;  Service: Orthopedics;  Laterality: Left;   Family History  Problem Relation Age of Onset   Diabetes Mother    Stroke Mother    Hyperlipidemia Mother    Hypertension Mother    Alcohol abuse Father     Diabetes Brother    Alcohol abuse Brother    Hypertension Brother    Alcohol abuse Brother    Heart disease Maternal Uncle    Breast cancer Neg Hx    Social History   Socioeconomic History   Marital status: Divorced    Spouse name: Not on file   Number of children: 2   Years of education: Not on file   Highest education level: Not on file  Occupational History   Occupation: Press photographer    Employer: [employer]  Tobacco Use   Smoking status: Former Smoker    Types: Cigarettes    Quit date: 09/24/2000    Years since quitting: 20.3   Smokeless tobacco: Never Used  Substance and Sexual Activity   Alcohol use: Yes    Comment: "weekends"   Drug use: No   Sexual activity: Not Currently  Other Topics Concern   Not on file  Social History Narrative   Patient lived most her her life in [place] and moved to [place] to help care for aging mother   Social Determinants of Health   Financial Resource Strain: Low Risk    Difficulty of Paying Living Expenses: Not hard at all  Food Insecurity: No Food Insecurity   Worried About Charity fundraiser in the Last Year: Never true   Arboriculturist in the Last Year: Never true  Transportation Needs: No Transportation Needs   Lack of Transportation (Medical): No   Lack of Transportation (Non-Medical): No  Physical Activity: Sufficiently Active   Days of Exercise per Week: 7 days   Minutes of Exercise per Session: 60 min  Stress: No Stress Concern Present   Feeling of Stress : Not at all  Social Connections: Unknown   Frequency of Communication with Friends and Family: More than three times a week   Frequency of Social Gatherings with Friends and Family: More than three times a week   Attends Religious Services: 1 to 4 times per year   Active Member of [employer] or Organizations: Yes   Attends Archivist Meetings: 1 to 4 times per year   Marital Status: Not on file    Tobacco Counseling Counseling given: Not  Answered   Clinical Intake:  Pre-visit preparation completed: Yes        Diabetes: No  How often do you need to have someone help you when you read instructions, pamphlets, or other written materials from your doctor or pharmacy?: 1 - Never     Activities of Daily Living In your present state of health, do you have any difficulty performing the following activities: 02/09/2021  Hearing? N  Vision? N  Difficulty concentrating or making decisions? N  Walking or climbing stairs? N  Dressing or bathing? N  Doing errands, shopping? N  Preparing Food and eating ? N  Using the Toilet? N  In  the past six months, have you accidently leaked urine? N  Do you have problems with loss of bowel control? N  Managing your Medications? N  Managing your Finances? N  Housekeeping or managing your Housekeeping? N  Some recent data might be hidden    Patient Care Team: [name], MD as PCP - General (Internal Medicine) Christene Lye, MD (General Surgery)  Indicate any recent Medical Services you may have received from other than [facility] providers in the past year (date may be approximate).     Assessment:   This is a routine wellness examination for Alexis Mcdonald.  I connected with Alexis Mcdonald today by telephone and verified that I am speaking with the correct person using two identifiers. Location patient: home Location provider: work Persons participating in the virtual visit: patient, Marine scientist.    I discussed the limitations, risks, security and privacy concerns of performing an evaluation and management service by telephone and the availability of in person appointments. The patient expressed understanding and verbally consented to this telephonic visit.    Interactive audio and video telecommunications were attempted between this provider and patient, however failed, due to patient having technical difficulties OR patient did not have access to video capability.  We continued and  completed visit with audio only.  Some vital signs may be absent or patient reported.   Hearing/Vision screen  Hearing Screening   125Hz  250Hz  500Hz  1000Hz  2000Hz  3000Hz  4000Hz  6000Hz  8000Hz   Right ear:           Left ear:           Comments: Hearing loss Mcdonald ear Tinnitus No hearing aids  Vision Screening Comments: Wears corrective lenses  Visual acuity not assessed, virtual visit. They have seen their ophthalmologist.   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 0 1 0 0 0  Number falls in past yr: 0 0 - - -  Injury with Fall? 0 0 - - -  Follow up Falls evaluation completed Falls evaluation completed Falls evaluation completed Falls evaluation completed Falls evaluation completed    [place]: Handrails in use when climbing stairs? Yes Home free of loose throw rugs in walkways, pet beds, electrical cords, etc? Yes  Adequate lighting in your home to reduce risk of falls? Yes   ASSISTIVE DEVICES UTILIZED TO PREVENT FALLS: Use of a cane, walker or w/c? No   TIMED UP AND GO: Was the test performed? No . Virtual visit.  Cognitive Function:  Patient is alert and oriented x3.  Denies difficulty focusing, making decisions, memory loss.  Enjoys socializing, teaching water classes and other brain  stimulating activities.  MMSE/6CIT deferred. Normal by direct communication/observation.    6CIT Screen 02/09/2020 02/04/2019  What Year? 0 points 0  points  What month? 0 points 0 points  What time? 0 points 0 points  Count back from 20 0 points 0 points  Months in reverse 0 points 0 points  Repeat phrase 0 points 0 points  Total Score 0 0    Immunizations Immunization History  Administered Date(s) Administered   Influenza, High Dose Seasonal PF 10/14/2019   Influenza,inj,Quad PF,6+ Mos 09/24/2014, 10/10/2015, 08/20/2016   Influenza-Unspecified 09/29/2012, 09/29/2017, 09/08/2018, 09/18/2020   PFIZER(Purple Top)SARS-COV-2 Vaccination 02/10/2020, 03/02/2020, 09/27/2020   Pneumococcal Conjugate-13 09/29/2017   Pneumococcal Polysaccharide-23 02/04/2019   Tdap 05/07/2013   Zoster 06/10/2013   Health Maintenance Health Maintenance  Topic Date Due   Hepatitis C Screening  Never done   COVID-19 Vaccine (4 - Booster for [place] series) 03/27/2021   MAMMOGRAM  01/19/2022   TETANUS/TDAP  05/08/2023   COLONOSCOPY (Pts 45-78yr Insurance coverage will need to be confirmed)  10/21/2028   INFLUENZA VACCINE  Completed   DEXA SCAN  Completed   PNA vac Low Risk Adult  Completed   Colorectal cancer screening: Type of screening: Colonoscopy. Completed 10/21/18. Repeat every 10 years  Mammogram status: Completed 01/19/21. Repeat every year  Bone density- completed 05/22/20. Osteoporosis.   Hepatitis C Screening: does qualify. Consent given.   Vision Screening: Recommended annual ophthalmology exams for early detection of glaucoma and other disorders of the eye. Is the patient up to date with their annual eye exam?  Yes  Who is the provider or what is the name of the office in which the patient attends annual eye exams? [facility]: Recommended annual dental exams for proper oral hygiene.  Community Resource Referral / Chronic Care Management: CRR required this  visit?  No   CCM required this visit?  No      Plan:   Keep all routine maintenance appointments.   Follow up 03/23/21 @ 9:30.  I have personally reviewed and noted the following in the patient's chart:   Medical and social history Use of alcohol, tobacco or illicit drugs  Current medications and supplements Functional ability and status Nutritional status Physical activity Advanced directives List of other physicians Hospitalizations, surgeries, and ER visits in previous 12 months Vitals Screenings to include cognitive, depression, and falls Referrals and appointments  In addition, I have reviewed and discussed with patient certain preventive protocols, quality metrics, and best practice recommendations. A written personalized care plan for preventive services as well as general preventive health recommendations were provided to patient via mychart.     OBrien-Blaney, Alexis Das L, LPN   26/68/1594    I have reviewed the above information and agree with above.   TDeborra Medina MD

## 2021-02-09 NOTE — Patient Instructions (Addendum)
Alexis Mcdonald , Thank you for taking time to come for your Medicare Wellness Visit. I appreciate your ongoing commitment to your health goals. Please review the following plan we discussed and let me know if I can assist you in the future.   These are the goals we discussed: Goals      Patient Stated   .  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  Hepatitis C: One time screening is recommended by Center for Disease Control  (CDC) for  adults born from [birth year] through 1966.   Never done  . COVID-19 Vaccine (4 - Booster for Pfizer series) 03/27/2021  . Mammogram  01/19/2022  . Tetanus Vaccine  05/08/2023  . Colon Cancer Screening  10/21/2028  . Flu Shot  Completed  . DEXA scan (bone density measurement)  Completed  . Pneumonia vaccines  Completed    Immunizations Immunization History  Administered Date(s) Administered  . Influenza, High Dose Seasonal PF 10/14/2019  . Influenza,inj,Quad PF,6+ Mos 09/24/2014, 10/10/2015, 08/20/2016  . Influenza-Unspecified 09/29/2012, 09/29/2017, 09/08/2018, 09/18/2020  . PFIZER(Purple Top)SARS-COV-2 Vaccination 02/10/2020, 03/02/2020, 09/27/2020  . Pneumococcal Conjugate-13 09/29/2017  . Pneumococcal Polysaccharide-23 02/04/2019  . Tdap 05/07/2013  . Zoster 06/10/2013   Keep all routine maintenance appointments.   Follow up 03/23/21 @ 9:30.  Advanced directives: on file  Conditions/risks identified: none new  Follow up in one year for your annual wellness visit.   Preventive Care 71 Years and Older, Female Preventive care refers to lifestyle choices and visits with your health care provider that can promote health and wellness. What does preventive care include?  A yearly physical exam. This is also called an annual well check.  Dental exams once or  twice a year.  Routine eye exams. Ask your health care provider how often you should have your eyes checked.  Personal lifestyle choices, including:  Daily care of your teeth and gums.  Regular physical activity.  Eating a healthy diet.  Avoiding tobacco and drug use.  Limiting alcohol use.  Practicing safe sex.  Taking low-dose aspirin every day.  Taking vitamin and mineral supplements as recommended by your health care provider. What happens during an annual well check? The services and screenings done by your health care provider during your annual well check will depend on your age, overall health, lifestyle risk factors, and family history of disease. Counseling  Your health care provider may ask you questions about your:  Alcohol use.  Tobacco use.  Drug use.  Emotional well-being.  Home and relationship well-being.  Sexual activity.  Eating habits.  History of falls.  Memory and ability to understand (cognition).  Work and work Statistician.  Reproductive health. Screening  You may have the following tests or measurements:  Height, weight, and BMI.  Blood pressure.  Lipid and cholesterol levels. These may be checked every 5 years, or more frequently if you are over 53 years old.  Skin check.  Lung cancer screening. You may have this screening every year starting at age 19 if you have a 30-pack-year history of smoking and currently smoke or have quit within the past 15 years.  Fecal occult blood test (FOBT) of the stool. You may have this test every year starting at age 69.  Flexible sigmoidoscopy or colonoscopy. You  may have a sigmoidoscopy every 5 years or a colonoscopy every 10 years starting at age 46.  Hepatitis C blood test.  Hepatitis B blood test.  Sexually transmitted disease (STD) testing.  Diabetes screening. This is done by checking your blood sugar (glucose) after you have not eaten for a while (fasting). You may have this done  every 1-3 years.  Bone density scan. This is done to screen for osteoporosis. You may have this done starting at age 48.  Mammogram. This may be done every 1-2 years. Talk to your health care provider about how often you should have regular mammograms. Talk with your health care provider about your test results, treatment options, and if necessary, the need for more tests. Vaccines  Your health care provider may recommend certain vaccines, such as:  Influenza vaccine. This is recommended every year.  Tetanus, diphtheria, and acellular pertussis (Tdap, Td) vaccine. You may need a Td booster every 10 years.  Zoster vaccine. You may need this after age 57.  Pneumococcal 13-valent conjugate (PCV13) vaccine. One dose is recommended after age 33.  Pneumococcal polysaccharide (PPSV23) vaccine. One dose is recommended after age 32. Talk to your health care provider about which screenings and vaccines you need and how often you need them. This information is not intended to replace advice given to you by your health care provider. Make sure you discuss any questions you have with your health care provider. Document Released: 01/12/2016 Document Revised: 09/04/2016 Document Reviewed: 10/17/2015 Elsevier Interactive Patient Education  2017 [place] Prevention in the Home Falls can cause injuries. They can happen to people of all ages. There are many things you can do to make your home safe and to help prevent falls. What can I do on the outside of my home?  Regularly fix the edges of walkways and driveways and fix any cracks.  Remove anything that might make you trip as you walk through a door, such as a raised step or threshold.  Trim any bushes or trees on the path to your home.  Use bright outdoor lighting.  Clear any walking paths of anything that might make someone trip, such as rocks or tools.  Regularly check to see if handrails are loose or broken. Make sure that both  sides of any steps have handrails.  Any raised decks and porches should have guardrails on the edges.  Have any leaves, snow, or ice cleared regularly.  Use sand or salt on walking paths during winter.  Clean up any spills in your garage right away. This includes oil or grease spills. What can I do in the bathroom?  Use night lights.  Install grab bars by the toilet and in the tub and shower. Do not use towel bars as grab bars.  Use non-skid mats or decals in the tub or shower.  If you need to sit down in the shower, use a plastic, non-slip stool.  Keep the floor dry. Clean up any water that spills on the floor as soon as it happens.  Remove soap buildup in the tub or shower regularly.  Attach bath mats securely with double-sided non-slip rug tape.  Do not have throw rugs and other things on the floor that can make you trip. What can I do in the bedroom?  Use night lights.  Make sure that you have a light by your bed that is easy to reach.  Do not use any sheets or blankets that are too big for  your bed. They should not hang down onto the floor.  Have a firm chair that has side arms. You can use this for support while you get dressed.  Do not have throw rugs and other things on the floor that can make you trip. What can I do in the kitchen?  Clean up any spills right away.  Avoid walking on wet floors.  Keep items that you use a lot in easy-to-reach places.  If you need to reach something above you, use a strong step stool that has a grab bar.  Keep electrical cords out of the way.  Do not use floor polish or wax that makes floors slippery. If you must use wax, use non-skid floor wax.  Do not have throw rugs and other things on the floor that can make you trip. What can I do with my stairs?  Do not leave any items on the stairs.  Make sure that there are handrails on both sides of the stairs and use them. Fix handrails that are broken or loose. Make sure that  handrails are as long as the stairways.  Check any carpeting to make sure that it is firmly attached to the stairs. Fix any carpet that is loose or worn.  Avoid having throw rugs at the top or bottom of the stairs. If you do have throw rugs, attach them to the floor with carpet tape.  Make sure that you have a light switch at the top of the stairs and the bottom of the stairs. If you do not have them, ask someone to add them for you. What else can I do to help prevent falls?  Wear shoes that:  Do not have high heels.  Have rubber bottoms.  Are comfortable and fit you well.  Are closed at the toe. Do not wear sandals.  If you use a stepladder:  Make sure that it is fully opened. Do not climb a closed stepladder.  Make sure that both sides of the stepladder are locked into place.  Ask someone to hold it for you, if possible.  Clearly mark and make sure that you can see:  Any grab bars or handrails.  First and last steps.  Where the edge of each step is.  Use tools that help you move around (mobility aids) if they are needed. These include:  Canes.  Walkers.  Scooters.  Crutches.  Turn on the lights when you go into a dark area. Replace any light bulbs as soon as they burn out.  Set up your furniture so you have a clear path. Avoid moving your furniture around.  If any of your floors are uneven, fix them.  If there are any pets around you, be aware of where they are.  Review your medicines with your doctor. Some medicines can make you feel dizzy. This can increase your chance of falling. Ask your doctor what other things that you can do to help prevent falls. This information is not intended to replace advice given to you by your health care provider. Make sure you discuss any questions you have with your health care provider. Document Released: 10/12/2009 Document Revised: 05/23/2016 Document Reviewed: 01/20/2015 Elsevier Interactive Patient Education  2017  [employer].

## 2021-02-10 ENCOUNTER — Other Ambulatory Visit: Payer: Self-pay | Admitting: Internal Medicine

## 2021-03-08 DIAGNOSIS — F418 Other specified anxiety disorders: Secondary | ICD-10-CM | POA: Diagnosis not present

## 2021-03-23 ENCOUNTER — Other Ambulatory Visit: Payer: Self-pay

## 2021-03-23 ENCOUNTER — Ambulatory Visit (INDEPENDENT_AMBULATORY_CARE_PROVIDER_SITE_OTHER): Payer: Medicare Other | Admitting: Internal Medicine

## 2021-03-23 ENCOUNTER — Encounter: Payer: Self-pay | Admitting: Internal Medicine

## 2021-03-23 VITALS — BP 120/68 | HR 51 | Temp 97.5°F | Ht 65.0 in | Wt 114.6 lb

## 2021-03-23 DIAGNOSIS — D696 Thrombocytopenia, unspecified: Secondary | ICD-10-CM | POA: Diagnosis not present

## 2021-03-23 DIAGNOSIS — D702 Other drug-induced agranulocytosis: Secondary | ICD-10-CM

## 2021-03-23 DIAGNOSIS — F331 Major depressive disorder, recurrent, moderate: Secondary | ICD-10-CM | POA: Diagnosis not present

## 2021-03-23 DIAGNOSIS — E890 Postprocedural hypothyroidism: Secondary | ICD-10-CM | POA: Diagnosis not present

## 2021-03-23 DIAGNOSIS — R5383 Other fatigue: Secondary | ICD-10-CM

## 2021-03-23 DIAGNOSIS — Z23 Encounter for immunization: Secondary | ICD-10-CM

## 2021-03-23 DIAGNOSIS — M7061 Trochanteric bursitis, right hip: Secondary | ICD-10-CM

## 2021-03-23 DIAGNOSIS — Z9889 Other specified postprocedural states: Secondary | ICD-10-CM | POA: Diagnosis not present

## 2021-03-23 DIAGNOSIS — E782 Mixed hyperlipidemia: Secondary | ICD-10-CM

## 2021-03-23 DIAGNOSIS — E559 Vitamin D deficiency, unspecified: Secondary | ICD-10-CM | POA: Diagnosis not present

## 2021-03-23 DIAGNOSIS — Z96641 Presence of right artificial hip joint: Secondary | ICD-10-CM

## 2021-03-23 LAB — COMPREHENSIVE METABOLIC PANEL
ALT: 12 U/L (ref 0–35)
AST: 17 U/L (ref 0–37)
Albumin: 4.6 g/dL (ref 3.5–5.2)
Alkaline Phosphatase: 67 U/L (ref 39–117)
BUN: 26 mg/dL — ABNORMAL HIGH (ref 6–23)
CO2: 28 mEq/L (ref 19–32)
Calcium: 8.9 mg/dL (ref 8.4–10.5)
Chloride: 105 mEq/L (ref 96–112)
Creatinine, Ser: 0.71 mg/dL (ref 0.40–1.20)
GFR: 86.06 mL/min (ref >60.00)
Glucose, Bld: 99 mg/dL (ref 70–99)
Potassium: 4.5 mEq/L (ref 3.5–5.1)
Sodium: 139 mEq/L (ref 135–145)
Total Bilirubin: 0.5 mg/dL (ref 0.2–1.2)
Total Protein: 6.5 g/dL (ref 6.0–8.3)

## 2021-03-23 LAB — LIPID PANEL
Cholesterol: 208 mg/dL — ABNORMAL HIGH (ref 0–200)
HDL: 94.6 mg/dL (ref >39.00)
LDL Cholesterol: 106 mg/dL — ABNORMAL HIGH (ref 0–99)
NonHDL: 113.61
Total CHOL/HDL Ratio: 2
Triglycerides: 40 mg/dL (ref 0.0–149.0)
VLDL: 8 mg/dL (ref 0.0–40.0)

## 2021-03-23 LAB — CBC WITH DIFFERENTIAL/PLATELET
Basophils Absolute: 0 10*3/uL (ref 0.0–0.1)
Basophils Relative: 0.7 % (ref 0.0–3.0)
Eosinophils Absolute: 0 10*3/uL (ref 0.0–0.7)
Eosinophils Relative: 0.7 % (ref 0.0–5.0)
HCT: 40.2 % (ref 36.0–46.0)
Hemoglobin: 13.4 g/dL (ref 12.0–15.0)
Lymphocytes Relative: 39.9 % (ref 12.0–46.0)
Lymphs Abs: 1.2 10*3/uL (ref 0.7–4.0)
MCHC: 33.4 g/dL (ref 30.0–36.0)
MCV: 94.8 fl (ref 78.0–100.0)
Monocytes Absolute: 0.3 10*3/uL (ref 0.1–1.0)
Monocytes Relative: 8.5 % (ref 3.0–12.0)
Neutro Abs: 1.6 10*3/uL (ref 1.4–7.7)
Neutrophils Relative %: 50.2 % (ref 43.0–77.0)
Platelets: 141 10*3/uL — ABNORMAL LOW (ref 150.0–400.0)
RBC: 4.24 Mil/uL (ref 3.87–5.11)
RDW: 13.1 % (ref 11.5–15.5)
WBC: 3.1 10*3/uL — ABNORMAL LOW (ref 4.0–10.5)

## 2021-03-23 LAB — VITAMIN B12: Vitamin B-12: 769 pg/mL (ref 211–911)

## 2021-03-23 LAB — VITAMIN D 25 HYDROXY (VIT D DEFICIENCY, FRACTURES): VITD: 49.5 ng/mL (ref 30.00–100.00)

## 2021-03-23 LAB — TSH: TSH: 4.85 u[IU]/mL — ABNORMAL HIGH (ref 0.35–4.50)

## 2021-03-23 MED ORDER — PREDNISONE 10 MG PO TABS: ORAL_TABLET | ORAL | 0 refills | Status: DC

## 2021-03-23 MED ORDER — ZOSTER VAC RECOMB ADJUVANTED 50 MCG/0.5ML IM SUSR: 0.5000 mL | Freq: Once | INTRAMUSCULAR | 1 refills | Status: AC

## 2021-03-23 NOTE — Progress Notes (Signed)
Subjective:  Patient ID: Alexis Mcdonald, female    DOB: Dec 21, 1951  Age: 70 y.o. MRN: [id number]  CC: The primary encounter diagnosis was S/P shoulder surgery. Diagnoses of History of total right hip arthroplasty, Postoperative hypothyroidism, Other drug-induced neutropenia ([place]), Vitamin D deficiency, Fatigue, unspecified type, Moderate mixed hyperlipidemia not requiring statin therapy, Need for pneumococcal vaccination, Trochanteric bursitis, right hip, Moderate episode of recurrent major depressive disorder ([place]), Drug-induced neutropenia ([place]), and Thrombocytopenia ([place]) were also pertinent to this visit.  HPI Alexis Mcdonald presents for follow up on depression  She was prescribed Effexor at her previous visit but did not start the medication.  She continues to meet with her lifelong therapist to address her emotional dysfunction.  She is particularly bothered by the fact that she is not part of her son's life,  Since he moved to [place]  Several years ago with his wife and children .  She has one daughter in [place] with a new baby. 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  Does 7 classes in 4 days   Osteoporiis:  Has deferred therapy,  Had a fall recently onto a hard rock.  While hiking,  No fractures  But right knee hematoma   Left rotator cuff repair acting up.  Seeing [facility]  Some OA from prior right hip replacement.  Hip hurting with walking and with direct pressure.   Outpatient Medications Prior to Visit  Medication Sig Dispense Refill  . ALPRAZolam (XANAX) 0.25 MG tablet Take 0.5 tablets (0.125 mg total) by mouth at bedtime as needed for sleep. 30 tablet 5  . cholecalciferol (VITAMIN D3) 25 MCG (1000 UNIT) tablet Take 1,000 Units by mouth daily.    . fexofenadine-pseudoephedrine (ALLEGRA-D) 60-120 MG 12 hr tablet Take 1 tablet by mouth daily as needed (allergies).    . fish oil-omega-3 fatty  acids 1000 MG capsule Take 1 g by mouth daily.     . nicotine polacrilex (NICORETTE) 2 MG gum Take 2 mg by mouth as needed for smoking cessation.    . psyllium (REGULOID) 0.52 g capsule Take 0.52 g by mouth daily.    Marland Kitchen venlafaxine XR (EFFEXOR-XR) 37.5 MG 24 hr capsule Take 1 capsule (37.5 mg total) by mouth daily with breakfast. 90 capsule 1  . EUTHYROX 75 MCG tablet TAKE 1 TABLET BY MOUTH ONCE DAILY BEFORE BREAKFAST 90 tablet 0   No facility-administered medications prior to visit.    Review of Systems;  Patient denies headache, fevers, malaise, unintentional weight loss, skin rash, eye pain, sinus congestion and sinus pain, sore throat, dysphagia,  hemoptysis , cough, dyspnea, wheezing, chest pain, palpitations, orthopnea, edema, abdominal pain, nausea, melena, diarrhea, constipation, flank pain, dysuria, hematuria, urinary  Frequency, nocturia, numbness, tingling, seizures,  Focal weakness, Loss of consciousness,  Tremor, insomnia, depression, anxiety, and suicidal ideation.      Objective:  BP 120/68   Pulse (!) 51   Temp (!) 97.5 F (36.4 C) (Oral)   Ht 5\' 5"  (1.651 m)   Wt 114 lb 9.6 oz (52 kg)   SpO2 97%   BMI 19.07 kg/m   BP Readings from Last 3 Encounters:  03/23/21 120/68  06/20/20 132/76  03/09/19 112/70    Wt Readings from Last 3 Encounters:  03/23/21 114 lb 9.6 oz (52 kg)  02/09/21 116 lb (52.6 kg)  09/26/20 116 lb 3.2 oz (52.7 kg)    General appearance: alert, cooperative  and appears stated age Ears: normal TM's and external ear canals both ears Throat: lips, mucosa, and tongue normal; teeth and gums normal Neck: no adenopathy, no carotid bruit, supple, symmetrical, trachea midline and thyroid not enlarged, symmetric, no tenderness/mass/nodules Back: symmetric, no curvature. ROM normal. No CVA tenderness. Lungs: clear to auscultation bilaterally Heart: regular rate and rhythm, S1, S2 normal, no murmur, click, rub or gallop Abdomen: soft, non-tender; bowel  sounds normal; no masses,  no organomegaly Pulses: 2+ and symmetric Skin: Skin color, texture, turgor normal. No rashes or lesions Lymph nodes: Cervical, supraclavicular, and axillary nodes normal. Psych: affect normal, makes good eye contact. No fidgeting,  Smiles easily.  Denies suicidal thoughts   No results found for: HGBA1C  Lab Results  Component Value Date   CREATININE 0.71 03/23/2021   CREATININE 0.71 11/03/2018   CREATININE 0.62 05/26/2018    Lab Results  Component Value Date   WBC 3.1 (L) 03/23/2021   HGB 13.4 03/23/2021   HCT 40.2 03/23/2021   PLT 141.0 (L) 03/23/2021   GLUCOSE 99 03/23/2021   CHOL 208 (H) 03/23/2021   TRIG 40.0 03/23/2021   HDL 94.60 03/23/2021   LDLCALC 106 (H) 03/23/2021   ALT 12 03/23/2021   AST 17 03/23/2021   NA 139 03/23/2021   K 4.5 03/23/2021   CL 105 03/23/2021   CREATININE 0.71 03/23/2021   BUN 26 (H) 03/23/2021   CO2 28 03/23/2021   TSH 4.85 (H) 03/23/2021    MM 3D SCREEN BREAST BILATERAL  Result Date: 01/19/2021 CLINICAL DATA:  Screening. EXAM: DIGITAL SCREENING BILATERAL MAMMOGRAM WITH TOMO AND CAD COMPARISON:  Previous exam(s). ACR Breast Density Category c: The breast tissue is heterogeneously dense, which may obscure small masses. FINDINGS: There are no findings suspicious for malignancy. The images were evaluated with computer-aided detection. IMPRESSION: No mammographic evidence of malignancy. A result letter of this screening mammogram will be mailed directly to the patient. RECOMMENDATION: Screening mammogram in one year. (Code:SM-B-01Y) BI-RADS CATEGORY  1: Negative. Electronically Signed   By: Franki Cabot M.D.   On: 01/19/2021 14:52    Assessment & Plan:   Problem List Items Addressed This Visit      Unprioritized   Thrombocytopenia ([place])    Recurrent, mild   No recent heme/onc follow up for BM disorder managed previously by Dr Ma Hillock.  Referral in process       RESOLVED: Drug-induced neutropenia ([facility])    Neutropenia ([facility])    Prior hematologic workup in 2019 was unrevealing,  But she has developed  a mild thrombocytopenia again as well.  Will have her follow up with hematology          Relevant Orders   Vitamin B12 (Completed)   CBC with Differential/Platelet (Completed)   Hypothyroidism    Thyroid slightly underactive on 75 mcg of Euthyrox.  Given her depression,  Will increase dose to 88 mcg .   Lab Results  Component Value Date   TSH 4.85 (H) 03/23/2021         Relevant Medications   levothyroxine (EUTHYROX) 88 MCG tablet   Other Relevant Orders   TSH (Completed)   TSH   Major depressive disorder with current active episode    No improvement without medication.  Effexor start discussed       Trochanteric bursitis, right hip    Steroid taper outlined,  If no improvement [facility] referral is in progress.       History of total right hip arthroplasty (  Chronic)   Relevant Orders   Ambulatory referral to Orthopedic Surgery   S/P shoulder surgery - Primary   Relevant Orders   Ambulatory referral to Orthopedic Surgery    Other Visit Diagnoses    Vitamin D deficiency       Relevant Orders   VITAMIN D 25 Hydroxy (Vit-D Deficiency, Fractures) (Completed)   Fatigue, unspecified type       Relevant Orders   Comprehensive metabolic panel (Completed)   Moderate mixed hyperlipidemia not requiring statin therapy       Relevant Orders   Lipid panel (Completed)   Need for pneumococcal vaccination       Relevant Orders   Pneumococcal conjugate vaccine 20-valent (Prevnar 20) (Completed)      I have changed Alexis Celeste A. Boylan-Donnelly "Patty"'s Euthyrox to levothyroxine. I am also having her start on predniSONE and Zoster Vaccine Adjuvanted. Additionally, I am having her maintain her fish oil-omega-3 fatty acids, psyllium, fexofenadine-pseudoephedrine, ALPRAZolam, cholecalciferol, nicotine polacrilex, and venlafaxine XR.  Meds ordered this encounter  Medications  .  predniSONE (DELTASONE) 10 MG tablet    Sig: 6 tablets daily for 3 days, then reduce by 1 tablet daily until gone    Dispense:  33 tablet    Refill:  0  . Zoster Vaccine Adjuvanted [facility]) injection    Sig: Inject 0.5 mLs into the muscle once for 1 dose.    Dispense:  1 each    Refill:  1  . levothyroxine (EUTHYROX) 88 MCG tablet    Sig: Take 1 tablet (88 mcg total) by mouth daily before breakfast.    Dispense:  90 tablet    Refill:  1    Medications Discontinued During This Encounter  Medication Reason  . EUTHYROX 75 MCG tablet     Follow-up: No follow-ups on file.   [name], MD

## 2021-03-23 NOTE — Patient Instructions (Addendum)
Start the prednisone on Wednesday.  60 mg (6 tablets) every morning for 3 days,  Then reduce dose each day by 10 mg (1 tablet):  60  60  60  50  40  30  20  10    Mg        Or:     6   6   6   5   4   3   2   1    Tablet     If you feel very "amped"  You can skip Day 2 and 3 of 6 0 mg and jump right to 50 mg   You can add tylenol but no motrin or aleve while you are on the prednisone  You can start the effexor initially one capsule every other day for 4 doses;  If tolerated,  Then increase to once daily for 2 weeks,  Then increase if needed to 2 capsules daily (together)      The ShingRx vaccine is now available in local pharmacies and is much more protective than the old one  Zostavax  (it is about 97%  Effective in preventing shingles). .   It is therefore ADVISED for all interested adults over 50 to prevent shingles so I have printed you a prescription for it.  (it requires a 2nd dose 2 too 6 months after the first one) .  It will cause you to have flu  like symptoms for 2 days

## 2021-03-25 DIAGNOSIS — M7061 Trochanteric bursitis, right hip: Secondary | ICD-10-CM | POA: Insufficient documentation

## 2021-03-25 MED ORDER — LEVOTHYROXINE SODIUM 88 MCG PO TABS: 88.0000 ug | ORAL_TABLET | Freq: Every day | ORAL | 1 refills | Status: DC

## 2021-03-25 NOTE — Assessment & Plan Note (Signed)
Steroid taper outlined,  If no improvement [facility] referral is in progress.

## 2021-03-25 NOTE — Assessment & Plan Note (Signed)
No improvement without medication.  Effexor start discussed

## 2021-03-25 NOTE — Assessment & Plan Note (Signed)
Prior hematologic workup in 2019 was unrevealing,  But she has developed  a mild thrombocytopenia again as well.  Will have her follow up with hematology

## 2021-03-25 NOTE — Assessment & Plan Note (Deleted)
Prior hematologic workup in 2019 was unrevealing,  But she has developed  a mild thrombocytopenia as well.  Will have her follow up with hematology

## 2021-03-25 NOTE — Assessment & Plan Note (Signed)
Recurrent, mild   No recent heme/onc follow up for BM disorder managed previously by Dr Ma Hillock.  Referral in process

## 2021-03-25 NOTE — Assessment & Plan Note (Addendum)
Thyroid slightly underactive on 75 mcg of Euthyrox.  Given her depression,  Will increase dose to 88 mcg .   Lab Results  Component Value Date   TSH 4.85 (H) 03/23/2021

## 2021-03-26 ENCOUNTER — Telehealth: Payer: Self-pay | Admitting: Internal Medicine

## 2021-03-26 DIAGNOSIS — D702 Other drug-induced agranulocytosis: Secondary | ICD-10-CM

## 2021-03-26 DIAGNOSIS — D696 Thrombocytopenia, unspecified: Secondary | ICD-10-CM

## 2021-03-26 NOTE — Telephone Encounter (Signed)
Left message for patient to return call back and or send a mychart.Marland Kitchen

## 2021-03-26 NOTE — Telephone Encounter (Signed)
Patient called in about her last visit wanted a call back have some questions

## 2021-03-27 NOTE — Telephone Encounter (Signed)
Referral in progress Per patient agreement

## 2021-03-27 NOTE — Telephone Encounter (Signed)
Patient called and would like to change the message to; please let Dr. Derrel Nip know that she will see Dr. Dorann Ou. Patient's MyChart is currently not working. Please call her if you have any questions.

## 2021-03-28 NOTE — Telephone Encounter (Signed)
Referral received and sent. °

## 2021-04-09 ENCOUNTER — Other Ambulatory Visit: Payer: Self-pay

## 2021-04-09 ENCOUNTER — Inpatient Hospital Stay: Payer: Medicare Other | Attending: Internal Medicine | Admitting: Internal Medicine

## 2021-04-09 ENCOUNTER — Inpatient Hospital Stay: Payer: Medicare Other

## 2021-04-09 ENCOUNTER — Encounter: Payer: Self-pay | Admitting: Internal Medicine

## 2021-04-09 VITALS — BP 141/83 | HR 63 | Temp 97.8°F | Resp 20 | Wt 114.3 lb

## 2021-04-09 DIAGNOSIS — D696 Thrombocytopenia, unspecified: Secondary | ICD-10-CM

## 2021-04-09 DIAGNOSIS — D709 Neutropenia, unspecified: Secondary | ICD-10-CM | POA: Diagnosis not present

## 2021-04-09 LAB — COMPREHENSIVE METABOLIC PANEL
ALT: 20 U/L (ref 0–44)
AST: 23 U/L (ref 15–41)
Albumin: 4.5 g/dL (ref 3.5–5.0)
Alkaline Phosphatase: 66 U/L (ref 38–126)
Anion gap: 7 (ref 5–15)
BUN: 33 mg/dL — ABNORMAL HIGH (ref 8–23)
CO2: 26 mmol/L (ref 22–32)
Calcium: 9.2 mg/dL (ref 8.9–10.3)
Chloride: 104 mmol/L (ref 98–111)
Creatinine, Ser: 0.65 mg/dL (ref 0.44–1.00)
GFR, Estimated: >60 mL/min (ref >60)
Glucose, Bld: 105 mg/dL — ABNORMAL HIGH (ref 70–99)
Potassium: 4.6 mmol/L (ref 3.5–5.1)
Sodium: 137 mmol/L (ref 135–145)
Total Bilirubin: 0.7 mg/dL (ref 0.3–1.2)
Total Protein: 6.8 g/dL (ref 6.5–8.1)

## 2021-04-09 LAB — CBC WITH DIFFERENTIAL/PLATELET
Abs Immature Granulocytes: 0.03 10*3/uL (ref 0.00–0.07)
Basophils Absolute: 0 10*3/uL (ref 0.0–0.1)
Basophils Relative: 0 %
Eosinophils Absolute: 0 10*3/uL (ref 0.0–0.5)
Eosinophils Relative: 0 %
HCT: 41.7 % (ref 36.0–46.0)
Hemoglobin: 14 g/dL (ref 12.0–15.0)
Immature Granulocytes: 1 %
Lymphocytes Relative: 26 %
Lymphs Abs: 1.6 10*3/uL (ref 0.7–4.0)
MCH: 32 pg (ref 26.0–34.0)
MCHC: 33.6 g/dL (ref 30.0–36.0)
MCV: 95.4 fL (ref 80.0–100.0)
Monocytes Absolute: 0.5 10*3/uL (ref 0.1–1.0)
Monocytes Relative: 7 %
Neutro Abs: 4.2 10*3/uL (ref 1.7–7.7)
Neutrophils Relative %: 66 %
Platelets: 161 10*3/uL (ref 150–400)
RBC: 4.37 MIL/uL (ref 3.87–5.11)
RDW: 13 % (ref 11.5–15.5)
WBC: 6.4 10*3/uL (ref 4.0–10.5)
nRBC: 0 % (ref 0.0–0.2)

## 2021-04-09 LAB — TECHNOLOGIST SMEAR REVIEW
Plt Morphology: NORMAL
RBC Morphology: NORMAL
WBC Morphology: NORMAL

## 2021-04-09 LAB — LACTATE DEHYDROGENASE: LDH: 164 U/L (ref 98–192)

## 2021-04-09 NOTE — Progress Notes (Signed)
[facility] NOTE  Patient Care Team: [name], MD as PCP - General (Internal Medicine) Christene Lye, MD (General Surgery)  CHIEF COMPLAINTS/PURPOSE OF CONSULTATION: Thrombocytopenia   HEMATOLOGY HISTORY # LEUKOPENIA/NEUTROPENIA/Mild intermittent thrombocytopenia [120-130s] -Asymtomatic/ incidental 2012 [facility]- [Dr.Yabenez/Dr.Pandit];ANC- 0.9;  mild nonspecific dyspoiesis; hypocellular marrow for age 70 to 35% with trilineage hematopoiesis; karyotype/SNP- Normal. HIV/hep B/C- NEG.   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   #  2019- Elevated "heavy metals- [place]" [? S/p leaching from hip implant; 2000 [place]]   HISTORY OF PRESENTING ILLNESS:  Alexis Mcdonald 70 y.o.  female pleasant patient was been referred to [place] for further evaluation of thrombocytopenia/intermittent neutropenia.  Patient has been previously been evaluated for above etiology-see summary above.  Patient denies any frequent infections.  Denies any gum bleeding or nosebleeds.  Denies any hospitalizations no weight loss.    Review of Systems  Constitutional: Negative for chills, diaphoresis, fever, malaise/fatigue and weight loss.  HENT: Negative for nosebleeds and sore throat.   Eyes: Negative for double vision.  Respiratory: Negative for cough, hemoptysis, sputum production, shortness of breath and wheezing.   Cardiovascular: Negative for chest pain, palpitations, orthopnea and leg swelling.  Gastrointestinal: Negative for abdominal pain, blood in stool, constipation, diarrhea, heartburn, melena, nausea and vomiting.  Genitourinary: Negative for dysuria, frequency and urgency.  Musculoskeletal: Negative for back pain and joint pain.  Skin: Negative.  Negative for itching and rash.  Neurological: Negative for dizziness, tingling, focal weakness, weakness and headaches.   Endo/Heme/Allergies: Does not bruise/bleed easily.  Psychiatric/Behavioral: Negative for depression. The patient is not nervous/anxious and does not have insomnia.      MEDICAL HISTORY:  Past Medical History:  Diagnosis Date  . Borderline osteopenia    prior DEXA at [facility]   . Cancer ([place])    Melanoma removed from right hip  . Colon polyp 2009   next one due 2012  . 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 History of kidney stones   . Hypothyroidism   . Kidney stones   . Neutropenia    no infections,  bone marrow biopsy by [name] next week  . renal calculi    s/p lithotripsy, [place]  . Thyroid disease    hypothyroidism    SURGICAL HISTORY: Past Surgical History:  Procedure Laterality Date  . COLONOSCOPY  03/23/2010   Diverticulosis  . COLONOSCOPY W/ BIOPSIES  03/14/2006   Tubular adenoma of the rectum x2, upper lesion with focal high-grade dysplasia.  Polyps 9 mm in size.  Marland Kitchen COLONOSCOPY WITH PROPOFOL N/A 10/21/2018   Procedure: COLONOSCOPY WITH PROPOFOL;  Surgeon: Robert Bellow, MD;  Location: [facility] ENDOSCOPY;  Service: Endoscopy;  Laterality: N/A;  . DILATION AND CURETTAGE OF UTERUS    . JOINT REPLACEMENT  2005   done in [place]  . kidney stones removal    . MELANOMA EXCISION  06/29/2016  . right hip replacement    . SHOULDER ARTHROSCOPY WITH OPEN ROTATOR CUFF REPAIR Left 12/01/2018   Procedure: SHOULDER ARTHROSCOPY WITH OPEN ROTATOR CUFF REPAIR;  Surgeon: Corky Mull, MD;  Location: [facility] ORS;  Service: Orthopedics;  Laterality: Left;    SOCIAL HISTORY: Social History   Socioeconomic History  . Marital status: Divorced    Spouse name: Not on file  . Number of children: 2  . Years of education: Not on file  .  Highest education level: Not on file  Occupational History  . Occupation: Therapist, nutritional: [employer]  Tobacco Use  . Smoking status: Former Smoker    Types: Cigarettes    Quit date:  09/24/2000    Years since quitting: 20.5  . Smokeless tobacco: Never Used  Vaping Use  . Vaping Use: Never used  Substance and Sexual Activity  . Alcohol use: Yes    Comment: "weekends"  . Drug use: No  . Sexual activity: Yes    Birth control/protection: None  Other Topics Concern  . Not on file  Social History Narrative   Patient lived most her her life in [place] and moved to [place] to help care for aging mother   Social Determinants of Health   Financial Resource Strain: Low Risk   . Difficulty of Paying Living Expenses: Not hard at all  Food Insecurity: No Food Insecurity  . Worried About Charity fundraiser in the Last Year: Never true  . Ran Out of Food in the Last Year: Never true  Transportation Needs: No Transportation Needs  . Lack of Transportation (Medical): No  . Lack of Transportation (Non-Medical): No  Physical Activity: Sufficiently Active  . Days of Exercise per Week: 7 days  . Minutes of Exercise per Session: 60 min  Stress: No Stress Concern Present  . Feeling of Stress : Not at all  Social Connections: Unknown  . Frequency of Communication with Friends and Family: More than three times a week  . Frequency of Social Gatherings with Friends and Family: More than three times a week  . Attends Religious Services: 1 to 4 times per year  . Active Member of Clubs or Organizations: Yes  . Attends Archivist Meetings: 1 to 4 times per year  . Marital Status: Not on file  Intimate Partner Violence: Not At Risk  . Fear of Current or Ex-Partner: No  . Emotionally Abused: No  . Physically Abused: No  . Sexually Abused: No    FAMILY HISTORY: Family History  Problem Relation Age of Onset  . Diabetes Mother   . Stroke Mother   . Hyperlipidemia Mother   . Hypertension Mother   . Alcohol abuse Father   . Diabetes Brother   . Alcohol abuse Brother   . Hypertension Brother   . Alcohol abuse Brother   . Heart disease Maternal Uncle   . Breast cancer Neg Hx      ALLERGIES:  is allergic to penicillins, butenafine, and terbinafine and related.  MEDICATIONS:  Current Outpatient Medications  Medication Sig Dispense Refill  . ALPRAZolam (XANAX) 0.25 MG tablet Take 0.5 tablets (0.125 mg total) by mouth at bedtime as needed for sleep. 30 tablet 5  . cholecalciferol (VITAMIN D3) 25 MCG (1000 UNIT) tablet Take 1,000 Units by mouth daily.    . fexofenadine-pseudoephedrine (ALLEGRA-D) 60-120 MG 12 hr tablet Take 1 tablet by mouth daily as needed (allergies).    . fish oil-omega-3 fatty acids 1000 MG capsule Take 1 g by mouth daily.     Marland Kitchen levothyroxine (EUTHYROX) 88 MCG tablet Take 1 tablet (88 mcg total) by mouth daily before breakfast. 90 tablet 1  . nicotine polacrilex (NICORETTE) 2 MG gum Take 2 mg by mouth as needed for smoking cessation.    . psyllium (REGULOID) 0.52 g capsule Take 0.52 g by mouth daily.    . predniSONE (DELTASONE) 10 MG tablet 6 tablets daily for 3 days, then reduce by  1 tablet daily until gone 33 tablet 0  . venlafaxine XR (EFFEXOR-XR) 37.5 MG 24 hr capsule Take 1 capsule (37.5 mg total) by mouth daily with breakfast. 90 capsule 1   No current facility-administered medications for this visit.     Marland Kitchen  PHYSICAL EXAMINATION:   Vitals:   04/09/21 1402  BP: (!) 141/83  Pulse: 63  Resp: 20  Temp: 97.8 F (36.6 C)  SpO2: 100%   Filed Weights   04/09/21 1402  Weight: 114 lb 4.8 oz (51.8 kg)    Physical Exam HENT:     Head: Normocephalic and atraumatic.     Mouth/Throat:     Pharynx: No oropharyngeal exudate.  Eyes:     Pupils: Pupils are equal, round, and reactive to light.  Cardiovascular:     Rate and Rhythm: Normal rate and regular rhythm.  Pulmonary:     Effort: Pulmonary effort is normal. No respiratory distress.     Breath sounds: Normal breath sounds. No wheezing.  Abdominal:     General: Bowel sounds are normal. There is no distension.     Palpations: Abdomen is soft. There is no mass.     Tenderness:  There is no abdominal tenderness. There is no guarding or rebound.  Musculoskeletal:        General: No tenderness. Normal range of motion.     Cervical back: Normal range of motion and neck supple.  Skin:    General: Skin is warm.  Neurological:     Mental Status: She is alert and oriented to person, place, and time.  Psychiatric:        Mood and Affect: Affect normal.      LABORATORY DATA:  I have reviewed the data as listed Lab Results  Component Value Date   WBC 3.1 (L) 03/23/2021   HGB 13.4 03/23/2021   HCT 40.2 03/23/2021   MCV 94.8 03/23/2021   PLT 141.0 (L) 03/23/2021   Recent Labs    03/23/21 1030  NA 139  K 4.5  CL 105  CO2 28  GLUCOSE 99  BUN 26*  CREATININE 0.71  CALCIUM 8.9  PROT 6.5  ALBUMIN 4.6  AST 17  ALT 12  ALKPHOS 67  BILITOT 0.5     No results found.  ASSESSMENT & PLAN:   Thrombocytopenia ([place]) #Intermittent neutropenia; nadir absolute neutrophil count 1.3; asymptomatic.  Unclear etiology.  Bone marrow biopsy reviewed-from 2012 reviewed.  No obvious evidence of malignancy or acute process noted.  March 2022-White count 3.2; ANC 1.6 normal.  #Intermittent thrombocytopenia-nadir 120s to 130s; most recent 141 asymptomatic.  Discussed regarding possible ITP.  Discussed options for treatment include steroids/thrombopoietin stimulating agents/IVIG infusions etc.  However as patient is asymptomatic would not recommend any therapy at this time.  #We will check labs CBC CMP LDH; review of smear.  Follow-up to be decided.  # DISPOSITION: # labs today-  # follow up TBD-Dr.B   Cc; Dr.Tullo  All questions were answered. The patient knows to call the clinic with any problems, questions or concerns.  Cammie Sickle, MD 04/09/2021 2:39 PM

## 2021-04-09 NOTE — Progress Notes (Signed)
Patient states only concerns at the moment is why she is back?

## 2021-04-09 NOTE — Assessment & Plan Note (Addendum)
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  Unclear etiology.  Bone marrow biopsy reviewed-from 2012 reviewed.  No obvious evidence of malignancy or acute process noted.  March 2022-White count 3.2; ANC 1.6 normal.  #Intermittent thrombocytopenia-nadir 120s to 130s; most recent 141 asymptomatic.  Discussed regarding possible ITP.  Discussed options for treatment include steroids/thrombopoietin stimulating agents/IVIG infusions etc.  However as patient is asymptomatic would not recommend any therapy at this time.  #We will check labs CBC CMP LDH; review of smear.  Follow-up to be decided.  # DISPOSITION: # labs today-  # follow up TBD-Dr.B   Cc; Dr.Tullo

## 2021-04-20 DIAGNOSIS — S76011A Strain of muscle, fascia and tendon of right hip, initial encounter: Secondary | ICD-10-CM | POA: Diagnosis not present

## 2021-04-20 DIAGNOSIS — M7061 Trochanteric bursitis, right hip: Secondary | ICD-10-CM | POA: Diagnosis not present

## 2021-04-20 DIAGNOSIS — G5701 Lesion of sciatic nerve, right lower limb: Secondary | ICD-10-CM | POA: Diagnosis not present

## 2021-04-20 DIAGNOSIS — M7582 Other shoulder lesions, left shoulder: Secondary | ICD-10-CM | POA: Diagnosis not present

## 2021-04-20 DIAGNOSIS — Z96641 Presence of right artificial hip joint: Secondary | ICD-10-CM | POA: Diagnosis not present

## 2021-04-23 ENCOUNTER — Telehealth: Payer: Self-pay | Admitting: Internal Medicine

## 2021-04-23 NOTE — Telephone Encounter (Signed)
On 4/22-spoke to patient regarding results of the blood work chronic mild neutropenia-asymptomatic; intermittent thrombocytopenia/asymptomatic.  I would not recommend any further work-up at this time-unless patient has any significant clinical symptoms/signs.  Patient will follow up with me as needed ' patient will continue follow-up with PCP as planned.   GB  FYI-Dr.Tullo.

## 2021-04-26 DIAGNOSIS — F418 Other specified anxiety disorders: Secondary | ICD-10-CM | POA: Diagnosis not present

## 2021-04-30 DIAGNOSIS — S86112A Strain of other muscle(s) and tendon(s) of posterior muscle group at lower leg level, left leg, initial encounter: Secondary | ICD-10-CM | POA: Diagnosis not present

## 2021-05-01 ENCOUNTER — Ambulatory Visit: Payer: Medicare Other | Attending: Student

## 2021-05-01 ENCOUNTER — Other Ambulatory Visit: Payer: Self-pay

## 2021-05-01 DIAGNOSIS — M7701 Medial epicondylitis, right elbow: Secondary | ICD-10-CM | POA: Diagnosis not present

## 2021-05-01 DIAGNOSIS — M25551 Pain in right hip: Secondary | ICD-10-CM | POA: Insufficient documentation

## 2021-05-01 DIAGNOSIS — M7061 Trochanteric bursitis, right hip: Secondary | ICD-10-CM | POA: Diagnosis not present

## 2021-05-01 NOTE — Therapy (Signed)
[facility] [facility] [address], [place], [address] Phone: [phone]   Fax:  [phone]  Physical Therapy Evaluation  Patient Details  Name: Alexis Mcdonald MRN: [id number] Date of Birth: 04/29/1951 Referring Provider (PT): Lattie Corns, [place]   Encounter Date: 05/01/2021   PT End of Session - 05/01/21 1505    Visit Number 1    Number of Visits 17    Date for PT Re-Evaluation 06/28/21    Authorization Type 1    Authorization Time Period 10    PT Start Time 1505    PT Stop Time 1543    PT Time Calculation (min) 38 min    Activity Tolerance Patient tolerated treatment well    Behavior During Therapy [facility] for tasks assessed/performed           Past Medical History:  Diagnosis Date  . Borderline osteopenia    prior DEXA at [facility]   . Cancer ([place])    Melanoma removed from right hip  . Colon polyp 2009   next one due 2012  . 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 History of kidney stones   . Hypothyroidism   . Kidney stones   . Neutropenia    no infections,  bone marrow biopsy by [name] next week  . renal calculi    s/p lithotripsy, [place]  . Thyroid disease    hypothyroidism    Past Surgical History:  Procedure Laterality Date  . COLONOSCOPY  03/23/2010   Diverticulosis  . COLONOSCOPY W/ BIOPSIES  03/14/2006   Tubular adenoma of the rectum x2, upper lesion with focal high-grade dysplasia.  Polyps 9 mm in size.  Marland Kitchen COLONOSCOPY WITH PROPOFOL N/A 10/21/2018   Procedure: COLONOSCOPY WITH PROPOFOL;  Surgeon: Robert Bellow, MD;  Location: [facility] ENDOSCOPY;  Service: Endoscopy;  Laterality: N/A;  . DILATION AND CURETTAGE OF UTERUS    . JOINT REPLACEMENT  2005   done in [place]  . kidney stones removal    . MELANOMA EXCISION  06/29/2016  . right hip replacement    . SHOULDER ARTHROSCOPY WITH OPEN ROTATOR CUFF REPAIR Left 12/01/2018   Procedure:  SHOULDER ARTHROSCOPY WITH OPEN ROTATOR CUFF REPAIR;  Surgeon: Corky Mull, MD;  Location: [facility] ORS;  Service: Orthopedics;  Laterality: Left;    There were no vitals filed for this visit.    Subjective Assessment - 05/01/21 1508    Subjective R hip pain. 0/10 currently, 6/10 at most for the past month.    Pertinent History L hip pain. Pulled L gastroc yesterday doing an exercise demonstration. Had a steroid injectino 2 weeks ago which helped. Had L THA posterior approach 2002. Pt states that she felt like her L hip was popping out. Pt states being very actvie. When pt stands up a lot into full hip extension, pt feels R posterior hip pain. Pt does a lot of impact stff. 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 X-ray was negative for abdnormalities or changes. Might have to do an MRI if the pain returns.    Patient Stated Goals Not to have a revision for R hip and be able to do her actiities without pain.    Currently in Pain? No/denies    Pain Score 0-No pain    Pain Location Hip    Pain Orientation Right;Posterior    Pain Type Chronic pain  Pain Onset More than a month ago    Pain Frequency Occasional    Aggravating Factors  standing up into full hip extension a lot, jumping, hiking up a hill    Pain Relieving Factors Sitting and resting              [facility] PT Assessment - 05/01/21 1518      Assessment   Medical Diagnosis R hip bursitis, piriformis syndrome, Muscle strain of R gluteal region    Referring Provider (PT) Lattie Corns, PA    Onset Date/Surgical Date 04/20/21    Hand Dominance Right    Next MD Visit unknown      Observation/Other Assessments   Focus on Therapeutic Outcomes (FOTO)  Hip FOTO 69      Posture/Postural Control   Posture Comments Decreased L weight bearing secondar to calf pain, L hip in slight ER      Strength   Right Hip Flexion 4/5    Right Hip Extension 4-/5    Right Hip ABduction 4/5    Left Hip Flexion 4+/5    Left Hip  Extension 4/5    Left Hip ABduction 4+/5      Palpation   Palpation comment R glute med muscle tension      Ambulation/Gait   Gait Comments Antlagic, decreaset stance L LE , step to pattern secondary to L gastroc pain. No L foot push-off.                      Objective measurements completed on examination: See above findings.    TTP R greater trochatner and R piriformis area  (-) Obers test (knee straight and bent) R. Did not go past neutral adductino hpowever secondary to hx of THA posterior approach. (+) long sit test suggesting anterior nutation R innominate. (+) step down test R L, no pain but decreased femoral control   Medbridge Access Code [id number]  Therapeutic exercise  Seated hip extension isometrics 10x5 seconds for 2 sets  Seated hip adduction isometrics, neutral thigh position 10x5 seconds   Reviewed HEP. Pt demonstrated and verbalized understanding.    Improved exercise technique, movement at target joints, use of target muscles after mod verbal, visual, tactile cues.   Response to treatment Pt tolerated session well withotu aggravation of symptoms.   Clinical impression  Patient is a 70 year old female who came to physical therapy secondary to R hip pain. She also presents with decreased femoral control, TTP R posterior lateral hip and piriformis area, positive special test suggesting lumbopelvic involvement, R hip abduction and extension weakness, and difficulty standing up into full hip extension multiple times as well as hiking up a hill secondary to her symptoms. Pt will benefit from skilled physical therapy services to address the aforementioned deficits.                     PT Education - 05/01/21 1637    Education Details ther-ex, HEP, plan of care    Person(s) Educated Patient    Methods Explanation;Demonstration;Tactile cues;Verbal cues;Handout    Comprehension Returned demonstration;Verbalized understanding             PT Short Term Goals - 05/01/21 1646      PT SHORT TERM GOAL #1   Title Patient will be independent with her initial HEP to decrease pain, improve strength, function, ability to hike with less discomfort.    Baseline Pt has started her initial HEP (05/01/2021)  Time 3    Period Weeks    Status New    Target Date 05/24/21              PT Long Term Goals - 05/01/21 1648      PT LONG TERM GOAL #1   Title Patient will have a decrease in R hip pain to 1/10 or less at most to promote ability to perform transfers, hike up a hill more comfortably.    Baseline 6/10 R hip pain at most for the past month (05/01/2021)    Time 8    Period Weeks    Status New    Target Date 06/28/21      PT LONG TERM GOAL #2   Title Pt will improve R hip extension and abduction strength by at least 1/2 MMT grade to promote femoral control as well as improve ability to perform activities such as hiking up hills more comfortably.    Baseline R hip extension 4-/5, R hip abduction 4/5 (05/01/2021)    Time 8    Period Weeks    Status New    Target Date 06/28/21      PT LONG TERM GOAL #3   Title Pt will improve her FOTO score by at least 10 points as a demonstration of improved function.    Baseline Hip FOTO 69 (05/01/2021)    Time 8    Period Weeks    Status New    Target Date 06/28/21                  Plan - 05/01/21 1638    Clinical Impression Statement Patient is a 70 year old female who came to physical therapy secondary to R hip pain. She also presents with decreased femoral control, TTP R posterior lateral hip and piriformis area, positive special test suggesting lumbopelvic involvement, R hip abduction and extension weakness, and difficulty standing up into full hip extension multiple times as well as hiking up a hill secondary to her symptoms. Pt will benefit from skilled physical therapy services to address the aforementioned deficits.    Personal Factors and Comorbidities Comorbidity 3+     Comorbidities Hx of CA, R THA, borderline osteopenia    Examination-Activity Limitations Transfers;Stairs    Stability/Clinical Decision Making Stable/Uncomplicated    Clinical Decision Making Low    Rehab Potential Fair    PT Frequency 2x / week    PT Duration 8 weeks    PT Treatment/Interventions Therapeutic activities;Therapeutic exercise;Neuromuscular re-education;Patient/family education;Manual techniques;Dry needling;Aquatic Therapy;Electrical Stimulation;Iontophoresis 51m/ml Dexamethasone    PT Next Visit Plan hip strengthening, femoral control, manual techniques, modalities PRN    PT Home Exercise Plan Medbridge Access Code [email or web address]    Consulted and Agree with Plan of Care Patient           Patient will benefit from skilled therapeutic intervention in order to improve the following deficits and impairments:  Pain,Postural dysfunction,Improper body mechanics,Decreased strength  Visit Diagnosis: Pain in right hip - Plan: PT plan of care cert/re-cert  Trochanteric bursitis of right hip - Plan: PT plan of care cert/re-cert     Problem List Patient Active Problem List   Diagnosis Date Noted  . Trochanteric bursitis, right hip 03/25/2021  . Major depressive disorder with current active episode 09/26/2020  . Dystrophic nail 02/10/2020  . S/P shoulder surgery 07/09/2019  . Insomnia due to stress 07/09/2019  . Post-menopausal atrophic vaginitis 03/10/2019  . Cough productive of clear sputum 03/10/2019  . Abnormal  blood level of cobalt 06/27/2018  . 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 Melanoma of skin ([place]) 07/27/2015  . History of total right hip arthroplasty 02/09/2015  . Osteoporosis 05/25/2014  . 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 History of nephrolithiasis 07/13/2013  . Adverse drug reaction 06/29/2013  . History of adenomatous polyp of colon 06/01/2013  . Hemochromatosis carrier   . Thrombocytopenia ([place]) 05/07/2013  . Initial Medicare annual wellness  visit 05/07/2013  . Hypothyroidism 09/25/2011  . History of idiopathic seizure 09/25/2011  . Screening for cervical cancer 09/25/2011  . Screening for breast cancer 09/25/2011  . Neutropenia [facility])     Joneen Boers PT, DPT  05/01/2021, 5:05 PM  [place] [facility] [address], [place], [address] Phone: [phone]   Fax:  [phone]  Name: Alexis Mcdonald MRN: [id number] Date of Birth: 07-18-1951

## 2021-05-01 NOTE — Patient Instructions (Signed)
Access Code: [email or web address] URL: [email or web address] Date: 05/01/2021 Prepared by: Joneen Boers  Exercises Seated Hip Adduction Isometrics with Diona Foley - 1 x daily - 7 x weekly - 3 sets - 10 reps - 5 seconds hold

## 2021-05-03 ENCOUNTER — Other Ambulatory Visit: Payer: Self-pay

## 2021-05-03 ENCOUNTER — Ambulatory Visit: Payer: Medicare Other

## 2021-05-03 DIAGNOSIS — M25551 Pain in right hip: Secondary | ICD-10-CM

## 2021-05-03 DIAGNOSIS — M7701 Medial epicondylitis, right elbow: Secondary | ICD-10-CM

## 2021-05-03 DIAGNOSIS — M7061 Trochanteric bursitis, right hip: Secondary | ICD-10-CM

## 2021-05-03 NOTE — Therapy (Signed)
[place] [facility] [address], [place], [address] Phone: [phone]   Fax:  [phone]  Physical Therapy Treatment  Patient Details  Name: Alexis Mcdonald MRN: [id number] Date of Birth: 1951-07-05 Referring Provider (PT): Lattie Corns, [place]   Encounter Date: 05/03/2021   PT End of Session - 05/03/21 1421    Visit Number 2    Number of Visits 17    Date for PT Re-Evaluation 06/28/21    Authorization Type 2    Authorization Time Period 10    PT Start Time 1421    PT Stop Time 1502    PT Time Calculation (min) 41 min    Activity Tolerance Patient tolerated treatment well    Behavior During Therapy [name] for tasks assessed/performed           Past Medical History:  Diagnosis Date  . Borderline osteopenia    prior DEXA at [facility]   . Cancer ([place])    Melanoma removed from right hip  . Colon polyp 2009   next one due 2012  . 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 History of kidney stones   . Hypothyroidism   . Kidney stones   . Neutropenia    no infections,  bone marrow biopsy by [name] next week  . renal calculi    s/p lithotripsy, [place]  . Thyroid disease    hypothyroidism    Past Surgical History:  Procedure Laterality Date  . COLONOSCOPY  03/23/2010   Diverticulosis  . COLONOSCOPY W/ BIOPSIES  03/14/2006   Tubular adenoma of the rectum x2, upper lesion with focal high-grade dysplasia.  Polyps 9 mm in size.  Marland Kitchen COLONOSCOPY WITH PROPOFOL N/A 10/21/2018   Procedure: COLONOSCOPY WITH PROPOFOL;  Surgeon: Robert Bellow, MD;  Location: [facility] ENDOSCOPY;  Service: Endoscopy;  Laterality: N/A;  . DILATION AND CURETTAGE OF UTERUS    . JOINT REPLACEMENT  2005   done in [place]  . kidney stones removal    . MELANOMA EXCISION  06/29/2016  . right hip replacement    . SHOULDER ARTHROSCOPY WITH OPEN ROTATOR CUFF REPAIR Left 12/01/2018   Procedure:  SHOULDER ARTHROSCOPY WITH OPEN ROTATOR CUFF REPAIR;  Surgeon: Corky Mull, MD;  Location: [facility] ORS;  Service: Orthopedics;  Laterality: Left;    There were no vitals filed for this visit.   Subjective Assessment - 05/03/21 1422    Subjective R hip is good. Was a little sore after session but its fine today. No pain currently. The tenderness is better.    Pertinent History L hip pain. Pulled L gastroc yesterday doing an exercise demonstration. Had a steroid injectino 2 weeks ago which helped. Had L THA posterior approach 2002. Pt states that she felt like her L hip was popping out. Pt states being very actvie. When pt stands up a lot into full hip extension, pt feels R posterior hip pain. Pt does a lot of impact stff. 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 X-ray was negative for abdnormalities or changes. Might have to do an MRI if the pain returns.    Patient Stated Goals Not to have a revision for R hip and be able to do her actiities without pain.    Currently in Pain? No/denies    Pain Score 0-No pain    Pain Onset More than a month ago  PT Education - 05/03/21 1426    Education Details ther-ex, HEP    Person(s) Educated Patient    Methods Explanation;Demonstration;Tactile cues;Verbal cues;Handout    Comprehension Returned demonstration;Verbalized understanding          Objective  TTP R greater trochatner and R piriformis area  (-) Obers test (knee straight and bent) R. Did not go past neutral adductino hpowever secondary to hx of THA posterior approach. (+) long sit test suggesting anterior nutation R innominate. (+) step down test R L, no pain but decreased femoral control   Medbridge Access Code [id number]  Therapeutic exercise  Seated hip extension isometrics   R 10x5 seconds for 2 sets  hooklying clamshell isometrics at 40% effort 1 min then 1 minute rest x 5    Hooklying hip extension  isometrics, leg straight   R 10x5 seconds for 3 sets  Bridge with L foot march to work R glute max muscle 10x2  Total gym, Height 14  Single leg squads R LE, emphasis on femoral control 10x3  SLS with noUE assist, emphasis on level pelvis   R 10x10 seconds    Then with Pt holding 5 lbs with L hand 10x10 seconds  Prone glute max extension   R 10x5 seconds   Improved exercise technique, movement at target joints, use of target muscles after mod verbal, visual, tactile cues.   Response to treatment Pt tolerated session well withotu aggravation of symptoms.   Clinical impression  Worked on isometric loading to R glute med/piriformis area to promote proper healing to affected tissues. Also worked on glute max strengthening and femoral control to continue to decrease stress to affected tissues. Pt tolerated session well without aggravation of symptoms. Pt will benefit from continued skilled physical therapy services to decrease pain, improve strength and function.      PT Short Term Goals - 05/01/21 1646      PT SHORT TERM GOAL #1   Title Patient will be independent with her initial HEP to decrease pain, improve strength, function, ability to hike with less discomfort.    Baseline Pt has started her initial HEP (05/01/2021)    Time 3    Period Weeks    Status New    Target Date 05/24/21             PT Long Term Goals - 05/01/21 1648      PT LONG TERM GOAL #1   Title Patient will have a decrease in R hip pain to 1/10 or less at most to promote ability to perform transfers, hike up a hill more comfortably.    Baseline 6/10 R hip pain at most for the past month (05/01/2021)    Time 8    Period Weeks    Status New    Target Date 06/28/21      PT LONG TERM GOAL #2   Title Pt will improve R hip extension and abduction strength by at least 1/2 MMT grade to promote femoral control as well as improve ability to perform activities such as hiking up hills more comfortably.     Baseline R hip extension 4-/5, R hip abduction 4/5 (05/01/2021)    Time 8    Period Weeks    Status New    Target Date 06/28/21      PT LONG TERM GOAL #3   Title Pt will improve her FOTO score by at least 10 points as a demonstration of improved function.    Baseline Hip  FOTO 69 (05/01/2021)    Time 8    Period Weeks    Status New    Target Date 06/28/21                 Plan - 05/03/21 1427    Clinical Impression Statement Worked on isometric loading to R glute med/piriformis area to promote proper healing to affected tissues. Also worked on glute max strengthening and femoral control to continue to decrease stress to affected tissues. Pt tolerated session well without aggravation of symptoms. Pt will benefit from continued skilled physical therapy services to decrease pain, improve strength and function.    Personal Factors and Comorbidities Comorbidity 3+    Comorbidities Hx of CA, R THA, borderline osteopenia    Examination-Activity Limitations Transfers;Stairs    Stability/Clinical Decision Making Stable/Uncomplicated    Rehab Potential Fair    PT Frequency 2x / week    PT Duration 8 weeks    PT Treatment/Interventions Therapeutic activities;Therapeutic exercise;Neuromuscular re-education;Patient/family education;Manual techniques;Dry needling;Aquatic Therapy;Electrical Stimulation;Iontophoresis 57m/ml Dexamethasone    PT Next Visit Plan hip strengthening, femoral control, manual techniques, modalities PRN    PT Home Exercise Plan Medbridge Access Code [email or web address]    Consulted and Agree with Plan of Care Patient           Patient will benefit from skilled therapeutic intervention in order to improve the following deficits and impairments:  Pain,Postural dysfunction,Improper body mechanics,Decreased strength  Visit Diagnosis: Trochanteric bursitis of right hip  Epicondylitis elbow, medial, right  Pain in right hip     Problem List Patient Active Problem List    Diagnosis Date Noted  . Trochanteric bursitis, right hip 03/25/2021  . Major depressive disorder with current active episode 09/26/2020  . Dystrophic nail 02/10/2020  . S/P shoulder surgery 07/09/2019  . Insomnia due to stress 07/09/2019  . Post-menopausal atrophic vaginitis 03/10/2019  . Cough productive of clear sputum 03/10/2019  . Abnormal blood level of cobalt 06/27/2018  . 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 Melanoma of skin ([place] 07/27/2015  . History of total right hip arthroplasty 02/09/2015  . Osteoporosis 05/25/2014  . 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 History of nephrolithiasis 07/13/2013  . Adverse drug reaction 06/29/2013  . History of adenomatous polyp of colon 06/01/2013  . Hemochromatosis carrier   . Thrombocytopenia ([place] 05/07/2013  . Initial Medicare annual wellness visit 05/07/2013  . Hypothyroidism 09/25/2011  . History of idiopathic seizure 09/25/2011  . Screening for cervical cancer 09/25/2011  . Screening for breast cancer 09/25/2011  . Neutropenia ([facility]     MJoneen BoersPT, DPT   05/03/2021, 3:18 PM  [facility] [address] [place] [id number]: [phone]  Fax:  [phone] Name: Alexis BarkMRN: [id number] of Birth: [birth year]

## 2021-05-03 NOTE — Patient Instructions (Signed)
   Lying on your bed (hips less than 90 degrees flexion) ° ° Belt around thighs, which are shoulder width apart,  ° ° Press knees out against belt with 40 % effort for 1 minute ° ° Rest for 1-2 minutes ° ° Perform 5 times per set.  ° ° ° Do 3 sets per day. Each set to be performed a few hours apart. ° °

## 2021-05-07 ENCOUNTER — Ambulatory Visit: Payer: Medicare Other

## 2021-05-09 ENCOUNTER — Ambulatory Visit: Payer: Medicare Other

## 2021-05-09 ENCOUNTER — Other Ambulatory Visit: Payer: Self-pay

## 2021-05-09 DIAGNOSIS — M7061 Trochanteric bursitis, right hip: Secondary | ICD-10-CM | POA: Diagnosis not present

## 2021-05-09 DIAGNOSIS — M25551 Pain in right hip: Secondary | ICD-10-CM | POA: Diagnosis not present

## 2021-05-09 DIAGNOSIS — M7701 Medial epicondylitis, right elbow: Secondary | ICD-10-CM | POA: Diagnosis not present

## 2021-05-09 NOTE — Patient Instructions (Signed)
Access Code: [email or web address] URL: [email or web address] Date: 05/09/2021 Prepared by: Joneen Boers  Exercises Seated Hip Adduction Isometrics with Diona Foley - 1 x daily - 7 x weekly - 3 sets - 10 reps - 5 seconds hold Prone Hip Extension with Bent Knee - 2-3 x daily - 7 x weekly - 1 sets - 10 reps - 5 seconds hold

## 2021-05-09 NOTE — Therapy (Signed)
Alexis Mcdonald Alexis S. [address] Alexis Mcdonald, Alexis Mcdonald Phone: [phone]   Fax:  [phone]  Physical Therapy Treatment  Patient Details  Name: Alexis Mcdonald MRN: [id number] Date of Birth: 1951/04/15 Referring Provider (PT): Lattie Corns, [place]   Encounter Date: 05/09/2021   PT End of Session - 05/09/21 1418    Visit Number 3    Number of Visits 17    Date for PT Re-Evaluation 06/28/21    Authorization Type 3    Authorization Time Period 10    PT Start Time 1418    PT Stop Time 1458    PT Time Calculation (min) 40 min    Activity Tolerance Patient tolerated treatment well    Behavior During Therapy [facility] for tasks assessed/performed           Past Medical History:  Diagnosis Date  . Borderline osteopenia    prior DEXA at [facility]   . Cancer ([place])    Melanoma removed from right hip  . Colon polyp 2009   next one due 2012  . 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 History of kidney stones   . Hypothyroidism   . Kidney stones   . Neutropenia    no infections,  bone marrow biopsy by [name] next week  . renal calculi    s/p lithotripsy, [place]  . Thyroid disease    hypothyroidism    Past Surgical History:  Procedure Laterality Date  . COLONOSCOPY  03/23/2010   Diverticulosis  . COLONOSCOPY W/ BIOPSIES  03/14/2006   Tubular adenoma of the rectum x2, upper lesion with focal high-grade dysplasia.  Polyps 9 mm in size.  Marland Kitchen COLONOSCOPY WITH PROPOFOL N/A 10/21/2018   Procedure: COLONOSCOPY WITH PROPOFOL;  Surgeon: Robert Bellow, MD;  Location: [facility] ENDOSCOPY;  Service: Endoscopy;  Laterality: N/A;  . DILATION AND CURETTAGE OF UTERUS    . JOINT REPLACEMENT  2005   done in [place]  . kidney stones removal    . MELANOMA EXCISION  06/29/2016  . right hip replacement    . SHOULDER ARTHROSCOPY WITH OPEN ROTATOR CUFF REPAIR Left 12/01/2018   Procedure:  SHOULDER ARTHROSCOPY WITH OPEN ROTATOR CUFF REPAIR;  Surgeon: Corky Mull, MD;  Location: [facility] ORS;  Service: Orthopedics;  Laterality: Left;    There were no vitals filed for this visit.   Subjective Assessment - 05/09/21 1418    Subjective R hip is doing well. Sore in a different spot where she is working it.    Pertinent History R hip pain. Pulled L gastroc yesterday doing an exercise demonstration. Had a steroid injectino 2 weeks ago which helped. Had L THA posterior approach 2002. Pt states that she felt like her L hip was popping out. Pt states being very actvie. When pt stands up a lot into full hip extension, pt feels R posterior hip pain. Pt does a lot of impact stff. 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 X-ray was negative for abdnormalities or changes. Might have to do an MRI if the pain returns.    Patient Stated Goals Not to have a revision for R hip and be able to do her actiities without pain.    Currently in Pain? No/denies    Pain Score 0-No pain    Pain Onset More than a month ago  PT Education - 05/09/21 1421    Education Details ther-ex, HEP    Person(s) Educated Patient    Methods Explanation;Demonstration;Tactile cues;Verbal cues;Handout    Comprehension Returned demonstration;Verbalized understanding           Objective   TTP R greater trochatner and R piriformis area  (-) Obers test (knee straight and bent) R. Did not go past neutral adductino hpowever secondary to hx of THA posterior approach. (+) long sit test suggesting anterior nutation R innominate. (+) step down test R L, no pain but decreased femoral control   MedbridgeAccess Code [email or web address]  Therapeutic exercise  Seated hip extension isometrics              R 10x5 seconds for 2 sets  Side step 32 ft to the R and 32 ft to the L   SLS with mini squat on R LE with L hip abduction with L foot on furniture slider  10x3  Hooklying hip extension isometrics, leg straight              R 10x5 seconds for 3 sets  Bridge with L foot march to work R glute max muscle 10x2  Supine hip abduction isometrics PT manual resistance   R 10x5 seconds for 3 sets  Prone glute max extension              R 10x5 seconds, then 10x    Total gym, Height 14             Single leg squads R LE, emphasis on femoral control 10x3   Forward step up onto dyna disc with one UE to no UE assist   R LE 10x3    Improved exercise technique, movement at target joints, use of target muscles after mod verbal, visual, tactile cues.  Response to treatment Pt tolerated session well withotu aggravation of symptoms.   Clinical impression Good progress with decreased R hip pain based on subjective reports. Continued working on glute muscle loading, as well as femoral control to promote healing. Pt tolerated session well without aggravation of symptoms. Pt will benefit from continued skilled physical therapy services to decrease pain, improve strength and function.    PT Short Term Goals - 05/01/21 1646      PT SHORT TERM GOAL #1   Title Patient will be independent with her initial HEP to decrease pain, improve strength, function, ability to hike with less discomfort.    Baseline Pt has started her initial HEP (05/01/2021)    Time 3    Period Weeks    Status New    Target Date 05/24/21             PT Long Term Goals - 05/01/21 1648      PT LONG TERM GOAL #1   Title Patient will have a decrease in R hip pain to 1/10 or less at most to promote ability to perform transfers, hike up a hill more comfortably.    Baseline 6/10 R hip pain at most for the past month (05/01/2021)    Time 8    Period Weeks    Status New    Target Date 06/28/21      PT LONG TERM GOAL #2   Title Pt will improve R hip extension and abduction strength by at least 1/2 MMT grade to promote femoral control as well as improve ability to perform  activities such as hiking up hills more comfortably.    Baseline R hip  extension 4-/5, R hip abduction 4/5 (05/01/2021)    Time 8    Period Weeks    Status New    Target Date 06/28/21      PT LONG TERM GOAL #3   Title Pt will improve her FOTO score by at least 10 points as a demonstration of improved function.    Baseline Hip FOTO 69 (05/01/2021)    Time 8    Period Weeks    Status New    Target Date 06/28/21                 Plan - 05/09/21 1422    Clinical Impression Statement Good progress with decreased R hip pain based on subjective reports. Continued working on glute muscle loading, as well as femoral control to promote healing. Pt tolerated session well without aggravation of symptoms. Pt will benefit from continued skilled physical therapy services to decrease pain, improve strength and function.    Personal Factors and Comorbidities Comorbidity 3+    Comorbidities Hx of CA, R THA, borderline osteopenia    Examination-Activity Limitations Transfers;Stairs    Stability/Clinical Decision Making Stable/Uncomplicated    Rehab Potential Fair    PT Frequency 2x / week    PT Duration 8 weeks    PT Treatment/Interventions Therapeutic activities;Therapeutic exercise;Neuromuscular re-education;Patient/family education;Manual techniques;Dry needling;Aquatic Therapy;Electrical Stimulation;Iontophoresis 43m/ml Dexamethasone    PT Next Visit Plan hip strengthening, femoral control, manual techniques, modalities PRN    PT Home Exercise Plan Medbridge Access Code [email or web address]    Consulted and Agree with Plan of Care Patient           Patient will benefit from skilled therapeutic intervention in order to improve the following deficits and impairments:  Pain,Postural dysfunction,Improper body mechanics,Decreased strength  Visit Diagnosis: Trochanteric bursitis of right hip  Pain in right hip     Problem List Patient Active Problem List   Diagnosis Date Noted  . Trochanteric  bursitis, right hip 03/25/2021  . Major depressive disorder with current active episode 09/26/2020  . Dystrophic nail 02/10/2020  . S/P shoulder surgery 07/09/2019  . Insomnia due to stress 07/09/2019  . Post-menopausal atrophic vaginitis 03/10/2019  . Cough productive of clear sputum 03/10/2019  . Abnormal blood level of cobalt 06/27/2018  . 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 Melanoma of skin ([place] 07/27/2015  . History of total right hip arthroplasty 02/09/2015  . Osteoporosis 05/25/2014  . 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 History of nephrolithiasis 07/13/2013  . Adverse drug reaction 06/29/2013  . History of adenomatous polyp of colon 06/01/2013  . Hemochromatosis carrier   . [place] 05/07/2013  . Initial Medicare annual wellness visit 05/07/2013  . Hypothyroidism 09/25/2011  . History of idiopathic seizure 09/25/2011  . Screening for cervical cancer 09/25/2011  . Screening for breast cancer 09/25/2011  . Neutropenia ([place]     Alexis Mcdonald 05/09/2021, 2:59 PM  [facility] [place] Alexis [address]. [address] [place] [address]Phone: [phone]  Fax:  [phone] Name: PRashel OkeefeMRN: [id number] of Birth: [birth year]

## 2021-05-19 DIAGNOSIS — Z23 Encounter for immunization: Secondary | ICD-10-CM | POA: Diagnosis not present

## 2021-05-22 ENCOUNTER — Other Ambulatory Visit: Payer: Self-pay

## 2021-05-22 ENCOUNTER — Ambulatory Visit: Payer: Medicare Other

## 2021-05-22 DIAGNOSIS — M7701 Medial epicondylitis, right elbow: Secondary | ICD-10-CM | POA: Diagnosis not present

## 2021-05-22 DIAGNOSIS — M7061 Trochanteric bursitis, right hip: Secondary | ICD-10-CM

## 2021-05-22 DIAGNOSIS — M25551 Pain in right hip: Secondary | ICD-10-CM | POA: Diagnosis not present

## 2021-05-22 NOTE — Therapy (Signed)
[place] [facility] [address] [place], [address] Phone: [phone]   Fax:  [phone]  Physical Therapy Treatment  Patient Details  Name: Alexis Mcdonald MRN: [id number] Date of Birth: 1951/12/16 Referring Provider (PT): Lattie Corns, [place]   Encounter Date: 05/22/2021   PT End of Session - 05/22/21 1519    Visit Number 4    Number of Visits 17    Date for PT Re-Evaluation 06/28/21    Authorization Type 4    Authorization Time Period 10    PT Start Time 1432    PT Stop Time 1515    PT Time Calculation (min) 43 min    Activity Tolerance Patient tolerated treatment well    Behavior During Therapy [facility] for tasks assessed/performed           Past Medical History:  Diagnosis Date  . Borderline osteopenia    prior DEXA at [facility]   . Cancer ([place])    Melanoma removed from right hip  . Colon polyp 2009   next one due 2012  . 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 History of kidney stones   . Hypothyroidism   . Kidney stones   . Neutropenia    no infections,  bone marrow biopsy by [name] next week  . renal calculi    s/p lithotripsy, [place]  . Thyroid disease    hypothyroidism    Past Surgical History:  Procedure Laterality Date  . COLONOSCOPY  03/23/2010   Diverticulosis  . COLONOSCOPY W/ BIOPSIES  03/14/2006   Tubular adenoma of the rectum x2, upper lesion with focal high-grade dysplasia.  Polyps 9 mm in size.  Marland Kitchen COLONOSCOPY WITH PROPOFOL N/A 10/21/2018   Procedure: COLONOSCOPY WITH PROPOFOL;  Surgeon: Robert Bellow, MD;  Location: [facility] ENDOSCOPY;  Service: Endoscopy;  Laterality: N/A;  . DILATION AND CURETTAGE OF UTERUS    . JOINT REPLACEMENT  2005   done in [place]  . kidney stones removal    . MELANOMA EXCISION  06/29/2016  . right hip replacement    . SHOULDER ARTHROSCOPY WITH OPEN ROTATOR CUFF REPAIR Left 12/01/2018   Procedure:  SHOULDER ARTHROSCOPY WITH OPEN ROTATOR CUFF REPAIR;  Surgeon: Corky Mull, MD;  Location: [facility] ORS;  Service: Orthopedics;  Laterality: Left;    There were no vitals filed for this visit.   Subjective Assessment - 05/22/21 1433    Subjective Pt reports no R hip pain. Happy with progress in PT thus far.    Pertinent History R hip pain. Pulled L gastroc yesterday doing an exercise demonstration. Had a steroid injectino 2 weeks ago which helped. Had L THA posterior approach 2002. Pt states that she felt like her L hip was popping out. Pt states being very actvie. When pt stands up a lot into full hip extension, pt feels R posterior hip pain. Pt does a lot of impact stff. 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 X-ray was negative for abdnormalities or changes. Might have to do an MRI if the pain returns.    Patient Stated Goals Not to have a revision for R hip and be able to do her actiities without pain.    Currently in Pain? No/denies    Pain Score 0-No pain    Pain Onset More than a month ago           There.ex:  2x6, 5 sec holds R hip extension in prone with 2 # AW on RLE.   Supine single leg bridge on RLE. Straight leg on LLE. 2x10 bilaterally. Initial PT demo. Good form/technique after demo.  Total Gym level 16. RLE single leg squat  Total Gym Level 19 to progress difficulty. RLE single leg squat. Focus on motor control of keeping R knee in neutral position.  RLE running man on 8" step. Pt reports very easy and excellent form.   Progressed to eccentric 6" step down on RLE with focus on femoral control. 2x12. PT TC on lateral knee to maintain lateral hip activation to prevent femoral IR and adduction.  SLS hip stability 4 KG ball toss on rebounder. 2x20 reps. Second set pt encouraged to step further back to challenge hip stability more. PT verbalized more difficult. Good use of hip strategy to correct lateral sway intermittently.    PT Education - 05/22/21 1518     Education Details form/technique with exercise.    Person(s) Educated Patient    Methods Explanation;Demonstration;Tactile cues;Verbal cues    Comprehension Verbalized understanding;Returned demonstration            PT Short Term Goals - 05/01/21 1646      PT SHORT TERM GOAL #1   Title Patient will be independent with her initial HEP to decrease pain, improve strength, function, ability to hike with less discomfort.    Baseline Pt has started her initial HEP (05/01/2021)    Time 3    Period Weeks    Status New    Target Date 05/24/21             PT Long Term Goals - 05/01/21 1648      PT LONG TERM GOAL #1   Title Patient will have a decrease in R hip pain to 1/10 or less at most to promote ability to perform transfers, hike up a hill more comfortably.    Baseline 6/10 R hip pain at most for the past month (05/01/2021)    Time 8    Period Weeks    Status New    Target Date 06/28/21      PT LONG TERM GOAL #2   Title Pt will improve R hip extension and abduction strength by at least 1/2 MMT grade to promote femoral control as well as improve ability to perform activities such as hiking up hills more comfortably.    Baseline R hip extension 4-/5, R hip abduction 4/5 (05/01/2021)    Time 8    Period Weeks    Status New    Target Date 06/28/21      PT LONG TERM GOAL #3   Title Pt will improve her FOTO score by at least 10 points as a demonstration of improved function.    Baseline Hip FOTO 69 (05/01/2021)    Time 8    Period Weeks    Status New    Target Date 06/28/21                 Plan - 05/22/21 1519    Clinical Impression Statement Stayed on track on primary PT's POC. Pt progressed in glut and lateral hip strengthengin and focus on femoral control. Pt displays strong motivation to complete exercises correctly and ahd no pain throughout and post session. Pt will continue to benefti from skilled PT services to decrease pain and improve strength/function with  recreational and functional tasks.    Personal Factors and Comorbidities Comorbidity 3+  Comorbidities Hx of CA, R THA, borderline osteopenia    Examination-Activity Limitations Transfers;Stairs    Stability/Clinical Decision Making Stable/Uncomplicated    Rehab Potential Fair    PT Frequency 2x / week    PT Duration 8 weeks    PT Treatment/Interventions Therapeutic activities;Therapeutic exercise;Neuromuscular re-education;Patient/family education;Manual techniques;Dry needling;Aquatic Therapy;Electrical Stimulation;Iontophoresis 76m/ml Dexamethasone    PT Next Visit Plan hip strengthening, femoral control, manual techniques, modalities PRN    PT Home Exercise Plan Medbridge Access Code [email or web address]    Consulted and Agree with Plan of Care Patient           Patient will benefit from skilled therapeutic intervention in order to improve the following deficits and impairments:  Pain,Postural dysfunction,Improper body mechanics,Decreased strength  Visit Diagnosis: Trochanteric bursitis of right hip  Pain in right hip     Problem List Patient Active Problem List   Diagnosis Date Noted  . Trochanteric bursitis, right hip 03/25/2021  . Major depressive disorder with current active episode 09/26/2020  . Dystrophic nail 02/10/2020  . S/P shoulder surgery 07/09/2019  . Insomnia due to stress 07/09/2019  . Post-menopausal atrophic vaginitis 03/10/2019  . Cough productive of clear sputum 03/10/2019  . Abnormal blood level of cobalt 06/27/2018  . 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 Melanoma of skin ([place] 07/27/2015  . History of total right hip arthroplasty 02/09/2015  . Osteoporosis 05/25/2014  . 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 History of nephrolithiasis 07/13/2013  . Adverse drug reaction 06/29/2013  . History of adenomatous polyp of colon 06/01/2013  . Hemochromatosis carrier   . Thrombocytopenia ([place] 05/07/2013  . Initial Medicare annual wellness visit 05/07/2013   . Hypothyroidism 09/25/2011  . History of idiopathic seizure 09/25/2011  . Screening for cervical cancer 09/25/2011  . Screening for breast cancer 09/25/2011  . [place] ([place]     MSalem Caster Fairly IV, PT, DPT Physical Therapist- [facility] 05/22/2021, 3:22 PM  [facility] [address] [place] [address]Phone: [phone]  Fax:  [phone] Name: PJameyah FennewaldMRN: [id number] of Birth: [birth year]

## 2021-05-23 DIAGNOSIS — M7582 Other shoulder lesions, left shoulder: Secondary | ICD-10-CM | POA: Diagnosis not present

## 2021-05-23 DIAGNOSIS — M7061 Trochanteric bursitis, right hip: Secondary | ICD-10-CM | POA: Diagnosis not present

## 2021-05-23 DIAGNOSIS — Z96641 Presence of right artificial hip joint: Secondary | ICD-10-CM | POA: Diagnosis not present

## 2021-05-23 DIAGNOSIS — G5701 Lesion of sciatic nerve, right lower limb: Secondary | ICD-10-CM | POA: Diagnosis not present

## 2021-05-24 ENCOUNTER — Other Ambulatory Visit: Payer: Self-pay

## 2021-05-24 ENCOUNTER — Ambulatory Visit: Payer: Medicare Other

## 2021-05-24 DIAGNOSIS — M7061 Trochanteric bursitis, right hip: Secondary | ICD-10-CM

## 2021-05-24 DIAGNOSIS — M25551 Pain in right hip: Secondary | ICD-10-CM | POA: Diagnosis not present

## 2021-05-24 DIAGNOSIS — M7701 Medial epicondylitis, right elbow: Secondary | ICD-10-CM | POA: Diagnosis not present

## 2021-05-24 NOTE — Therapy (Signed)
[facility] [address] [place], [address] Phone: [phone]   Fax:  [phone]  Physical Therapy Treatment  Patient Details  Name: Alexis Mcdonald MRN: [id number] Date of Birth: [birth year] Referring Provider (PT): Lattie Corns, [place]   Encounter Date: 05/24/2021   PT End of Session - 05/24/21 1332    Visit Number 5    Number of Visits 17    Date for PT Re-Evaluation 06/28/21    Authorization Type 5    Authorization Time Period 10    PT Start Time 1332    PT Stop Time 1405    PT Time Calculation (min) 33 min    Activity Tolerance Patient tolerated treatment well    Behavior During Therapy [facility] for tasks assessed/performed           Past Medical History:  Diagnosis Date  . Borderline osteopenia    prior D[facility]A at [facility]   . Cancer ([place])    Melanoma removed from right hip  . Colon polyp 2009   next one due 2012  . 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 History of kidney stones   . Hypothyroidism   . Kidney stones   . Neutropenia    no infections,  bone marrow biopsy by [name] next week  . renal calculi    s/p lithotripsy, [place]  . Thyroid disease    hypothyroidism    Past Surgical History:  Procedure Laterality Date  . COLONOSCOPY  03/23/2010   Diverticulosis  . COLONOSCOPY W/ BIOPSIES  03/14/2006   Tubular adenoma of the rectum x2, upper lesion with focal high-grade dysplasia.  Polyps 9 mm in size.  Marland Kitchen COLONOSCOPY WITH PROPOFOL N/A 10/21/2018   Procedure: COLONOSCOPY WITH PROPOFOL;  Surgeon: Robert Bellow, MD;  Location: [facility] ENDOSCOPY;  Service: Endoscopy;  Laterality: N/A;  . DILATION AND CURETTAGE OF UTERUS    . JOINT REPLACEMENT  2005   done in [place]  . kidney stones removal    . MELANOMA EXCISION  06/29/2016  . right hip replacement    . SHOULDER ARTHROSCOPY WITH OPEN ROTATOR CUFF REPAIR Left 12/01/2018   Procedure:  SHOULDER ARTHROSCOPY WITH OPEN ROTATOR CUFF REPAIR;  Surgeon: Corky Mull, MD;  Location: [facility] ORS;  Service: Orthopedics;  Laterality: Left;    There were no vitals filed for this visit.   Subjective Assessment - 05/24/21 1333    Subjective L calf is better. R hip is good. Gets sore after PT. No pain in hip. Does not have the original pain that she had before. Just the muscles. 0/10 R hip pain at most for the past seven days. Out of town next week.    Pertinent History R hip pain. Pulled L gastroc yesterday doing an exercise demonstration. Had a steroid injectino 2 weeks ago which helped. Had L THA posterior approach 2002. Pt states that she felt like her L hip was popping out. Pt states being very actvie. When pt stands up a lot into full hip extension, pt feels R posterior hip pain. Pt does a lot of impact stff. 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 X-ray was negative for abdnormalities or changes. Might have to do an MRI if the pain returns.    Patient Stated Goals Not to have a revision for R hip and be able to do her actiities without pain.  Currently in Pain? No/denies    Pain Onset More than a month ago                                     PT Education - 05/24/21 1338    Education Details ther-ex    Person(s) Educated Patient    Methods Explanation;Demonstration;Tactile cues;Verbal cues    Comprehension Returned demonstration;Verbalized understanding            Objective   TTP R greater trochatner and R piriformis area  (-) Obers test (knee straight and bent) R. Did not go past neutral adductino hpowever secondary to hx of THA posterior approach. (+) long sit test suggesting anterior nutation R innominate. (+) step down test R L, no pain but decreased femoral control   MedbridgeAccess Code [email or web address]  Therapeutic exercise   SLS with mini squat on R LE with L hip abduction with L foot on furniture slider 10x3  R LE  golfer's pick up 10x3  Side step 32 ft to the R and 32 ft to the L yellow band around ankles   Prone glute max extension  R 10x5 seconds, then 10x2 with 5 lbs ankle weight    L 10x5 seconds   Total Gym Level 19 to progress difficulty. RLE single leg squat. Focus on motor control of keeping R knee in neutral position. 10x2  Then height 21 for 10x  Forward step up onto Bosu ball without UE assist               R LE 10x3  Lateral step up onto bosu ball 10x3  SLS hip stability 3 KG ball toss on rebounder. 2x20 reps.   Reviewed plan of care: possible graduation the week she returns from her trip if patient continues to do well with her R hip.     Improved exercise technique, movement at target joints, use of target muscles after mod verbal, visual, tactile cues.  Response to treatment Pt tolerated session well without aggravation of symptoms.   Clinical impression Pt making good progress with decreased R hip pain based on subjective reports with worst pain being 0/10 for the past 7 days. Continued working on glute max, and med strengthening as well as femoral control to continue progress. Pt tolerated session well without aggravation of symptoms. Pt will benefit from continued skilled physical therapy services to decrease pain, improve strength and function.        PT Short Term Goals - 05/24/21 1347      PT SHORT TERM GOAL #1   Title Patient will be independent with her initial HEP to decrease pain, improve strength, function, ability to hike with less discomfort.    Baseline Pt has started her initial HEP (05/01/2021); Pt performing her HEP, no questions (05/24/2021)    Time 3    Period Weeks    Status Achieved    Target Date 05/24/21             PT Long Term Goals - 05/01/21 1648      PT LONG TERM GOAL #1   Title Patient will have a decrease in R hip pain to 1/10 or less at most to promote ability to perform transfers, hike up a hill more  comfortably.    Baseline 6/10 R hip pain at most for the past month (05/01/2021)    Time 8    Period Weeks  Status New    Target Date 06/28/21      PT LONG TERM GOAL #2   Title Pt will improve R hip extension and abduction strength by at least 1/2 MMT grade to promote femoral control as well as improve ability to perform activities such as hiking up hills more comfortably.    Baseline R hip extension 4-/5, R hip abduction 4/5 (05/01/2021)    Time 8    Period Weeks    Status New    Target Date 06/28/21      PT LONG TERM GOAL #3   Title Pt will improve her FOTO score by at least 10 points as a demonstration of improved function.    Baseline Hip FOTO 69 (05/01/2021)    Time 8    Period Weeks    Status New    Target Date 06/28/21                 Plan - 05/24/21 1338    Clinical Impression Statement Pt making good progress with decreased R hip pain based on subjective reports with worst pain being 0/10 for the past 7 days. Continued working on glute max, and med strengthening as well as femoral control to continue progress. Pt tolerated session well without aggravation of symptoms. Pt will benefit from continued skilled physical therapy services to decrease pain, improve strength and function.    Personal Factors and Comorbidities Comorbidity 3+    Comorbidities Hx of CA, R THA, borderline osteopenia    Examination-Activity Limitations Transfers;Stairs    Stability/Clinical Decision Making Stable/Uncomplicated    Clinical Decision Making Low    Rehab Potential Fair    PT Frequency 2x / week    PT Duration 8 weeks    PT Treatment/Interventions Therapeutic activities;Therapeutic exercise;Neuromuscular re-education;Patient/family education;Manual techniques;Dry needling;Aquatic Therapy;Electrical Stimulation;Iontophoresis 58m/ml Dexamethasone    PT Next Visit Plan hip strengthening, femoral control, manual techniques, modalities PRN    PT Home Exercise Plan Medbridge Access Code  [email or web address]    Consulted and Agree with Plan of Care Patient           Patient will benefit from skilled therapeutic intervention in order to improve the following deficits and impairments:  Pain,Postural dysfunction,Improper body mechanics,Decreased strength  Visit Diagnosis: Trochanteric bursitis of right hip  Pain in right hip     Problem List Patient Active Problem List   Diagnosis Date Noted  . Trochanteric bursitis, right hip 03/25/2021  . Major depressive disorder with current active episode 09/26/2020  . Dystrophic nail 02/10/2020  . S/P shoulder surgery 07/09/2019  . Insomnia due to stress 07/09/2019  . Post-menopausal atrophic vaginitis 03/10/2019  . Cough productive of clear sputum 03/10/2019  . Abnormal blood level of cobalt 06/27/2018  . 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 Melanoma of skin ([place] 07/27/2015  . History of total right hip arthroplasty 02/09/2015  . Osteoporosis 05/25/2014  . 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 History of nephrolithiasis 07/13/2013  . Adverse drug reaction 06/29/2013  . History of adenomatous polyp of colon 06/01/2013  . Hemochromatosis carrier   . Thrombocytopenia ([place] 05/07/2013  . Initial Medicare annual wellness visit 05/07/2013  . Hypothyroidism 09/25/2011  . History of idiopathic seizure 09/25/2011  . Screening for cervical cancer 09/25/2011  . Screening for breast cancer 09/25/2011  . Neutropenia ([facility]     MJoneen BoersPT, DPT   05/24/2021, 2:14 PM  [facility] [address]  [place], [place], [address] Phone: [phone]   Fax:  [phone]  Name: Alexis Mcdonald MRN: [id number] Date of Birth: 1951/07/30

## 2021-05-29 ENCOUNTER — Ambulatory Visit: Payer: Medicare Other

## 2021-05-31 ENCOUNTER — Ambulatory Visit: Payer: Medicare Other

## 2021-06-04 ENCOUNTER — Telehealth: Payer: Self-pay | Admitting: Internal Medicine

## 2021-06-04 NOTE — Telephone Encounter (Signed)
PT called in to advise that they tested positive for covid. They would like to know what they can do to get to feeling better as well as if they are eligible for the monoclonal treatments.

## 2021-06-04 NOTE — Telephone Encounter (Signed)
Spoke with pt and she stated that she tested positive for covid-19 on 06/03/2021 with symptoms starting on 05/31/2021. Pt's symptoms are sore throat, chest and head congestion, fatigue, and productive cough. Pt stated that she is taking mucinex, ibuprofen, zinc BID, and 2,000 IUS vitamin D3. Pt has been scheduled with Sharyn Lull for tomorrow at 4:30 pm.

## 2021-06-05 ENCOUNTER — Encounter: Payer: Self-pay | Admitting: Adult Health

## 2021-06-05 ENCOUNTER — Telehealth (INDEPENDENT_AMBULATORY_CARE_PROVIDER_SITE_OTHER): Payer: Medicare Other | Admitting: Adult Health

## 2021-06-05 ENCOUNTER — Ambulatory Visit: Payer: Medicare Other

## 2021-06-05 VITALS — Ht 65.0 in | Wt 115.0 lb

## 2021-06-05 DIAGNOSIS — U071 COVID-19: Secondary | ICD-10-CM | POA: Insufficient documentation

## 2021-06-05 DIAGNOSIS — J029 Acute pharyngitis, unspecified: Secondary | ICD-10-CM | POA: Diagnosis not present

## 2021-06-05 DIAGNOSIS — R1310 Dysphagia, unspecified: Secondary | ICD-10-CM | POA: Diagnosis not present

## 2021-06-05 HISTORY — DX: COVID-19: U07.1

## 2021-06-05 MED ORDER — ACETAMINOPHEN ER 650 MG PO TBCR: 1300.0000 mg | EXTENDED_RELEASE_TABLET | Freq: Three times a day (TID) | ORAL | 0 refills | Status: DC | PRN

## 2021-06-05 MED ORDER — AZITHROMYCIN 250 MG PO TABS: ORAL_TABLET | ORAL | 0 refills | Status: AC

## 2021-06-05 MED ORDER — PREDNISONE 10 MG (21) PO TBPK: ORAL_TABLET | ORAL | 0 refills | Status: DC

## 2021-06-05 NOTE — Progress Notes (Signed)
Virtual Visit via Video Note  I connected with Alexis Mcdonald on 06/05/21 at  4:30 PM EDT by a video enabled telemedicine application and verified that I am speaking with the correct person using two identifiers. Parties involved in visit as below:   Location: Patient: at home Provider: Provider: Provider's office at  [facility], [place].    I discussed the limitations of evaluation and management by telemedicine and the availability of in person appointments. The patient expressed understanding and agreed to proceed.  History of Present Illness: Patient started with symptoms on 05/31/2021 with sore throat initially. She feels symptoms have increased with " severe " sore throat and denies any swallowing problems. She does have painful swallowing. She tested positive on late Saturday 06/02/2021.   She reports now she is having increased chest congestion, mild cough.  She is having some clear/ yellow phlegm she has coughed up a couple of times.  She has fatigue.   Patient  denies any fever, body aches,chills, rash, chest pain, shortness of breath, nausea, vomiting, or diarrhea.  Denies dizziness, lightheadedness, pre syncopal or syncopal episodes.  Former smoker.  She denies any problems functioning across the house.    Observations/Objective:  Patient is alert and oriented and responsive to questions Engages in conversation with provider. Speaks in full sentences without any pauses without any shortness of breath or distress.   Assessment and Plan:  1. COVID-19 Seems to be doing well. Obtain pulse oximetry if able.   2. Sore throat/ painful swallowing.  Salt water gargles advised TID.  Will cover strep has Penicillin allergy.   Meds ordered this encounter  Medications  . predniSONE (STERAPRED UNI-PAK 21 TAB) 10 MG (21) TBPK tablet    Sig: PO: Take 6 tablets on day 1:Take 5 tablets day 2:Take 4 tablets day 3: Take 3 tablets day 4:Take  2 tablets day five: 5 Take 1 tablet day 6    Dispense:  21 tablet    Refill:  0  . azithromycin (ZITHROMAX) 250 MG tablet    Sig: Take 2 tablets on day 1, then 1 tablet daily on days 2 through 5    Dispense:  6 tablet    Refill:  0  . acetaminophen (TYLENOL 8 HOUR ARTHRITIS PAIN) 650 MG CR tablet    Sig: Take 2 tablets (1,300 mg total) by mouth every 8 (eight) hours as needed for pain.    Dispense:  30 tablet    Refill:  0  she is advised to take tylenol only as needed and take breaks to be sure she is afebrile.   Advised in person evaluation at anytime is advised if any symptoms do not improve, worsen or change at any given time.  Red Flags discussed. The patient was given clear instructions to go to ER or return to medical center if any red flags develop, symptoms do not improve, worsen or new problems develop. They verbalized understanding.    Follow Up Instructions:    I discussed the assessment and treatment plan with the patient. The patient was provided an opportunity to ask questions and all were answered. The patient agreed with the plan and demonstrated an understanding of the instructions.   The patient was advised to call back or seek an in-person evaluation if the symptoms worsen or if the condition fails to improve as anticipated.   Return in about 3 days (around 06/08/2021), or if symptoms worsen or fail to improve, for at any  time for any worsening symptoms, Go to Emergency room/ urgent care if worse.    Marcille Buffy, FNP

## 2021-06-05 NOTE — Patient Instructions (Signed)
Pharyngitis  Pharyngitis is a sore throat (pharynx). This is when there is redness, pain, and swelling in your throat. Most of the time, this condition gets better on its own. In some cases, you may need medicine. Follow these instructions at home:  Take over-the-counter and prescription medicines only as told by your doctor. ? If you were prescribed an antibiotic medicine, take it as told by your doctor. Do not stop taking the antibiotic even if you start to feel better. ? Do not give children aspirin. Aspirin has been linked to Reye syndrome.  Drink enough water and fluids to keep your pee (urine) clear or pale yellow.  Get a lot of rest.  Rinse your mouth (gargle) with a salt-water mixture 3-4 times a day or as needed. To make a salt-water mixture, completely dissolve -1 tsp of salt in 1 cup of warm water.  If your doctor approves, you may use throat lozenges or sprays to soothe your throat. Contact a doctor if:  You have large, tender lumps in your neck.  You have a rash.  You cough up green, yellow-brown, or bloody spit. Get help right away if:  You have a stiff neck.  You drool or cannot swallow liquids.  You cannot drink or take medicines without throwing up.  You have very bad pain that does not go away with medicine.  You have problems breathing, and it is not from a stuffy nose.  You have new pain and swelling in your knees, ankles, wrists, or elbows. Summary  Pharyngitis is a sore throat (pharynx). This is when there is redness, pain, and swelling in your throat.  If you were prescribed an antibiotic medicine, take it as told by your doctor. Do not stop taking the antibiotic even if you start to feel better.  Most of the time, pharyngitis gets better on its own. Sometimes, you may need medicine. This information is not intended to replace advice given to you by your health care provider. Make sure you discuss any questions you have with your health care  provider. Document Revised: 11/28/2017 Document Reviewed: 01/21/2017 Elsevier Patient Education  2021 [place]: What to Do if You Are Sick If you have a fever, cough or other symptoms, you might have COVID-19. Most people have mild illness and are able to recover at home. If you are sick:  Keep track of your symptoms.  If you have an emergency warning sign (including trouble breathing), call 911. Steps to help prevent the spread of COVID-19 if you are sick If you are sick with COVID-19 or think you might have COVID-19, follow the steps below to care for yourself and to help protect other people in your home and community. Stay home except to get medical care  Stay home. Most people with COVID-19 have mild illness and can recover at home without medical care. Do not leave your home, except to get medical care. Do not visit public areas.  Take care of yourself. Get rest and stay hydrated. Take over-the-counter medicines, such as acetaminophen, to help you feel better.  Stay in touch with your doctor. Call before you get medical care. Be sure to get care if you have trouble breathing, or have any other emergency warning signs, or if you think it is an emergency.  Avoid public transportation, ride-sharing, or taxis. Separate yourself from other people As much as possible, stay in a specific room and away from other people and pets in your home. If possible, you  should use a separate bathroom. If you need to be around other people or animals in or outside of the home, wear a mask. Tell your close contactsthat they may have been exposed to COVID-19. An infected person can spread COVID-19 starting 48 hours (or 2 days) before the person has any symptoms or tests positive. By letting your close contacts know they may have been exposed to COVID-19, you are helping to protect everyone.  Additional guidance is available for those living in close quarters and shared housing.  See COVID-19  and Animals if you have questions about pets.  If you are diagnosed with COVID-19, someone from the health department may call you. Answer the call to slow the spread. Monitor your symptoms  Symptoms of COVID-19 include fever, cough, or other symptoms.  Follow care instructions from your healthcare provider and local health department. Your local health authorities may give instructions on checking your symptoms and reporting information. When to seek emergency medical attention Look for emergency warning signs* for COVID-19. If someone is showing any of these signs, seek emergency medical care immediately:  Trouble breathing  Persistent pain or pressure in the chest  New confusion  Inability to wake or stay awake  Pale, gray, or blue-colored skin, lips, or nail beds, depending on skin tone *This list is not all possible symptoms. Please call your medical provider for any other symptoms that are severe or concerning to you. Call 911 or call ahead to your local emergency facility: Notify the operator that you are seeking care for someone who has or may have COVID-19. Call ahead before visiting your doctor  Call ahead. Many medical visits for routine care are being postponed or done by phone or telemedicine.  If you have a medical appointment that cannot be postponed, call your doctor's office, and tell them you have or may have COVID-19. This will help the office protect themselves and other patients. Get  tested  If you have symptoms of COVID-19, get tested. While waiting for test results, you stay away from others, including staying apart from those living in your household.  You can visit your state, tribal, local, and territorialhealth department's website to look for the latest local information on testing sites. If you are sick, wear a mask over your nose and mouth  You should wear a mask over your nose and mouth if you must be around other people or animals, including pets  (even at home).  You don't need to wear the mask if you are alone. If you can't put on a mask (because of trouble breathing, for example), cover your coughs and sneezes in some other way. Try to stay at least 6 feet away from other people. This will help protect the people around you.  Masks should not be placed on young children under age 67 years, anyone who has trouble breathing, or anyone who is not able to remove the mask without help. Note: During the COVID-19 pandemic, medical grade facemasks are reserved for healthcare workers and some first responders. Cover your coughs and sneezes  Cover your mouth and nose with a tissue when you cough or sneeze.  Throw away used tissues in a lined trash can.  Immediately wash your hands with soap and water for at least 20 seconds. If soap and water are not available, clean your hands with an alcohol-based hand sanitizer that contains at least 60% alcohol. Clean your hands often  Wash your hands often with soap and water for at  least 20 seconds. This is especially important after blowing your nose, coughing, or sneezing; going to the bathroom; and before eating or preparing food.  Use hand sanitizer if soap and water are not available. Use an alcohol-based hand sanitizer with at least 60% alcohol, covering all surfaces of your hands and rubbing them together until they feel dry.  Soap and water are the best option, especially if hands are visibly dirty.  Avoid touching your eyes, nose, and mouth with unwashed hands.  Handwashing Tips Avoid sharing personal household items  Do not share dishes, drinking glasses, cups, eating utensils, towels, or bedding with other people in your home.  Wash these items thoroughly after using them with soap and water or put in the dishwasher. Clean all "high-touch" surfaces everyday  Clean and disinfect high-touch surfaces in your "sick room" and bathroom; wear disposable gloves. Let someone else clean and  disinfect surfaces in common areas, but you should clean your bedroom and bathroom, if possible.  If a caregiver or other person needs to clean and disinfect a sick person's bedroom or bathroom, they should do so on an as-needed basis. The caregiver/other person should wear a mask and disposable gloves prior to cleaning. They should wait as long as possible after the person who is sick has used the bathroom before coming in to clean and use the bathroom. ? High-touch surfaces include phones, remote controls, counters, tabletops, doorknobs, bathroom fixtures, toilets, keyboards, tablets, and bedside tables.  Clean and disinfect areas that may have blood, stool, or body fluids on them.  Use household cleaners and disinfectants. Clean the area or item with soap and water or another detergent if it is dirty. Then, use a household disinfectant. ? Be sure to follow the instructions on the label to ensure safe and effective use of the product. Many products recommend keeping the surface wet for several minutes to ensure germs are killed. Many also recommend precautions such as wearing gloves and making sure you have good ventilation during use of the product. ? Use a product from [employer]: Disinfectants for Coronavirus (GEXBM-84). ? Complete Disinfection Guidance When you can be around others after being sick with COVID-19 Deciding when you can be around others is different for different situations. Find out when you can safely end home isolation. For any additional questions about your care, contact your healthcare provider or state or local health department. 03/15/2020 Content source: [facility] ([facility]), Division of Viral Diseases This information is not intended to replace advice given to you by your health care provider. Make sure you discuss any questions you have with your health care provider. Document Revised: 10/30/2020 Document Reviewed:  10/30/2020 Elsevier Patient Education  2021 [place]. Prednisone tablets What is this medicine? PREDNISONE (PRED ni sone) is a corticosteroid. It is commonly used to treat inflammation of the skin, joints, lungs, and other organs. Common conditions treated include asthma, allergies, and arthritis. It is also used for other conditions, such as blood disorders and diseases of the adrenal glands. This medicine may be used for other purposes; ask your health care provider or pharmacist if you have questions. COMMON BRAND NAME(S): Deltasone, Predone, Sterapred, Sterapred DS What should I tell my health care provider before I take this medicine? They need to know if you have any of these conditions:  Cushing's syndrome  diabetes  glaucoma  heart disease  high blood pressure  infection (especially a virus infection such as chickenpox, cold sores,  or herpes)  kidney disease  liver disease  mental illness  myasthenia gravis  osteoporosis  seizures  stomach or intestine problems  thyroid disease  an unusual or allergic reaction to lactose, prednisone, other medicines, foods, dyes, or preservatives  pregnant or trying to get pregnant  breast-feeding How should I use this medicine? Take this medicine by mouth with a glass of water. Follow the directions on the prescription label. Take this medicine with food. If you are taking this medicine once a day, take it in the morning. Do not take more medicine than you are told to take. Do not suddenly stop taking your medicine because you may develop a severe reaction. Your doctor will tell you how much medicine to take. If your doctor wants you to stop the medicine, the dose may be slowly lowered over time to avoid any side effects. Talk to your pediatrician regarding the use of this medicine in children. Special care may be needed. Overdosage: If you think you have taken too much of this medicine contact a poison control center or  emergency room at once. NOTE: This medicine is only for you. Do not share this medicine with others. What if I miss a dose? If you miss a dose, take it as soon as you can. If it is almost time for your next dose, talk to your doctor or health care professional. You may need to miss a dose or take an extra dose. Do not take double or extra doses without advice. What may interact with this medicine? Do not take this medicine with any of the following medications:  metyrapone  mifepristone This medicine may also interact with the following medications:  aminoglutethimide  amphotericin B  aspirin and aspirin-like medicines  barbiturates  certain medicines for diabetes, like glipizide or glyburide  cholestyramine  cholinesterase inhibitors  cyclosporine  digoxin  diuretics  ephedrine  female hormones, like estrogens and birth control pills  isoniazid  ketoconazole  NSAIDS, medicines for pain and inflammation, like ibuprofen or naproxen  phenytoin  rifampin  toxoids  vaccines  warfarin This list may not describe all possible interactions. Give your health care provider a list of all the medicines, herbs, non-prescription drugs, or dietary supplements you use. Also tell them if you smoke, drink alcohol, or use illegal drugs. Some items may interact with your medicine. What should I watch for while using this medicine? Visit your doctor or health care professional for regular checks on your progress. If you are taking this medicine over a prolonged period, carry an identification card with your name and address, the type and dose of your medicine, and your doctor's name and address. This medicine may increase your risk of getting an infection. Tell your doctor or health care professional if you are around anyone with measles or chickenpox, or if you develop sores or blisters that do not heal properly. If you are going to have surgery, tell your doctor or health care  professional that you have taken this medicine within the last twelve months. Ask your doctor or health care professional about your diet. You may need to lower the amount of salt you eat. This medicine may increase blood sugar. Ask your healthcare provider if changes in diet or medicines are needed if you have diabetes. What side effects may I notice from receiving this medicine? Side effects that you should report to your doctor or health care professional as soon as possible:  allergic reactions like skin rash, itching or hives,  swelling of the face, lips, or tongue  changes in emotions or moods  changes in vision  depressed mood  eye pain  fever or chills, cough, sore throat, pain or difficulty passing urine  signs and symptoms of high blood sugar such as being more thirsty or hungry or having to urinate more than normal. You may also feel very tired or have blurry vision.  swelling of ankles, feet Side effects that usually do not require medical attention (report to your doctor or health care professional if they continue or are bothersome):  confusion, excitement, restlessness  headache  nausea, vomiting  skin problems, acne, thin and shiny skin  trouble sleeping  weight gain This list may not describe all possible side effects. Call your doctor for medical advice about side effects. You may report side effects to FDA at 1-800-FDA-1088. Where should I keep my medicine? Keep out of the reach of children. Store at room temperature between 15 and 30 degrees C (59 and 86 degrees F). Protect from light. Keep container tightly closed. Throw away any unused medicine after the expiration date. NOTE: This sheet is a summary. It may not cover all possible information. If you have questions about this medicine, talk to your doctor, pharmacist, or health care provider.  2021 Elsevier/Gold Standard (2018-09-15 10:54:22) Azithromycin tablets What is this medicine? AZITHROMYCIN (az  ith roe MYE sin) is a macrolide antibiotic. It is used to treat or prevent certain kinds of bacterial infections. It will not work for colds, flu, or other viral infections. This medicine may be used for other purposes; ask your health care provider or pharmacist if you have questions. COMMON BRAND NAME(S): Zithromax, Zithromax Tri-Pak, Zithromax Z-Pak What should I tell my health care provider before I take this medicine? They need to know if you have any of these conditions:  history of blood diseases, like leukemia  history of irregular heartbeat  kidney disease  liver disease  myasthenia gravis  an unusual or allergic reaction to azithromycin, erythromycin, other macrolide antibiotics, foods, dyes, or preservatives  pregnant or trying to get pregnant  breast-feeding How should I use this medicine? Take this medicine by mouth with a full glass of water. Follow the directions on the prescription label. The tablets can be taken with food or on an empty stomach. If the medicine upsets your stomach, take it with food. Take your medicine at regular intervals. Do not take your medicine more often than directed. Take all of your medicine as directed even if you think your are better. Do not skip doses or stop your medicine early. Talk to your pediatrician regarding the use of this medicine in children. While this drug may be prescribed for children as young as 6 months for selected conditions, precautions do apply. Overdosage: If you think you have taken too much of this medicine contact a poison control center or emergency room at once. NOTE: This medicine is only for you. Do not share this medicine with others. What if I miss a dose? If you miss a dose, take it as soon as you can. If it is almost time for your next dose, take only that dose. Do not take double or extra doses. What may interact with this medicine? Do not take this medicine with any of the following  medications:  cisapride  dronedarone  pimozide  thioridazine This medicine may also interact with the following medications:  antacids that contain aluminum or magnesium  birth control pills  colchicine  cyclosporine  digoxin  ergot alkaloids like dihydroergotamine, ergotamine  nelfinavir  other medicines that prolong the QT interval (an abnormal heart rhythm)  phenytoin  warfarin This list may not describe all possible interactions. Give your health care provider a list of all the medicines, herbs, non-prescription drugs, or dietary supplements you use. Also tell them if you smoke, drink alcohol, or use illegal drugs. Some items may interact with your medicine. What should I watch for while using this medicine? Tell your doctor or healthcare provider if your symptoms do not start to get better or if they get worse. This medicine may cause serious skin reactions. They can happen weeks to months after starting the medicine. Contact your healthcare provider right away if you notice fevers or flu-like symptoms with a rash. The rash may be red or purple and then turn into blisters or peeling of the skin. Or, you might notice a red rash with swelling of the face, lips or lymph nodes in your neck or under your arms. Do not treat diarrhea with over the counter products. Contact your doctor if you have diarrhea that lasts more than 2 days or if it is severe and watery. This medicine can make you more sensitive to the sun. Keep out of the sun. If you cannot avoid being in the sun, wear protective clothing and use sunscreen. Do not use sun lamps or tanning beds/booths. What side effects may I notice from receiving this medicine? Side effects that you should report to your doctor or health care professional as soon as possible:  allergic reactions like skin rash, itching or hives, swelling of the face, lips, or tongue  bloody or watery diarrhea  breathing problems  chest  pain  fast, irregular heartbeat  muscle weakness  rash, fever, and swollen lymph nodes  redness, blistering, peeling, or loosening of the skin, including inside the mouth  signs and symptoms of liver injury like dark yellow or brown urine; general ill feeling or flu-like symptoms; light-colored stools; loss of appetite; nausea; right upper belly pain; unusually weak or tired; yellowing of the eyes or skin  white patches or sores in the mouth  unusually weak or tired Side effects that usually do not require medical attention (report to your doctor or health care professional if they continue or are bothersome):  diarrhea  nausea  stomach pain  vomiting This list may not describe all possible side effects. Call your doctor for medical advice about side effects. You may report side effects to FDA at 1-800-FDA-1088. Where should I keep my medicine? Keep out of the reach of children. Store at room temperature between 15 and 30 degrees C (59 and 86 degrees F). Throw away any unused medicine after the expiration date. NOTE: This sheet is a summary. It may not cover all possible information. If you have questions about this medicine, talk to your doctor, pharmacist, or health care provider.  2021 Elsevier/Gold Standard (2019-03-25 17:19:20)

## 2021-06-05 NOTE — Telephone Encounter (Signed)
Noted  

## 2021-06-07 ENCOUNTER — Ambulatory Visit: Payer: Medicare Other

## 2021-06-12 ENCOUNTER — Other Ambulatory Visit: Payer: Self-pay

## 2021-06-12 ENCOUNTER — Ambulatory Visit: Payer: Medicare Other | Attending: Student

## 2021-06-12 DIAGNOSIS — M25551 Pain in right hip: Secondary | ICD-10-CM | POA: Diagnosis not present

## 2021-06-12 DIAGNOSIS — M7061 Trochanteric bursitis, right hip: Secondary | ICD-10-CM

## 2021-06-12 NOTE — Therapy (Signed)
[facility] [address], [place], [address] Phone: [phone]   Fax:  [phone]  Physical Therapy Treatment  Patient Details  Name: Alexis Mcdonald MRN: [id number] Date of Birth: February 06, 1951 Referring Provider (PT): Lattie Corns, [place]   Encounter Date: 06/12/2021   PT End of Session - 06/12/21 1424     Visit Number 6    Number of Visits 17    Date for PT Re-Evaluation 06/28/21    Authorization Type 6    Authorization Time Period 10    PT Start Time 5009    PT Stop Time 1507    PT Time Calculation (min) 43 min    Activity Tolerance Patient tolerated treatment well    Behavior During Therapy [facility] for tasks assessed/performed             Past Medical History:  Diagnosis Date   Borderline osteopenia    prior DEXA at [facility])    Melanoma removed from right hip   Colon polyp 2009   next one due 2012   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  Polyps 9 mm in size.   COLONOSCOPY WITH PROPOFOL N/A 10/21/2018   Procedure: COLONOSCOPY WITH PROPOFOL;  Surgeon: Robert Bellow, MD;  Location: [facility] ENDOSCOPY;  Service: Endoscopy;  Laterality: N/A;   DILATION AND CURETTAGE OF UTERUS     JOINT REPLACEMENT  2005   done in [place]   kidney stones removal     MELANOMA EXCISION  06/29/2016   right hip replacement     SHOULDER ARTHROSCOPY WITH OPEN ROTATOR CUFF REPAIR Left 12/01/2018   Procedure: SHOULDER  ARTHROSCOPY WITH OPEN ROTATOR CUFF REPAIR;  Surgeon: Corky Mull, MD;  Location: [facility] ORS;  Service: Orthopedics;  Laterality: Left;    There were no vitals filed for this visit.   Subjective Assessment - 06/12/21 1425     Subjective Feeling better from COVID but has low energy. Had R hip pain at area of bursitis and lower yesterday.  Might be due to the virus. No discomfort today. Did a long yoga session earlier and that did not hurt. 2/10 R hip pain at most for the past 7 days. Feels like she could graduate PT today. Started to do some of the exercises at home again. R hip was fine before the trip.    Pertinent History R hip pain. Pulled L gastroc yesterday doing an exercise demonstration. Had a steroid injectino 2 weeks ago which helped. Had L THA posterior approach 2002. Pt states that she felt like her L hip was popping out. Pt states being very actvie. When pt stands up a lot into full hip extension, pt feels R posterior hip pain. Pt does a lot of impact stff. 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 X-ray was negative for abdnormalities or changes. Might have  to do an MRI if the pain returns.    Patient Stated Goals Not to have a revision for R hip and be able to do her actiities without pain.    Currently in Pain? No/denies    Pain Onset More than a month ago                                       PT Education - 06/12/21 1439     Education Details ther-ex    Person(s) Educated Patient    Methods Explanation;Demonstration;Tactile cues;Verbal cues    Comprehension Returned demonstration;Verbalized understanding            Objective     TTP R greater trochatner and R piriformis area   (-) Obers test (knee straight and bent) R. Did not go past neutral adductino hpowever secondary to hx of THA posterior approach. (+) long sit test suggesting anterior nutation R innominate. (+) step down test R L, no pain but decreased femoral control    Medbridge Access Code [id number]   Therapeutic exercise   Manually resisted hip extension, S/L hip abduction   Hip extension 5/5 R, 4/5 L; hip abduction 5/5 R, 5/5 L  R LE golfer's pick up 10x, then 3x R hip soreness free ROM  Seated hip extension isometrics  R 10x10 seconds    SLS with mini squat on R LE with L hip abduction with L foot on furniture slider 10x3    Forward step up onto Bosu ball without UE assist               R LE 10x3   Lateral step up onto bosu ball 10x3 R LE  SLS hip stability 3 KG ball toss with PT throwing ball in different angles. 20x, then 13x, then 7x reps   Walking lunges  32 ft forward x 2 then backward x 2  Side step with squat 32 ft to the R and 32 ft to the L   Reviewed plan of care: continue with HEP and return for follow up visit at week of 06/28/2021 to see how pt maintains progress with her HEP. Pt verbalized understanding.       Improved exercise technique, movement at target joints, use of target muscles after mod verbal, visual, tactile cues.    Response to treatment Pt tolerated session well without aggravation of symptoms.   Clinical impression  Pt demonstrates overall decreased R hip pain as well as strength and function since initial evaluation. Slight increase in R hip symptoms since her trip but minimal in which COVID may play a factor. Continued working on R hip strengthening and control to continue progress. Pt to continue with her HEP and return for a follow up visit at week of 06/28/2021 to see how her hip does with the exercises. Pt tolerated session well without aggravation of symptoms. Pt will benefit from continued skilled physical therapy services to decrease pain, improve strength and function.        PT Short Term Goals - 05/24/21 1347       PT SHORT TERM GOAL #1   Title Patient will be independent with her initial HEP to decrease pain, improve strength, function, ability to hike with less discomfort.    Baseline Pt  has started her initial HEP (05/01/2021); Pt performing her HEP, no questions (05/24/2021)    Time 3    Period  Weeks    Status Achieved    Target Date 05/24/21               PT Long Term Goals - 06/12/21 1435       PT LONG TERM GOAL #1   Title Patient will have a decrease in R hip pain to 1/10 or less at most to promote ability to perform transfers, hike up a hill more comfortably.    Baseline 6/10 R hip pain at most for the past month (05/01/2021); 2/10 (06/12/2021)    Time 8    Period Weeks    Status Partially Met    Target Date 06/28/21      PT LONG TERM GOAL #2   Title Pt will improve R hip extension and abduction strength by at least 1/2 MMT grade to promote femoral control as well as improve ability to perform activities such as hiking up hills more comfortably.    Baseline R hip extension 4-/5, R hip abduction 4/5 (05/01/2021);Hip extension 5/5 R, 4/5 L; hip abduction 5/5 R, 5/5 L    Time 8    Period Weeks    Status Achieved    Target Date 06/28/21      PT LONG TERM GOAL #3   Title Pt will improve her FOTO score by at least 10 points as a demonstration of improved function.    Baseline Hip FOTO 69 (05/01/2021); 73 (06/12/2021)    Time 8    Period Weeks    Status Partially Met    Target Date 06/28/21                   Plan - 06/12/21 1439     Clinical Impression Statement Pt demonstrates overall decreased R hip pain as well as strength and function since initial evaluation. Slight increase in R hip symptoms since her trip but minimal in which COVID may play a factor. Continued working on R hip strengthening and control to continue progress. Pt to continue with her HEP and return for a follow up visit at week of 06/28/2021 to see how her hip does with the exercises. Pt tolerated session well without aggravation of symptoms. Pt will benefit from continued skilled physical therapy services to decrease pain, improve strength and function.    Personal Factors and  Comorbidities Comorbidity 3+    Comorbidities Hx of CA, R THA, borderline osteopenia    Examination-Activity Limitations Transfers;Stairs    Stability/Clinical Decision Making Stable/Uncomplicated    Rehab Potential Fair    PT Frequency 2x / week    PT Duration 8 weeks    PT Treatment/Interventions Therapeutic activities;Therapeutic exercise;Neuromuscular re-education;Patient/family education;Manual techniques;Dry needling;Aquatic Therapy;Electrical Stimulation;Iontophoresis 32m/ml Dexamethasone    PT Next Visit Plan hip strengthening, femoral control, manual techniques, modalities PRN    PT Home Exercise Plan Medbridge Access Code [email or web address]    Consulted and Agree with Plan of Care Patient             Patient will benefit from skilled therapeutic intervention in order to improve the following deficits and impairments:  Pain, Postural dysfunction, Improper body mechanics, Decreased strength  Visit Diagnosis: Trochanteric bursitis of right hip  Pain in right hip     Problem List Patient Active Problem List   Diagnosis Date Noted   COVID-19 06/05/2021   Sore throat 06/05/2021   Swallowing painful 06/05/2021   Trochanteric bursitis, right hip 03/25/2021   Major depressive disorder with current active episode 09/26/2020   Dystrophic nail 02/10/2020  S/P shoulder surgery 07/09/2019   Insomnia due to stress 07/09/2019   Post-menopausal atrophic vaginitis 03/10/2019   Cough productive of clear sputum 03/10/2019   Abnormal blood level of cobalt 06/27/2018   Tinnitus of left ear 04/14/2016   Melanoma of skin ([place]) 07/27/2015   History of total right hip arthroplasty 02/09/2015   Osteoporosis 05/25/2014   BCC (basal cell carcinoma), ear 12/13/2013   History of nephrolithiasis 07/13/2013   Adverse drug reaction 06/29/2013   History of adenomatous polyp of colon 06/01/2013   Hemochromatosis carrier    Thrombocytopenia ([place]) 05/07/2013   Initial Medicare annual wellness visit  05/07/2013   Hypothyroidism 09/25/2011   History of idiopathic seizure 09/25/2011   Screening for cervical cancer 09/25/2011   Screening for breast cancer 09/25/2011   Neutropenia ([place])     Joneen Boers PT, DPT  06/12/2021, 3:17 PM  [facility] [address], [place], [address] Phone: [phone]   Fax:  [phone]  Name: Dru Laurel MRN: [id number] Date of Birth: Dec 17, 1951

## 2021-06-28 ENCOUNTER — Ambulatory Visit: Payer: Medicare Other

## 2021-06-28 DIAGNOSIS — M25551 Pain in right hip: Secondary | ICD-10-CM

## 2021-06-28 DIAGNOSIS — F418 Other specified anxiety disorders: Secondary | ICD-10-CM | POA: Diagnosis not present

## 2021-06-28 DIAGNOSIS — M7061 Trochanteric bursitis, right hip: Secondary | ICD-10-CM

## 2021-06-28 NOTE — Therapy (Signed)
[facility] [address] [place], [place], [address] Phone: [phone]   Fax:  [phone]  Physical Therapy Screen And Discharge Summary  Patient Details  Name: Alexis Mcdonald MRN: [id number] Date of Birth: 01-18-1951 Referring Provider (PT): Lattie Corns, [place]   Encounter Date: 06/28/2021   PT End of Session - 06/28/21 1150     Visit Number 7    Number of Visits 17    Date for PT Re-Evaluation 06/28/21    Authorization Type 7    Authorization Time Period 10    PT Start Time 9735    PT Stop Time 1156    PT Time Calculation (min) 5 min    Activity Tolerance Patient tolerated treatment well    Behavior During Therapy [facility] for tasks assessed/performed             Past Medical History:  Diagnosis Date   Borderline osteopenia    prior DEXA at [facility])    Melanoma removed from right hip   Colon polyp 2009   next one due 2012   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  Polyps 9 mm in size.   COLONOSCOPY WITH PROPOFOL N/A 10/21/2018   Procedure: COLONOSCOPY WITH PROPOFOL;  Surgeon: Robert Bellow, MD;  Location: [facility] ENDOSCOPY;  Service: Endoscopy;  Laterality: N/A;   DILATION AND CURETTAGE OF UTERUS     JOINT REPLACEMENT  2005   done in [place]   kidney stones removal     MELANOMA EXCISION  06/29/2016   right hip replacement     SHOULDER ARTHROSCOPY WITH OPEN ROTATOR CUFF REPAIR Left 12/01/2018    Procedure: SHOULDER ARTHROSCOPY WITH OPEN ROTATOR CUFF REPAIR;  Surgeon: Corky Mull, MD;  Location: [facility] ORS;  Service: Orthopedics;  Laterality: Left;    There were no vitals filed for this visit.   Subjective Assessment - 06/28/21 1152     Subjective R hip is great. No pain currently. 0/10 R hip pain at most for the past 7 days. Does not feel like she needs to do PT today because she is doing well. Targeting the piroformis helps.    Pertinent History R hip pain. Pulled L gastroc yesterday doing an exercise demonstration. Had a steroid injectino 2 weeks ago which helped. Had L THA posterior approach 2002. Pt states that she felt like her L hip was popping out. Pt states being very actvie. When pt stands up a lot into full hip extension, pt feels R posterior hip pain. Pt does a lot of impact stff. 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 X-ray was negative for abdnormalities or changes. Might have to do an MRI if the pain returns.    Patient Stated Goals Not to have a revision for R hip and be able to do her actiities without pain.    Currently  in Pain? No/denies    Pain Onset More than a month ago                                        Objective      Medbridge Access Code [id number]   Therapeutic exercise   Exercises not performed secondary to pt doing well at home, no questions with her home program, and no complain of pain for the past 7 days.    Response to treatment Pt tolerated session well without aggravation of symptoms.   Clinical impression  Pt returns to PT for a follow up with her HEP to see if she is able to maintain or improve progress. Pt returns to PT without complain of pain with 0/10 at most for the past 7 days. Pt also demonstrates independence and consistency with her HEP. Skilled physical therapy services discharged with pt continuing with her progress with her exercises at home.         PT Short Term Goals -  05/24/21 1347       PT SHORT TERM GOAL #1   Title Patient will be independent with her initial HEP to decrease pain, improve strength, function, ability to hike with less discomfort.    Baseline Pt has started her initial HEP (05/01/2021); Pt performing her HEP, no questions (05/24/2021)    Time 3    Period Weeks    Status Achieved    Target Date 05/24/21               PT Long Term Goals - 06/28/21 1153       PT LONG TERM GOAL #1   Title Patient will have a decrease in R hip pain to 1/10 or less at most to promote ability to perform transfers, hike up a hill more comfortably.    Baseline 6/10 R hip pain at most for the past month (05/01/2021); 2/10 (06/12/2021); 0/10 (06/28/2021)    Time 8    Period Weeks    Status Achieved    Target Date 06/28/21      PT LONG TERM GOAL #2   Title Pt will improve R hip extension and abduction strength by at least 1/2 MMT grade to promote femoral control as well as improve ability to perform activities such as hiking up hills more comfortably.    Baseline R hip extension 4-/5, R hip abduction 4/5 (05/01/2021);Hip extension 5/5 R, 4/5 L; hip abduction 5/5 R, 5/5 L    Time 8    Period Weeks    Status Achieved    Target Date 06/28/21      PT LONG TERM GOAL #3   Title Pt will improve her FOTO score by at least 10 points as a demonstration of improved function.    Baseline Hip FOTO 69 (05/01/2021); 73 (06/12/2021); 91 (06/28/2021)    Time 8    Period Weeks    Status Achieved    Target Date 06/28/21                   Plan - 06/28/21 1209     Clinical Impression Statement Pt returns to PT for a follow up with her HEP to see if she is able to maintain or improve progress. Pt returns to PT without complain of pain with 0/10 at most for the past 7 days. Pt also demonstrates independence and consistency with her HEP.  Skilled physical therapy services discharged with pt continuing with her progress with her exercises at home.    Personal Factors and  Comorbidities Comorbidity 3+    Comorbidities Hx of CA, R THA, borderline osteopenia    PT Treatment/Interventions Neuromuscular re-education;Patient/family education;Manual techniques;Therapeutic activities;Therapeutic exercise    PT Next Visit Plan Continue progress with her HEP.    PT Home Exercise Plan Medbridge Access Code [email or web address]    Consulted and Agree with Plan of Care Patient             Patient will benefit from skilled therapeutic intervention in order to improve the following deficits and impairments:     Visit Diagnosis: Trochanteric bursitis of right hip  Pain in right hip     Problem List Patient Active Problem List   Diagnosis Date Noted   COVID-19 06/05/2021   Sore throat 06/05/2021   Swallowing painful 06/05/2021   Trochanteric bursitis, right hip 03/25/2021   Major depressive disorder with current active episode 09/26/2020   Dystrophic nail 02/10/2020   S/P shoulder surgery 07/09/2019   Insomnia due to stress 07/09/2019   Post-menopausal atrophic vaginitis 03/10/2019   Cough productive of clear sputum 03/10/2019   Abnormal blood level of cobalt 06/27/2018   Tinnitus of left ear 04/14/2016   Melanoma of skin ([place]) 07/27/2015   History of total right hip arthroplasty 02/09/2015   Osteoporosis 05/25/2014   BCC (basal cell carcinoma), ear 12/13/2013   History of nephrolithiasis 07/13/2013   Adverse drug reaction 06/29/2013   History of adenomatous polyp of colon 06/01/2013   Hemochromatosis carrier    Thrombocytopenia ([place]) 05/07/2013   Initial Medicare annual wellness visit 05/07/2013   Hypothyroidism 09/25/2011   History of idiopathic seizure 09/25/2011   Screening for cervical cancer 09/25/2011   Screening for breast cancer 09/25/2011   Neutropenia ([facility])    Thank you for your referral.  Joneen Boers PT, DPT   06/28/2021, 12:19 PM  [facility] [address],  [place], [address] Phone: [phone]   Fax:  [phone]  Name: Manila Rommel MRN: [id number] Date of Birth: 03-09-1951

## 2021-08-23 DIAGNOSIS — H43813 Vitreous degeneration, bilateral: Secondary | ICD-10-CM | POA: Diagnosis not present

## 2021-08-28 DIAGNOSIS — D2271 Melanocytic nevi of right lower limb, including hip: Secondary | ICD-10-CM | POA: Diagnosis not present

## 2021-08-28 DIAGNOSIS — Z86006 Personal history of melanoma in-situ: Secondary | ICD-10-CM | POA: Diagnosis not present

## 2021-08-28 DIAGNOSIS — L0102 Bockhart's impetigo: Secondary | ICD-10-CM | POA: Diagnosis not present

## 2021-08-28 DIAGNOSIS — Z85828 Personal history of other malignant neoplasm of skin: Secondary | ICD-10-CM | POA: Diagnosis not present

## 2021-08-28 DIAGNOSIS — D485 Neoplasm of uncertain behavior of skin: Secondary | ICD-10-CM | POA: Diagnosis not present

## 2021-08-28 DIAGNOSIS — L570 Actinic keratosis: Secondary | ICD-10-CM | POA: Diagnosis not present

## 2021-08-28 DIAGNOSIS — Z85820 Personal history of malignant melanoma of skin: Secondary | ICD-10-CM | POA: Diagnosis not present

## 2021-08-28 DIAGNOSIS — D225 Melanocytic nevi of trunk: Secondary | ICD-10-CM | POA: Diagnosis not present

## 2021-08-28 DIAGNOSIS — X32XXXA Exposure to sunlight, initial encounter: Secondary | ICD-10-CM | POA: Diagnosis not present

## 2021-09-20 ENCOUNTER — Other Ambulatory Visit: Payer: Self-pay | Admitting: Internal Medicine

## 2021-09-27 DIAGNOSIS — F418 Other specified anxiety disorders: Secondary | ICD-10-CM | POA: Diagnosis not present

## 2021-10-15 DIAGNOSIS — Z23 Encounter for immunization: Secondary | ICD-10-CM | POA: Diagnosis not present

## 2021-11-15 DIAGNOSIS — F418 Other specified anxiety disorders: Secondary | ICD-10-CM | POA: Diagnosis not present

## 2021-11-20 DIAGNOSIS — C44311 Basal cell carcinoma of skin of nose: Secondary | ICD-10-CM | POA: Diagnosis not present

## 2021-11-20 DIAGNOSIS — D485 Neoplasm of uncertain behavior of skin: Secondary | ICD-10-CM | POA: Diagnosis not present

## 2021-12-10 ENCOUNTER — Encounter: Payer: Self-pay | Admitting: Internal Medicine

## 2021-12-10 ENCOUNTER — Other Ambulatory Visit: Payer: Self-pay | Admitting: Internal Medicine

## 2021-12-10 DIAGNOSIS — Z1231 Encounter for screening mammogram for malignant neoplasm of breast: Secondary | ICD-10-CM

## 2021-12-13 DIAGNOSIS — F418 Other specified anxiety disorders: Secondary | ICD-10-CM | POA: Diagnosis not present

## 2021-12-17 ENCOUNTER — Telehealth: Payer: Self-pay | Admitting: Internal Medicine

## 2021-12-17 NOTE — Telephone Encounter (Signed)
Patient  called in still need meds to be filled and  called pharmacy and they stated they have not heard from Dr Derrel Nip office. Patient is about to run out of medication . Patient has 4 day supply left . [phone] is the best number to reach patient

## 2021-12-18 ENCOUNTER — Encounter: Payer: Self-pay | Admitting: Internal Medicine

## 2021-12-18 MED ORDER — LEVOTHYROXINE SODIUM 88 MCG PO TABS: 88.0000 ug | ORAL_TABLET | Freq: Every day | ORAL | 1 refills | Status: DC

## 2021-12-18 NOTE — Telephone Encounter (Signed)
Pt calling in again about refill on medication (levothyroxine (SYNTHROID) 88 MCG tablet). Pt stated that she only have 3 pills left. Pt requesting callback

## 2021-12-18 NOTE — Telephone Encounter (Signed)
LMTCB

## 2021-12-18 NOTE — Telephone Encounter (Signed)
Pt called back and was advised medication was called in. Pt would still like a call back from Alexis Mcdonald

## 2021-12-19 NOTE — Telephone Encounter (Signed)
Called pt back and she stated that she was finally able to get her medication filled yesterday.

## 2022-01-15 NOTE — Telephone Encounter (Signed)
error 

## 2022-01-17 DIAGNOSIS — F418 Other specified anxiety disorders: Secondary | ICD-10-CM | POA: Diagnosis not present

## 2022-01-21 ENCOUNTER — Other Ambulatory Visit: Payer: Self-pay

## 2022-01-21 ENCOUNTER — Ambulatory Visit
Admission: RE | Admit: 2022-01-21 | Discharge: 2022-01-21 | Disposition: A | Discharge status: Home / Self Care | Payer: Medicare Other | Source: Ambulatory Visit | Attending: Internal Medicine | Admitting: Internal Medicine

## 2022-01-21 DIAGNOSIS — Z1231 Encounter for screening mammogram for malignant neoplasm of breast: Secondary | ICD-10-CM | POA: Diagnosis not present

## 2022-02-07 DIAGNOSIS — M950 Acquired deformity of nose: Secondary | ICD-10-CM | POA: Diagnosis not present

## 2022-02-07 DIAGNOSIS — L578 Other skin changes due to chronic exposure to nonionizing radiation: Secondary | ICD-10-CM | POA: Diagnosis not present

## 2022-02-07 DIAGNOSIS — I781 Nevus, non-neoplastic: Secondary | ICD-10-CM | POA: Diagnosis not present

## 2022-02-07 DIAGNOSIS — L814 Other melanin hyperpigmentation: Secondary | ICD-10-CM | POA: Diagnosis not present

## 2022-02-07 DIAGNOSIS — C44311 Basal cell carcinoma of skin of nose: Secondary | ICD-10-CM | POA: Diagnosis not present

## 2022-02-07 DIAGNOSIS — L988 Other specified disorders of the skin and subcutaneous tissue: Secondary | ICD-10-CM | POA: Diagnosis not present

## 2022-02-12 ENCOUNTER — Ambulatory Visit: Payer: Medicare Other

## 2022-02-21 DIAGNOSIS — Z20822 Contact with and (suspected) exposure to covid-19: Secondary | ICD-10-CM | POA: Diagnosis not present

## 2022-03-04 ENCOUNTER — Ambulatory Visit (INDEPENDENT_AMBULATORY_CARE_PROVIDER_SITE_OTHER): Payer: Medicare Other

## 2022-03-04 VITALS — Ht 65.0 in | Wt 115.0 lb

## 2022-03-04 DIAGNOSIS — Z0000 Encounter for general adult medical examination without abnormal findings: Secondary | ICD-10-CM | POA: Diagnosis not present

## 2022-03-04 NOTE — Progress Notes (Addendum)
Subjective:   Alexis Mcdonald is a 70 y.o. female who presents for Medicare Annual (Subsequent) preventive examination.  Review of Systems    No ROS.  Medicare Wellness Virtual Visit.  Visual/audio telehealth visit, UTA vital signs.   See social history for additional risk factors.   Cardiac Risk Factors include: advanced age (>38men, >47 women)     Objective:    Today's Vitals   03/04/22 1408  Weight: 115 lb (52.2 kg)  Height: 5\' 5"  (1.651 m)   Body mass index is 19.14 kg/m.  Advanced Directives 03/04/2022 04/09/2021 02/09/2021 02/09/2020 02/04/2019 12/01/2018 11/24/2018  Does Patient Have a Medical Advance Directive? Yes Yes Yes Yes Yes Yes Yes  Type of Estate agent of [place];Living will Healthcare Power of [place];Living will Healthcare Power of [place];Living will Healthcare Power of [place];Living will Healthcare Power of [place];Living will Healthcare Power of [place];Living will Healthcare Power of [place];Living will  Does patient want to make changes to medical advance directive? No - Patient declined Yes (ED - Information included in AVS) No - Patient declined No - Patient declined No - Patient declined No - Patient declined No - Patient declined  Copy of Healthcare Power of Attorney in Chart? Yes - validated most recent copy scanned in chart (See row information) - Yes - validated most recent copy scanned in chart (See row information) Yes - validated most recent copy scanned in chart (See row information) No - copy requested No - copy requested No - copy requested    Current Medications (verified) Outpatient Encounter Medications as of 03/04/2022  Medication Sig   acetaminophen (TYLENOL 8 HOUR ARTHRITIS PAIN) 650 MG CR tablet Take 2 tablets (1,300 mg total) by mouth every 8 (eight) hours as needed for pain.   ALPRAZolam (XANAX) 0.25 MG tablet Take 0.5 tablets (0.125 mg total) by mouth at bedtime as needed for sleep.   cholecalciferol  (VITAMIN D3) 25 MCG (1000 UNIT) tablet Take 1,000 Units by mouth daily.   fexofenadine-pseudoephedrine (ALLEGRA-D) 60-120 MG 12 hr tablet Take 1 tablet by mouth daily as needed (allergies).   fish oil-omega-3 fatty acids 1000 MG capsule Take 1 g by mouth daily.    levothyroxine (SYNTHROID) 88 MCG tablet Take 1 tablet (88 mcg total) by mouth daily before breakfast.   nicotine polacrilex (NICORETTE) 2 MG gum Take 2 mg by mouth as needed for smoking cessation.   psyllium (REGULOID) 0.52 g capsule Take 0.52 g by mouth daily.   [DISCONTINUED] predniSONE (STERAPRED UNI-PAK 21 TAB) 10 MG (21) TBPK tablet PO: Take 6 tablets on day 1:Take 5 tablets day 2:Take 4 tablets day 3: Take 3 tablets day 4:Take 2 tablets day five: 5 Take 1 tablet day 6   [DISCONTINUED] venlafaxine XR (EFFEXOR-XR) 37.5 MG 24 hr capsule Take 1 capsule (37.5 mg total) by mouth daily with breakfast.   No facility-administered encounter medications on file as of 03/04/2022.    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  Polyps 9 mm in size.   COLONOSCOPY WITH PROPOFOL N/A 10/21/2018   Procedure: COLONOSCOPY WITH PROPOFOL;  Surgeon: Earline Mayotte, MD;  Location: [facility] ENDOSCOPY;  Service: Endoscopy;   Laterality: N/A;   DILATION AND CURETTAGE OF UTERUS     JOINT REPLACEMENT  2005   done in [place]   kidney stones removal     MELANOMA EXCISION  06/29/2016   right hip replacement     SHOULDER ARTHROSCOPY WITH OPEN ROTATOR CUFF REPAIR Left 12/01/2018   Procedure: SHOULDER ARTHROSCOPY WITH OPEN ROTATOR CUFF REPAIR;  Surgeon: Christena Flake, MD;  Location: [facility] ORS;  Service: Orthopedics;  Laterality: Left;   Family History  Problem Relation Age of Onset   Diabetes Mother    Stroke Mother    Hyperlipidemia Mother    Hypertension Mother    Alcohol abuse Father    Diabetes Brother    Alcohol abuse Brother    Hypertension Brother    Alcohol abuse Brother    Heart disease Maternal Uncle    Breast cancer Neg Hx    Social History   Socioeconomic History   Marital status: Divorced    Spouse name: Not on file   Number of children: 2   Years of education: Not on file   Highest education level: Not on file  Occupational History   Occupation: Child psychotherapist    Employer: [employer]  Tobacco Use   Smoking status: Former    Types: Cigarettes    Quit date: 09/24/2000    Years since quitting: 21.4   Smokeless tobacco: Never  Vaping Use   Vaping Use: Never used  Substance and Sexual Activity   Alcohol use: Yes    Comment: "weekends"   Drug use: No   Sexual activity: Yes    Birth control/protection: None  Other Topics Concern   Not on file  Social History Narrative   Patient lived most her her life in [place] and moved to [place] to help care for aging mother   Social Determinants of Health   Financial Resource Strain: Low Risk    Difficulty of Paying Living Expenses: Not hard at all  Food Insecurity: No Food Insecurity   Worried About Programme researcher, broadcasting/film/video in the Last Year: Never true   Barista in the Last Year: Never true  Transportation Needs: No Transportation Needs   Lack of Transportation (Medical): No   Lack of Transportation (Non-Medical): No  Physical Activity:  Sufficiently Active   Days of Exercise per Week: 7 days   Minutes of Exercise per Session: 60 min  Stress: No Stress Concern Present   Feeling of Stress : Not at all  Social Connections: Unknown   Frequency of Communication with Friends and Family: More than three times a week   Frequency of Social Gatherings with Friends and Family: More than three times a week   Attends Religious Services: 1 to 4 times per year   Active Member of [employer] or Organizations: Yes   Attends Banker Meetings: 1 to 4 times per year   Marital Status: Not on file  Tobacco Counseling Counseling given: Not Answered   Clinical Intake:  Pre-visit preparation completed: Yes        Diabetes: No  How often do you need to have someone help you when you read instructions, pamphlets, or other written materials from your doctor or pharmacy?: 1 - Never  Interpreter Needed?: No      Activities of Daily Living In your present state of health, do you have any difficulty performing the following activities: 03/04/2022  Hearing? N  Vision? N  Difficulty concentrating or making decisions? N  Walking or climbing stairs? N  Dressing or bathing? N  Doing errands, shopping? N  Preparing Food and eating ? N  Using the Toilet? N  In the past six months, have you accidently leaked urine? N  Do you have problems with loss of bowel control? N  Managing your Medications? N  Managing your Finances? N  Housekeeping or managing your Housekeeping? N  Some recent data might be hidden    Patient Care Team: Sherlene Shams, MD as PCP - General (Internal Medicine) Kieth Brightly, MD (General Surgery)  Indicate any recent Medical Services you may have received from other than [facility] providers in the past year (date may be approximate).     Assessment:   This is a routine wellness examination for Alexis Mcdonald.  Virtual Visit via Telephone Note  I connected with  Alexis Mcdonald on  03/04/22 at  2:00 PM EST by telephone and verified that I am speaking with the correct person using two identifiers.  Persons participating in the virtual visit: patient/Nurse Health Advisor   I discussed the limitations, risks, security and privacy concerns of performing an evaluation and management service by telephone and the availability of in person appointments. The patient expressed understanding and agreed to proceed.  Interactive audio and video telecommunications were attempted between this nurse and patient, however failed, due to patient having technical difficulties OR patient did not have access to video capability.  We continued and completed visit with audio only.  Some vital signs may be absent or patient reported.   Hearing/Vision screen Hearing Screening - Comments:: Hearing loss L ear  Tinnitus  No hearing aids Vision Screening - Comments:: Wears corrective lenses They have seen their ophthalmologist.   Dietary issues and exercise activities discussed: Current Exercise Habits: Structured exercise class, Type of exercise: yoga;strength training/weights;stretching;calisthenics (Water aerobics), Time (Minutes): 60, Frequency (Times/Week): 7, Weekly Exercise (Minutes/Week): 420, Intensity: Mild Regular diet Good water intake   Goals Addressed               This Visit's Progress     Patient Stated     Maintain Healthy Lifestyle (pt-stated)   On track     Continue personal physical activity Healthy diet       Depression Screen PHQ 2/9 Scores 03/04/2022 06/05/2021 02/09/2021 09/26/2020 02/09/2020 09/28/2019 02/04/2019  PHQ - 2 Score 0 0 0 6 0 0 0  PHQ- 9 Score - - - 11 - - -  Exception Documentation - - (No Data) - - - -    Fall Risk Fall Risk  03/04/2022 06/05/2021 03/23/2021 02/09/2021 09/26/2020  Falls in the past year? 0 0 0 0 1  Number falls in past yr: 0 0 0 0 0  Injury with Fall? - 0 0 0 0  Risk for fall due to : - - History of fall(s) - -  Follow up Falls  evaluation completed Falls evaluation completed Falls evaluation  completed Falls evaluation completed Falls evaluation completed    FALL RISK PREVENTION PERTAINING TO THE HOME: Home free of loose throw rugs in walkways, pet beds, electrical cords, etc? Yes  Adequate lighting in your home to reduce risk of falls? Yes   ASSISTIVE DEVICES UTILIZED TO PREVENT FALLS: Use of a cane, walker or w/c? No   TIMED UP AND GO: Was the test performed? No .   Cognitive Function:  Patient is alert and oriented x3.    6CIT Screen 03/04/2022 02/09/2020 02/04/2019  What Year? 0 points 0 points 0 points  What month? 0 points 0 points 0 points  What time? 0 points 0 points 0 points  Count back from 20 0 points 0 points 0 points  Months in reverse 0 points 0 points 0 points  Repeat phrase 0 points 0 points 0 points  Total Score 0 0 0   Immunizations Immunization History  Administered Date(s) Administered   Influenza, High Dose Seasonal PF 10/14/2019   Influenza,inj,Quad PF,6+ Mos 09/24/2014, 10/10/2015, 08/20/2016   Influenza-Unspecified 09/29/2012, 09/29/2017, 09/08/2018, 09/18/2020   PFIZER(Purple Top)SARS-COV-2 Vaccination 02/10/2020, 03/02/2020, 09/27/2020   PNEUMOCOCCAL CONJUGATE-20 03/23/2021   Pneumococcal Conjugate-13 09/29/2017   Pneumococcal Polysaccharide-23 02/04/2019   Tdap 05/07/2013   Zoster Recombinat (Shingrix) 01/13/2022   Zoster, Live 06/10/2013   Covid-19 vaccine status: Completed vaccines x3.  Shingrix vaccine- agrees to update immunization record upon completion of series. .   Screening Tests Health Maintenance  Topic Date Due   Hepatitis C Screening  Never done   COVID-19 Vaccine (4 - Booster for Pfizer series) 03/20/2022 (Originally 11/22/2020)   Zoster Vaccines- Shingrix (2 of 2) 03/10/2022   MAMMOGRAM  01/21/2023   TETANUS/TDAP  05/08/2023   COLONOSCOPY (Pts 45-28yrs Insurance coverage will need to be confirmed)  10/21/2028   Pneumonia Vaccine 29+ Years old   Completed   INFLUENZA VACCINE  Completed   DEXA SCAN  Completed   HPV VACCINES  Aged Out   Health Maintenance Health Maintenance Due  Topic Date Due   Hepatitis C Screening  Never done   Hepatitis C Screening:  consent given for future.   Vision Screening: Recommended annual ophthalmology exams for early detection of glaucoma and other disorders of the eye.  Dental Screening: Recommended annual dental exams for proper oral hygiene  Community Resource Referral / Chronic Care Management: CRR required this visit?  No   CCM required this visit?  No      Plan:   Keep all routine maintenance appointments.   I have personally reviewed and noted the following in the patient's chart:   Medical and social history Use of alcohol, tobacco or illicit drugs  Current medications and supplements including opioid prescriptions.  Functional ability and status Nutritional status Physical activity Advanced directives List of other physicians Hospitalizations, surgeries, and ER visits in previous 12 months Vitals Screenings to include cognitive, depression, and falls Referrals and appointments  In addition, I have reviewed and discussed with patient certain preventive protocols, quality metrics, and best practice recommendations. A written personalized care plan for preventive services as well as general preventive health recommendations were provided to patient via mychart.     OBrien-Blaney, Devell Parkerson L, LPN   01/04/1095        I have reviewed the above information and agree with above.   Duncan Dull, MD

## 2022-03-04 NOTE — Patient Instructions (Addendum)
[name] , Thank you for taking time to come for your Medicare Wellness Visit. I appreciate your ongoing commitment to your health goals. Please review the following plan we discussed and let me know if I can assist you in the future.   These are the goals we discussed:  Goals       Patient Stated     Maintain Healthy Lifestyle (pt-stated)      Continue personal physical activity Healthy diet        This is a list of the screening recommended for you and due dates:  Health Maintenance  Topic Date Due   Hepatitis C Screening: USPSTF Recommendation to screen - Ages 11-79 yo.  Never done   COVID-19 Vaccine (4 - Booster for Pfizer series) 03/20/2022*   Zoster (Shingles) Vaccine (2 of 2) 03/10/2022   Mammogram  01/21/2023   Tetanus Vaccine  05/08/2023   Colon Cancer Screening  10/21/2028   Pneumonia Vaccine  Completed   Flu Shot  Completed   DEXA scan (bone density measurement)  Completed   HPV Vaccine  Aged Out  *Topic was postponed. The date shown is not the original due date.    Advanced directives: on file  Conditions/risks identified: none new  Follow up in one year for your annual wellness visit    Preventive Care 65 Years and Older, Female Preventive care refers to lifestyle choices and visits with your health care provider that can promote health and wellness. What does preventive care include? A yearly physical exam. This is also called an annual well check. Dental exams once or twice a year. Routine eye exams. Ask your health care provider how often you should have your eyes checked. Personal lifestyle choices, including: Daily care of your teeth and gums. Regular physical activity. Eating a healthy diet. Avoiding tobacco and drug use. Limiting alcohol use. Practicing safe sex. Taking low-dose aspirin every day. Taking vitamin and mineral supplements as recommended by your health care provider. What happens during an annual well check? The  services and screenings done by your health care provider during your annual well check will depend on your age, overall health, lifestyle risk factors, and family history of disease. Counseling  Your health care provider may ask you questions about your: Alcohol use. Tobacco use. Drug use. Emotional well-being. Home and relationship well-being. Sexual activity. Eating habits. History of falls. Memory and ability to understand (cognition). Work and work Statistician. Reproductive health. Screening  You may have the following tests or measurements: Height, weight, and BMI. Blood pressure. Lipid and cholesterol levels. These may be checked every 5 years, or more frequently if you are over 33 years old. Skin check. Lung cancer screening. You may have this screening every year starting at age 23 if you have a 30-pack-year history of smoking and currently smoke or have quit within the past 15 years. Fecal occult blood test (FOBT) of the stool. You may have this test every year starting at age 32. Flexible sigmoidoscopy or colonoscopy. You may have a sigmoidoscopy every 5 years or a colonoscopy every 10 years starting at age 92. Hepatitis C blood test. Hepatitis B blood test. Sexually transmitted disease (STD) testing. Diabetes screening. This is done by checking your blood sugar (glucose) after you have not eaten for a while (fasting). You may have this done every 1-3 years. Bone density scan. This is done to screen for osteoporosis. You may have this done starting at age 68. Mammogram. This may be done  every 1-2 years. Talk to your health care provider about how often you should have regular mammograms. Talk with your health care provider about your test results, treatment options, and if necessary, the need for more tests. Vaccines  Your health care provider may recommend certain vaccines, such as: Influenza vaccine. This is recommended every year. Tetanus, diphtheria, and acellular  pertussis (Tdap, Td) vaccine. You may need a Td booster every 10 years. Zoster vaccine. You may need this after age 27. Pneumococcal 13-valent conjugate (PCV13) vaccine. One dose is recommended after age 44. Pneumococcal polysaccharide (PPSV23) vaccine. One dose is recommended after age 57. Talk to your health care provider about which screenings and vaccines you need and how often you need them. This information is not intended to replace advice given to you by your health care provider. Make sure you discuss any questions you have with your health care provider. Document Released: 01/12/2016 Document Revised: 09/04/2016 Document Reviewed: 10/17/2015 Elsevier Interactive Patient Education  2017 [place] Prevention in the Home Falls can cause injuries. They can happen to people of all ages. There are many things you can do to make your home safe and to help prevent falls. What can I do on the outside of my home? Regularly fix the edges of walkways and driveways and fix any cracks. Remove anything that might make you trip as you walk through a door, such as a raised step or threshold. Trim any bushes or trees on the path to your home. Use bright outdoor lighting. Clear any walking paths of anything that might make someone trip, such as rocks or tools. Regularly check to see if handrails are loose or broken. Make sure that both sides of any steps have handrails. Any raised decks and porches should have guardrails on the edges. Have any leaves, snow, or ice cleared regularly. Use sand or salt on walking paths during winter. Clean up any spills in your garage right away. This includes oil or grease spills. What can I do in the bathroom? Use night lights. Install grab bars by the toilet and in the tub and shower. Do not use towel bars as grab bars. Use non-skid mats or decals in the tub or shower. If you need to sit down in the shower, use a plastic, non-slip stool. Keep the floor  dry. Clean up any water that spills on the floor as soon as it happens. Remove soap buildup in the tub or shower regularly. Attach bath mats securely with double-sided non-slip rug tape. Do not have throw rugs and other things on the floor that can make you trip. What can I do in the bedroom? Use night lights. Make sure that you have a light by your bed that is easy to reach. Do not use any sheets or blankets that are too big for your bed. They should not hang down onto the floor. Have a firm chair that has side arms. You can use this for support while you get dressed. Do not have throw rugs and other things on the floor that can make you trip. What can I do in the kitchen? Clean up any spills right away. Avoid walking on wet floors. Keep items that you use a lot in easy-to-reach places. If you need to reach something above you, use a strong step stool that has a grab bar. Keep electrical cords out of the way. Do not use floor polish or wax that makes floors slippery. If you must use wax, use  non-skid floor wax. Do not have throw rugs and other things on the floor that can make you trip. What can I do with my stairs? Do not leave any items on the stairs. Make sure that there are handrails on both sides of the stairs and use them. Fix handrails that are broken or loose. Make sure that handrails are as long as the stairways. Check any carpeting to make sure that it is firmly attached to the stairs. Fix any carpet that is loose or worn. Avoid having throw rugs at the top or bottom of the stairs. If you do have throw rugs, attach them to the floor with carpet tape. Make sure that you have a light switch at the top of the stairs and the bottom of the stairs. If you do not have them, ask someone to add them for you. What else can I do to help prevent falls? Wear shoes that: Do not have high heels. Have rubber bottoms. Are comfortable and fit you well. Are closed at the toe. Do not wear  sandals. If you use a stepladder: Make sure that it is fully opened. Do not climb a closed stepladder. Make sure that both sides of the stepladder are locked into place. Ask someone to hold it for you, if possible. Clearly mark and make sure that you can see: Any grab bars or handrails. First and last steps. Where the edge of each step is. Use tools that help you move around (mobility aids) if they are needed. These include: Canes. Walkers. Scooters. Crutches. Turn on the lights when you go into a dark area. Replace any light bulbs as soon as they burn out. Set up your furniture so you have a clear path. Avoid moving your furniture around. If any of your floors are uneven, fix them. If there are any pets around you, be aware of where they are. Review your medicines with your doctor. Some medicines can make you feel dizzy. This can increase your chance of falling. Ask your doctor what other things that you can do to help prevent falls. This information is not intended to replace advice given to you by your health care provider. Make sure you discuss any questions you have with your health care provider. Document Released: 10/12/2009 Document Revised: 05/23/2016 Document Reviewed: 01/20/2015 Elsevier Interactive Patient Education  2017 [employer].

## 2022-03-07 DIAGNOSIS — F418 Other specified anxiety disorders: Secondary | ICD-10-CM | POA: Diagnosis not present

## 2022-03-21 DIAGNOSIS — Z86006 Personal history of melanoma in-situ: Secondary | ICD-10-CM | POA: Diagnosis not present

## 2022-03-21 DIAGNOSIS — D2262 Melanocytic nevi of left upper limb, including shoulder: Secondary | ICD-10-CM | POA: Diagnosis not present

## 2022-03-21 DIAGNOSIS — D225 Melanocytic nevi of trunk: Secondary | ICD-10-CM | POA: Diagnosis not present

## 2022-03-21 DIAGNOSIS — D2261 Melanocytic nevi of right upper limb, including shoulder: Secondary | ICD-10-CM | POA: Diagnosis not present

## 2022-03-21 DIAGNOSIS — L821 Other seborrheic keratosis: Secondary | ICD-10-CM | POA: Diagnosis not present

## 2022-03-21 DIAGNOSIS — X32XXXA Exposure to sunlight, initial encounter: Secondary | ICD-10-CM | POA: Diagnosis not present

## 2022-03-21 DIAGNOSIS — L570 Actinic keratosis: Secondary | ICD-10-CM | POA: Diagnosis not present

## 2022-03-29 ENCOUNTER — Ambulatory Visit (INDEPENDENT_AMBULATORY_CARE_PROVIDER_SITE_OTHER): Payer: Medicare Other | Admitting: Internal Medicine

## 2022-03-29 ENCOUNTER — Encounter: Payer: Self-pay | Admitting: Internal Medicine

## 2022-03-29 VITALS — BP 124/80 | HR 54 | Temp 98.0°F | Ht 65.0 in | Wt 117.0 lb

## 2022-03-29 DIAGNOSIS — E890 Postprocedural hypothyroidism: Secondary | ICD-10-CM | POA: Diagnosis not present

## 2022-03-29 DIAGNOSIS — L659 Nonscarring hair loss, unspecified: Secondary | ICD-10-CM | POA: Diagnosis not present

## 2022-03-29 DIAGNOSIS — D696 Thrombocytopenia, unspecified: Secondary | ICD-10-CM | POA: Diagnosis not present

## 2022-03-29 DIAGNOSIS — E782 Mixed hyperlipidemia: Secondary | ICD-10-CM

## 2022-03-29 DIAGNOSIS — C44311 Basal cell carcinoma of skin of nose: Secondary | ICD-10-CM

## 2022-03-29 DIAGNOSIS — E559 Vitamin D deficiency, unspecified: Secondary | ICD-10-CM | POA: Diagnosis not present

## 2022-03-29 LAB — CBC WITH DIFFERENTIAL/PLATELET
Basophils Absolute: 0 10*3/uL (ref 0.0–0.1)
Basophils Relative: 0.6 % (ref 0.0–3.0)
Eosinophils Absolute: 0 10*3/uL (ref 0.0–0.7)
Eosinophils Relative: 0.6 % (ref 0.0–5.0)
HCT: 39.5 % (ref 36.0–46.0)
Hemoglobin: 13.4 g/dL (ref 12.0–15.0)
Lymphocytes Relative: 52.1 % — ABNORMAL HIGH (ref 12.0–46.0)
Lymphs Abs: 1.6 10*3/uL (ref 0.7–4.0)
MCHC: 33.8 g/dL (ref 30.0–36.0)
MCV: 94.4 fl (ref 78.0–100.0)
Monocytes Absolute: 0.2 10*3/uL (ref 0.1–1.0)
Monocytes Relative: 7.8 % (ref 3.0–12.0)
Neutro Abs: 1.2 10*3/uL — ABNORMAL LOW (ref 1.4–7.7)
Neutrophils Relative %: 38.9 % — ABNORMAL LOW (ref 43.0–77.0)
Platelets: 162 10*3/uL (ref 150.0–400.0)
RBC: 4.18 Mil/uL (ref 3.87–5.11)
RDW: 12.2 % (ref 11.5–15.5)
WBC: 3 10*3/uL — ABNORMAL LOW (ref 4.0–10.5)

## 2022-03-29 LAB — COMPREHENSIVE METABOLIC PANEL
ALT: 12 U/L (ref 0–35)
AST: 18 U/L (ref 0–37)
Albumin: 4.3 g/dL (ref 3.5–5.2)
Alkaline Phosphatase: 60 U/L (ref 39–117)
BUN: 22 mg/dL (ref 6–23)
CO2: 28 mEq/L (ref 19–32)
Calcium: 9 mg/dL (ref 8.4–10.5)
Chloride: 104 mEq/L (ref 96–112)
Creatinine, Ser: 0.67 mg/dL (ref 0.40–1.20)
GFR: 87.84 mL/min (ref >60.00)
Glucose, Bld: 93 mg/dL (ref 70–99)
Potassium: 4.4 mEq/L (ref 3.5–5.1)
Sodium: 138 mEq/L (ref 135–145)
Total Bilirubin: 0.7 mg/dL (ref 0.2–1.2)
Total Protein: 6.1 g/dL (ref 6.0–8.3)

## 2022-03-29 LAB — B12 AND FOLATE PANEL
Folate: >24.2 ng/mL (ref >5.9)
Vitamin B-12: 542 pg/mL (ref 211–911)

## 2022-03-29 LAB — IBC + FERRITIN
Ferritin: 62.5 ng/mL (ref 10.0–291.0)
Iron: 104 ug/dL (ref 42–145)
Saturation Ratios: 33.6 % (ref 20.0–50.0)
TIBC: 309.4 ug/dL (ref 250.0–450.0)
Transferrin: 221 mg/dL (ref 212.0–360.0)

## 2022-03-29 LAB — LIPID PANEL
Cholesterol: 192 mg/dL (ref 0–200)
HDL: 92.2 mg/dL (ref >39.00)
LDL Cholesterol: 89 mg/dL (ref 0–99)
NonHDL: 99.41
Total CHOL/HDL Ratio: 2
Triglycerides: 53 mg/dL (ref 0.0–149.0)
VLDL: 10.6 mg/dL (ref 0.0–40.0)

## 2022-03-29 LAB — VITAMIN D 25 HYDROXY (VIT D DEFICIENCY, FRACTURES): VITD: 46.3 ng/mL (ref 30.00–100.00)

## 2022-03-29 LAB — TSH: TSH: 4.26 u[IU]/mL (ref 0.35–5.50)

## 2022-03-29 MED ORDER — ALPRAZOLAM 0.25 MG PO TABS: 0.1250 mg | ORAL_TABLET | Freq: Every evening | ORAL | 5 refills | Status: AC | PRN

## 2022-03-29 NOTE — Assessment & Plan Note (Addendum)
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   Rechecking today ? ?Lab Results  ?Component Value Date  ? WBC 6.4 04/09/2021  ? HGB 14.0 04/09/2021  ? HCT 41.7 04/09/2021  ? MCV 95.4 04/09/2021  ? PLT 161 04/09/2021  ? ? ?

## 2022-03-29 NOTE — Progress Notes (Signed)
? ?Subjective:  ?Patient ID: Alexis Mcdonald, female    DOB: [birth year]  Age: 71 y.o. MRN: [id number] ? ?CC: The primary encounter diagnosis was Thrombocytopenia ([place]). Diagnoses of Moderate mixed hyperlipidemia not requiring statin therapy, Postoperative hypothyroidism, Loss of hair, Vitamin D deficiency, Thinning hair, and Basal cell carcinoma (BCC) of lateral side wall of nose were also pertinent to this visit. ? ? ?This visit occurred during the SARS-CoV-2 public health emergency.  Safety protocols were in place, including screening questions prior to the visit, additional usage of staff PPE, and extensive cleaning of exam room while observing appropriate contact time as indicated for disinfecting solutions.   ? ?HPI ?Alexis Mcdonald presents for follow up on hypothyroidism and recent development of hair loss  ?Chief Complaint  ?Patient presents with  ? Follow-up  ?  F/u to check thyroid - Levothyroxine increased at last visit. Pt never returned to have levels checked. Pt also c/o loosing hair. Pt had recent Moh's procedure for skin cancer on nose.   ? ?1) hair loss noted for the past  3 months. Diffuse, reported as noticeable diffuse thinning, also notes her eyebrows are thinning and her pubic hair has become more coarse.  Takes an [place], [place]  omegas.  Vegetarian  ? ?2) Hypothyroid : dose increased one  year ago. Did not return for repeat labs.  Exercising regularly using YouTube videos instead of gym  but going out socially and initiating contact .  Has gained 2 lbs,.  Reports  fluctuating energy levels and motivation levels.  Sleeping well.  Rare use of alprazolam , refills expired before she was able to use them.  ? ?3) Moh's procedure on nose 1.5 months ago for a BCC , recovery was complicated by a cyst  which became quite large,   and she required a second procedure  for wider margins.  She has been referred for  dermabrasion  ? ?Outpatient Medications Prior to Visit  ?Medication Sig  Dispense Refill  ? acetaminophen (TYLENOL 8 HOUR ARTHRITIS PAIN) 650 MG CR tablet Take 2 tablets (1,300 mg total) by mouth every 8 (eight) hours as needed for pain. 30 tablet 0  ? cholecalciferol (VITAMIN D3) 25 MCG (1000 UNIT) tablet Take 1,000 Units by mouth daily.    ? fexofenadine-pseudoephedrine (ALLEGRA-D) 60-120 MG 12 hr tablet Take 1 tablet by mouth daily as needed (allergies).    ? fish oil-omega-3 fatty acids 1000 MG capsule Take 1 g by mouth daily.     ? levothyroxine (SYNTHROID) 88 MCG tablet Take 1 tablet (88 mcg total) by mouth daily before breakfast. 90 tablet 1  ? nicotine polacrilex (NICORETTE) 2 MG gum Take 2 mg by mouth as needed for smoking cessation.    ? psyllium (REGULOID) 0.52 g capsule Take 0.52 g by mouth daily.    ? ALPRAZolam (XANAX) 0.25 MG tablet Take 0.5 tablets (0.125 mg total) by mouth at bedtime as needed for sleep. 30 tablet 5  ? ?No facility-administered medications prior to visit.  ? ? ?Review of Systems; ? ?Patient denies headache, fevers, malaise, unintentional weight loss, skin rash, eye pain, sinus congestion and sinus pain, sore throat, dysphagia,  hemoptysis , cough, dyspnea, wheezing, chest pain, palpitations, orthopnea, edema, abdominal pain, nausea, melena, diarrhea, constipation, flank pain, dysuria, hematuria, urinary  Frequency, nocturia, numbness, tingling, seizures,  Focal weakness, Loss of consciousness,  Tremor, insomnia, depression, anxiety, and suicidal ideation.   ? ? ? ?Objective:  ?BP 124/80 (BP Location: Left Arm,  Patient Position: Sitting, Cuff Size: Small)   Pulse (!) 54   Temp 98 ?F (36.7 ?C) (Oral)   Ht 5' 5"  (1.651 m)   Wt 117 lb (53.1 kg)   SpO2 99%   BMI 19.47 kg/m?  ? ?BP Readings from Last 3 Encounters:  ?03/29/22 124/80  ?04/09/21 (!) 141/83  ?03/23/21 120/68  ? ? ?Wt Readings from Last 3 Encounters:  ?03/29/22 117 lb (53.1 kg)  ?03/04/22 115 lb (52.2 kg)  ?06/05/21 115 lb (52.2 kg)  ? ? ?General appearance: alert, cooperative and appears  stated age ?Scalp:  hair follicle pattern normal.  No receding frontal hair line,  female pattern hairline ("inherited from my mother")  ?Ears: normal TM's and external ear canals both ears ?Throat: lips, mucosa, and tongue normal; teeth and gums normal ?Neck: no adenopathy, no carotid bruit, supple, symmetrical, trachea midline and thyroid not enlarged, symmetric, no tenderness/mass/nodules ?Back: symmetric, no curvature. ROM normal. No CVA tenderness. ?Lungs: clear to auscultation bilaterally ?Heart: regular rate and rhythm, S1, S2 normal, no murmur, click, rub or gallop ?Abdomen: soft, non-tender; bowel sounds normal; no masses,  no organomegaly ?Pulses: 2+ and symmetric ?Skin: Skin color, texture, turgor normal. No rashes or lesions ?Lymph nodes: Cervical, supraclavicular, and axillary nodes normal. ? ?No results found for: HGBA1C ? ?Lab Results  ?Component Value Date  ? CREATININE 0.65 04/09/2021  ? CREATININE 0.71 03/23/2021  ? CREATININE 0.71 11/03/2018  ? ? ?Lab Results  ?Component Value Date  ? WBC 6.4 04/09/2021  ? HGB 14.0 04/09/2021  ? HCT 41.7 04/09/2021  ? PLT 161 04/09/2021  ? GLUCOSE 105 (H) 04/09/2021  ? CHOL 208 (H) 03/23/2021  ? TRIG 40.0 03/23/2021  ? HDL 94.60 03/23/2021  ? LDLCALC 106 (H) 03/23/2021  ? ALT 20 04/09/2021  ? AST 23 04/09/2021  ? NA 137 04/09/2021  ? K 4.6 04/09/2021  ? CL 104 04/09/2021  ? CREATININE 0.65 04/09/2021  ? BUN 33 (H) 04/09/2021  ? CO2 26 04/09/2021  ? TSH 4.85 (H) 03/23/2021  ? ? ?MM 3D SCREEN BREAST BILATERAL ? ?Result Date: 01/21/2022 ?CLINICAL DATA:  Screening. EXAM: DIGITAL SCREENING BILATERAL MAMMOGRAM WITH TOMOSYNTHESIS AND CAD TECHNIQUE: Bilateral screening digital craniocaudal and mediolateral oblique mammograms were obtained. Bilateral screening digital breast tomosynthesis was performed. The images were evaluated with computer-aided detection. COMPARISON:  Previous exam(s). ACR Breast Density Category c: The breast tissue is heterogeneously dense, which may  obscure small masses. FINDINGS: There are no findings suspicious for malignancy. IMPRESSION: No mammographic evidence of malignancy. A result letter of this screening mammogram will be mailed directly to the patient. RECOMMENDATION: Screening mammogram in one year. (Code:SM-B-01Y) BI-RADS CATEGORY  1: Negative. Electronically Signed   By: Fidela Salisbury M.D.   On: 01/21/2022 15:40 ? ? ?Assessment & Plan:  ? ?Problem List Items Addressed This Visit   ? ? Thrombocytopenia ([place]) - Primary  ?  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   Rechecking today ? ?Lab Results  ?Component Value Date  ? WBC 6.4 04/09/2021  ? HGB 14.0 04/09/2021  ? HCT 41.7 04/09/2021  ? MCV 95.4 04/09/2021  ? PLT 161 04/09/2021  ? ? ?  ?  ? Relevant Orders  ? CBC w/Diff  ? Thinning hair  ?  Screening labs ordered.  Consider telogen effluvium vs female pattern loss  ?  ?  ? Hypothyroidism  ?  She  has no overt signs of hyperthryoidism (pulse remains < 60 and she has gained 2 lbs) since increasing her levothyroxine dose to 88 mcg last march. rechecking level today ?  ?  ? Relevant Orders  ? TSH  ? Basal cell carcinoma (BCC) of lateral side wall of nose  ? Relevant Medications  ? ALPRAZolam (XANAX) 0.25 MG tablet  ? ?Other Visit Diagnoses   ? ? Moderate mixed hyperlipidemia not requiring statin therapy      ? Relevant Orders  ? Comp Met (CMET)  ? Lipid Profile  ? Loss of hair      ? Relevant Orders  ? DHEA-sulfate  ? Testos,Total,Free and SHBG (Female)  ? IBC + Ferritin  ? B12 and Folate Panel  ? Vitamin D deficiency      ? Relevant Orders  ? Vitamin D (25 hydroxy)  ? ?  ? ? ?I spent 30 minutes dedicated to the care of this patient on the date of this encounter to include pre-visit review of patient's medical history,  most recent  office visits with me and patient's specialists,  most recent ER visit/hospitalization, EKG, imaging studies, Face-to-face time with  the patient , and post visit ordering of testing and therapeutics.   ? ?Follow-up: Return in about 6 months (around 09/28/2022). ? ? ?[name], MD ?

## 2022-03-29 NOTE — Assessment & Plan Note (Addendum)
She has no overt signs of hyperthryoidism (pulse remains < 60 and she has gained 2 lbs) since increasing her levothyroxine dose to 88 mcg last march. rechecking level today ?

## 2022-03-29 NOTE — Patient Instructions (Signed)
I believe your hair loss is due to recent stressors,  but if there is an abnormality suggested by your labs I will recommend appropriate treatment  ? ? ?

## 2022-03-29 NOTE — Assessment & Plan Note (Signed)
Screening labs ordered.  Consider telogen effluvium vs female pattern loss  ?

## 2022-03-31 ENCOUNTER — Other Ambulatory Visit: Payer: Self-pay | Admitting: Internal Medicine

## 2022-03-31 DIAGNOSIS — E890 Postprocedural hypothyroidism: Secondary | ICD-10-CM

## 2022-03-31 MED ORDER — LEVOTHYROXINE SODIUM 100 MCG PO TABS: 100.0000 ug | ORAL_TABLET | Freq: Every day | ORAL | 1 refills | Status: DC

## 2022-03-31 NOTE — Assessment & Plan Note (Signed)
TSH is still at the ULN.  Increase levothyroxine to 100 mcg daily  ?

## 2022-04-02 LAB — DHEA-SULFATE: DHEA-SO4: 13 ug/dL (ref 4–157)

## 2022-04-02 LAB — TESTOS,TOTAL,FREE AND SHBG (FEMALE)
Free Testosterone: 5 pg/mL — ABNORMAL HIGH (ref 0.2–3.7)
Sex Hormone Binding: 80 nmol/L — ABNORMAL HIGH (ref 14–73)
Testosterone, Total, LC-MS-MS: 47 ng/dL — ABNORMAL HIGH (ref 2–45)

## 2022-04-08 ENCOUNTER — Encounter: Payer: Self-pay | Admitting: Internal Medicine

## 2022-04-08 MED ORDER — SPIRONOLACTONE 25 MG PO TABS: 25.0000 mg | ORAL_TABLET | Freq: Every day | ORAL | 3 refills | Status: DC

## 2022-04-08 NOTE — Addendum Note (Signed)
Addended by: [name] on: 04/08/2022 12:35 AM ? ? Modules accepted: Orders ? ?

## 2022-04-22 DIAGNOSIS — Z20822 Contact with and (suspected) exposure to covid-19: Secondary | ICD-10-CM | POA: Diagnosis not present

## 2022-05-02 DIAGNOSIS — F418 Other specified anxiety disorders: Secondary | ICD-10-CM | POA: Diagnosis not present

## 2022-05-09 DIAGNOSIS — Z20822 Contact with and (suspected) exposure to covid-19: Secondary | ICD-10-CM | POA: Diagnosis not present

## 2022-05-20 ENCOUNTER — Encounter: Payer: Self-pay | Admitting: Internal Medicine

## 2022-05-21 ENCOUNTER — Other Ambulatory Visit (INDEPENDENT_AMBULATORY_CARE_PROVIDER_SITE_OTHER): Payer: Medicare Other

## 2022-05-21 DIAGNOSIS — E890 Postprocedural hypothyroidism: Secondary | ICD-10-CM

## 2022-05-22 LAB — TSH: TSH: 2.51 u[IU]/mL (ref 0.35–5.50)

## 2022-06-07 ENCOUNTER — Other Ambulatory Visit: Payer: Self-pay | Admitting: Internal Medicine

## 2022-06-07 ENCOUNTER — Telehealth: Payer: Self-pay | Admitting: Internal Medicine

## 2022-06-07 MED ORDER — LEVOTHYROXINE SODIUM 100 MCG PO TABS: 100.0000 ug | ORAL_TABLET | Freq: Every day | ORAL | 1 refills | Status: DC

## 2022-06-07 NOTE — Telephone Encounter (Signed)
Called patient for clarity on the strength of Levothyroxine patient called stating the dosage that was sent in previous is not the correct dose she is currently taking. NO answer when I called for clarity

## 2022-06-07 NOTE — Telephone Encounter (Signed)
Pt called stating the wrong dose was called in to pharmacy for levothyroxine. Pt need a refill to [facility]

## 2022-06-25 DIAGNOSIS — Z85828 Personal history of other malignant neoplasm of skin: Secondary | ICD-10-CM | POA: Diagnosis not present

## 2022-07-12 ENCOUNTER — Encounter: Payer: Self-pay | Admitting: Internal Medicine

## 2022-07-17 NOTE — Telephone Encounter (Signed)
Pt is scheduled with Dr. Olivia Mackie on Friday.

## 2022-07-17 NOTE — Telephone Encounter (Signed)
LMTCB

## 2022-07-17 NOTE — Telephone Encounter (Signed)
Patient returned office phone call an appointment made.

## 2022-07-19 ENCOUNTER — Encounter: Payer: Self-pay | Admitting: Internal Medicine

## 2022-07-19 ENCOUNTER — Ambulatory Visit (INDEPENDENT_AMBULATORY_CARE_PROVIDER_SITE_OTHER): Payer: Medicare Other | Admitting: Internal Medicine

## 2022-07-19 VITALS — BP 116/60 | HR 54 | Temp 98.0°F | Ht 65.0 in | Wt 114.0 lb

## 2022-07-19 DIAGNOSIS — H1031 Unspecified acute conjunctivitis, right eye: Secondary | ICD-10-CM | POA: Diagnosis not present

## 2022-07-19 DIAGNOSIS — R001 Bradycardia, unspecified: Secondary | ICD-10-CM | POA: Diagnosis not present

## 2022-07-19 DIAGNOSIS — R42 Dizziness and giddiness: Secondary | ICD-10-CM

## 2022-07-19 DIAGNOSIS — H00011 Hordeolum externum right upper eyelid: Secondary | ICD-10-CM | POA: Diagnosis not present

## 2022-07-19 MED ORDER — ERYTHROMYCIN 5 MG/GM OP OINT: 1.0000 | TOPICAL_OINTMENT | Freq: Three times a day (TID) | OPHTHALMIC | 0 refills | Status: DC

## 2022-07-19 NOTE — Patient Instructions (Addendum)
Dr. Wallace Going    Phone Fax E-mail Address  [phone] Not available [address]    [place] [address]     Specialties     Ophthalmology             Stye A stye, also known as a hordeolum, is a bump that forms on an eyelid. It may look like a pimple next to the eyelash. A stye can form inside the eyelid (internal stye) or outside the eyelid (external stye). A stye can cause redness, swelling, and pain on the eyelid. Styes are very common. Anyone can get them at any age. They usually occur in just one eye at a time, but you may have more than one in either eye. What are the causes? A stye is caused by an infection. The infection is almost always caused by bacteria called Staphylococcus aureus. This is a common type of bacteria that lives on the skin. An internal stye may result from an infected oil-producing gland inside the eyelid. An external stye may be caused by an infection at the base of the eyelash (hair follicle). What increases the risk? You are more likely to develop a stye if: You have had a stye before. You have any of these conditions: Red, itchy, inflamed eyelids (blepharitis). A skin condition such as seborrheic dermatitis or rosacea. High fat levels in your blood (lipids). Dry eyes. What are the signs or symptoms? The most common symptom of a stye is eyelid pain. Internal styes are more painful than external styes. Other symptoms may include: Painful swelling of your eyelid. A scratchy feeling in your eye. Tearing and redness of your eye. A pimple-like bump on the edge of the eyelid. Pus draining from the stye. How is this diagnosed? Your health care provider may be able to diagnose a stye just by examining your eye. The health care provider may also check to make sure: You do not have a fever or other signs of a more serious infection. The infection has not spread to other parts of your eye or areas around your eye. How is this  treated? Most styes will clear up in a few days without treatment or with warm compresses applied to the area. You may need to use antibiotic drops or ointment to treat an infection. Sometimes, steroid drops or ointment are used in addition to antibiotics. In some cases, your health care provider may give you a small steroid injection in the eyelid. If your stye does not heal with routine treatment, your health care provider may drain pus from the stye using a thin blade or needle. This may be done if the stye is large, causing a lot of pain, or affecting your vision. Follow these instructions at home: Take over-the-counter and prescription medicines only as told by your health care provider. This includes eye drops or ointments. If you were prescribed an antibiotic medicine, steroid medicine, or both, apply or use them as told by your health care provider. Do not stop using the medicine even if your condition improves. Apply a warm, wet cloth (warm compress) to your eye for 5-10 minutes, 4 to 6 times a day. Clean the affected eyelid as directed by your health care provider. Do not wear contact lenses or eye makeup until your stye has healed and your health care provider says that it is safe. Do not try to pop or drain the stye. Do not rub your eye. Contact a health care provider if: You have chills  or a fever. Your stye does not go away after several days. Your stye affects your vision. Your eyeball becomes swollen, red, or painful. Get help right away if: You have pain when moving your eye around. Summary A stye is a bump that forms on an eyelid. It may look like a pimple next to the eyelash. A stye can form inside the eyelid (internal stye) or outside the eyelid (external stye). A stye can cause redness, swelling, and pain on the eyelid. Your health care provider may be able to diagnose a stye just by examining your eye. Apply a warm, wet cloth (warm compress) to your eye for 5-10 minutes,  4 to 6 times a day. This information is not intended to replace advice given to you by your health care provider. Make sure you discuss any questions you have with your health care provider. Document Revised: 02/21/2021 Document Reviewed: 02/21/2021 Elsevier Patient Education  [place].  Bacterial Conjunctivitis, Adult Bacterial conjunctivitis is an infection of the clear membrane that covers the white part of the eye and the inner surface of the eyelid (conjunctiva). When the blood vessels in the conjunctiva become inflamed, the eye becomes red or pink. The eye often feels irritated or itchy. Bacterial conjunctivitis spreads easily from person to person (is contagious). It also spreads easily from one eye to the other eye. What are the causes? This condition is caused by bacteria. You may get the infection if you come into close contact with: A person who is infected with the bacteria. Items that are contaminated with the bacteria, such as a face towel, contact lens solution, or eye makeup. What increases the risk? You are more likely to develop this condition if: You are exposed to other people who have the infection. You wear contact lenses. You have a sinus infection. You have had a recent eye injury or surgery. You have a weak body defense system (immune system). You have a medical condition that causes dry eyes. What are the signs or symptoms? Symptoms of this condition include: Thick, yellowish discharge from the eye. This may turn into a crust on the eyelid overnight and cause your eyelids to stick together. Tearing or watery eyes. Itchy eyes. Burning feeling in your eyes. Eye redness. Swollen eyelids. Blurred vision. How is this diagnosed? This condition is diagnosed based on your symptoms and medical history. Your health care provider may also take a sample of discharge from your eye to find the cause of your infection. How is this treated? This condition may be  treated with: Antibiotic eye drops or ointment to clear the infection more quickly and prevent the spread of infection to others. Antibiotic medicines taken by mouth (orally) to treat infections that do not respond to drops or ointments or that last longer than 10 days. Cool, wet cloths (cool compresses) placed on the eyes. Artificial tears applied 2-6 times a day. Follow these instructions at home: Medicines Take or apply your antibiotic medicine as told by your health care provider. Do not stop using the antibiotic, even if your condition improves, unless directed by your health care provider. Take or apply over-the-counter and prescription medicines only as told by your health care provider. Be very careful to avoid touching the edge of your eyelid with the eye-drop bottle or the ointment tube when you apply medicines to the affected eye. This will keep you from spreading the infection to your other eye or to other people. Managing discomfort Gently wipe away any drainage  from your eye with a warm, wet washcloth or a cotton ball. Apply a clean, cool compress to your eye for 10-20 minutes, 3-4 times a day. General instructions Do not wear contact lenses until the inflammation is gone and your health care provider says it is safe to wear them again. Ask your health care provider how to sterilize or replace your contact lenses before you use them again. Wear glasses until you can resume wearing contact lenses. Avoid wearing eye makeup until the inflammation is gone. Throw away any old eye cosmetics that may be contaminated. Change or wash your pillowcase every day. Do not share towels or washcloths. This may spread the infection. Wash your hands often with soap and water for at least 20 seconds and especially before touching your face or eyes. Use paper towels to dry your hands. Avoid touching or rubbing your eyes. Do not drive or use heavy machinery if your vision is blurred. Contact a health  care provider if: You have a fever. Your symptoms do not get better after 10 days. Get help right away if: You have a fever and your symptoms suddenly get worse. You have severe pain when you move your eye. You have facial pain, redness, or swelling. You have a sudden loss of vision. Summary Bacterial conjunctivitis is an infection of the clear membrane that covers the white part of the eye and the inner surface of the eyelid (conjunctiva). Bacterial conjunctivitis spreads easily from eye to eye and from person to person (is contagious). Wash your hands often with soap and water for at least 20 seconds and especially before touching your face or eyes. Use paper towels to dry your hands. Take or apply your antibiotic medicine as told by your health care provider. Do not stop using the antibiotic even if your condition improves. Contact a health care provider if you have a fever or if your symptoms do not get better after 10 days. Get help right away if you have a sudden loss of vision. This information is not intended to replace advice given to you by your health care provider. Make sure you discuss any questions you have with your health care provider. Document Revised: 03/28/2021 Document Reviewed: 03/28/2021 Elsevier Patient Education  [place].

## 2022-07-19 NOTE — Addendum Note (Signed)
Addended by: Orland Mustard on: 07/19/2022 12:52 PM   Modules accepted: Orders

## 2022-07-19 NOTE — Progress Notes (Addendum)
Chief Complaint  Patient presents with   Eye Problem    Pt states that her right eye sticks shut she has to pull her eye lid up to get it open in the am. She says this has been going on for a month and a half doesn't report any burning, itchiness, or blurred vision. She also states its not a crust but just feels sticky "stuck shut"   F/u  1. Right eyelid crusty x 1.5 months and sticking together with crusting she has been swimming appt Dr. Wallace Going 08/27/22 no pain, itching, watery eyes she had a stye right upper eyelid before eyelid crusting  2. Episodic lightheadness with bending over will do orthostatics today she is drinking water rec f/u with PCP she is no longer taking spironolactone  Orthostatics neg lying 120/74 hr 48, sitting 134/81 hr 50 standing 133/82 hr 56     Review of Systems  Constitutional:  Negative for weight loss.  HENT:  Negative for hearing loss.   Eyes:  Negative for blurred vision.  Respiratory:  Negative for shortness of breath.   Cardiovascular:  Negative for chest pain.  Gastrointestinal:  Negative for abdominal pain and blood in stool.  Genitourinary:  Negative for dysuria.  Musculoskeletal:  Negative for falls and joint pain.  Skin:  Negative for rash.  Neurological:  Positive for dizziness. Negative for headaches.  Psychiatric/Behavioral:  Negative for depression.    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  Polyps 9 mm in size.   COLONOSCOPY WITH PROPOFOL N/A 10/21/2018   Procedure: COLONOSCOPY WITH PROPOFOL;  Surgeon: Robert Bellow, MD;  Location: [facility] ENDOSCOPY;  Service: Endoscopy;  Laterality: N/A;   DILATION AND CURETTAGE OF UTERUS     JOINT REPLACEMENT  2005   done in [place]   kidney stones removal     MELANOMA EXCISION  06/29/2016   right hip replacement     SHOULDER ARTHROSCOPY WITH OPEN ROTATOR CUFF REPAIR Left 12/01/2018   Procedure: SHOULDER ARTHROSCOPY WITH OPEN ROTATOR CUFF REPAIR;  Surgeon: Corky Mull, MD;  Location: [facility] ORS;  Service: Orthopedics;  Laterality: Left;   Family History  Problem Relation Age of Onset   Diabetes Mother    Stroke Mother    Hyperlipidemia Mother    Hypertension Mother    Alcohol abuse Father    Diabetes Brother    Alcohol abuse Brother    Hypertension Brother    Alcohol abuse Brother    Heart disease Maternal Uncle    Breast cancer Neg Hx    Social History   Socioeconomic History   Marital status: Divorced    Spouse name: Not on file  Number of children: 2   Years of education: Not on file   Highest education level: Not on file  Occupational History   Occupation: Press photographer    Employer: [employer]  Tobacco Use   Smoking status: Former    Types: Cigarettes    Quit date: 09/24/2000    Years since quitting: 21.8   Smokeless tobacco: Never  Vaping Use   Vaping Use: Never used  Substance and Sexual Activity   Alcohol use: Yes    Comment: "weekends"   Drug use: No   Sexual activity: Yes    Birth control/protection: None  Other Topics Concern   Not on file  Social History Narrative   Patient lived most her her life in [place] and moved to [place] to help care for aging mother   Social Determinants of Health   Financial Resource Strain: Low Risk  (03/04/2022)   Overall Financial  Resource Strain (CARDIA)    Difficulty of Paying Living Expenses: Not hard at all  Food Insecurity: No Food Insecurity (03/04/2022)   Hunger Vital Sign    Worried About Running Out of Food in the Last Year: Never true    [place] in the Last Year: Never true  Transportation Needs: No Transportation Needs (03/04/2022)   PRAPARE - Hydrologist (Medical): No    Lack of Transportation (Non-Medical): No  Physical Activity: Sufficiently Active (03/04/2022)   Exercise Vital Sign    Days of Exercise per Week: 7 days    Minutes of Exercise per Session: 60 min  Stress: No Stress Concern Present (03/04/2022)   [place]    Feeling of Stress : Not at all  Social Connections: Unknown (03/04/2022)   Social Connection and Isolation Panel [NHANES]    Frequency of Communication with Friends and Family: More than three times a week    Frequency of Social Gatherings with Friends and Family: More than three times a week    Attends Religious Services: 1 to 4 times per year    Active Member of [employer] or Organizations: Yes    Attends Archivist Meetings: 1 to 4 times per year    Marital Status: Not on file  Intimate Partner Violence: Not At Risk (03/04/2022)   Humiliation, Afraid, Rape, and Kick questionnaire    Fear of Current or Ex-Partner: No    Emotionally Abused: No    Physically Abused: No    Sexually Abused: No   Current Meds  Medication Sig   ALPRAZolam (XANAX) 0.25 MG tablet Take 0.5 tablets (0.125 mg total) by mouth at bedtime as needed for sleep.   cholecalciferol (VITAMIN D3) 25 MCG (1000 UNIT) tablet Take 1,000 Units by mouth daily.   erythromycin ophthalmic ointment Place 1 Application into the right eye 3 (three) times daily. X 4-7 days   fexofenadine-pseudoephedrine (ALLEGRA-D) 60-120 MG 12 hr tablet Take 1 tablet by mouth daily as needed (allergies).   fish oil-omega-3 fatty acids 1000  MG capsule Take 1 g by mouth daily.    levothyroxine (SYNTHROID) 100 MCG tablet Take 1 tablet (100 mcg total) by mouth daily before breakfast.   nicotine polacrilex (NICORETTE) 2 MG gum Take 2 mg by mouth as needed for smoking cessation.   Allergies  Allergen Reactions   Penicillins     Childhood allergy, welts Has patient had a PCN reaction causing immediate rash, facial/tongue/throat swelling, SOB or lightheadedness with hypotension: No  Has patient had a PCN reaction causing severe rash involving mucus membranes or skin necrosis: Yes Has patient had a PCN reaction that required hospitalization: No Has patient had a PCN reaction occurring within the last 10 years: No If all of the above answers are "NO", then may proceed with Cephalosporin use.    Butenafine Rash    Morbilliform rash coverin entire body  June 2014   Terbinafine And Related Rash    Morbilliform rash coverin entire body  June 2014   Recent Results (from the past 2160 hour(s))  TSH     Status: None   Collection Time: 05/21/22  2:37 PM  Result Value Ref Range   TSH 2.51 0.35 - 5.50 uIU/mL   Objective  Body mass index is 18.97 kg/m. Wt Readings from Last 3 Encounters:  07/19/22 114 lb (51.7 kg)  03/29/22 117 lb (53.1 kg)  03/04/22 115 lb (52.2 kg)   Temp Readings from Last 3 Encounters:  07/19/22 98 F (36.7 C) (Oral)  03/29/22 98 F (36.7 C) (Oral)  04/09/21 97.8 F (36.6 C)   BP Readings from Last 3 Encounters:  07/19/22 116/60  03/29/22 124/80  04/09/21 (!) 141/83   Pulse Readings from Last 3 Encounters:  07/19/22 (!) 54  03/29/22 (!) 54  04/09/21 63    Physical Exam Vitals and nursing note reviewed.  Constitutional:      Appearance: Normal appearance. She is well-developed and well-groomed.  HENT:     Head: Normocephalic and atraumatic.  Eyes:     Conjunctiva/sclera: Conjunctivae normal.     Pupils: Pupils are equal, round, and reactive to light.  Cardiovascular:     Rate and Rhythm:  Regular rhythm. Bradycardia present.     Heart sounds: Normal heart sounds. No murmur heard. Pulmonary:     Effort: Pulmonary effort is normal.     Breath sounds: Normal breath sounds.  Abdominal:     General: Abdomen is flat. Bowel sounds are normal.     Tenderness: There is no abdominal tenderness.  Musculoskeletal:        General: No tenderness.  Skin:    General: Skin is warm and dry.  Neurological:     General: No focal deficit present.     Mental Status: She is alert and oriented to person, place, and time. Mental status is at baseline.     Cranial Nerves: Cranial nerves 2-12 are intact.     Gait: Gait is intact.  Psychiatric:        Attention and Perception: Attention and perception normal.        Mood and Affect: Mood and affect normal.        Speech: Speech normal.        Behavior: Behavior normal. Behavior is cooperative.        Thought Content: Thought content normal.        Cognition and Memory: Cognition and memory normal.        Judgment: Judgment normal.     Assessment  Plan  Acute conjunctivitis of right eye, unspecified acute conjunctivitis type - Plan: erythromycin ophthalmic ointment, Ambulatory referral to Ophthalmology Dr. Wallace Going   Hordeolum externum of right upper eyelid - Plan: Ambulatory referral to Ophthalmology  Episodic lightheadedness  Orthostatics today neg  Bradycardia Refer Dr. Rockey Situ Low head movements  Already hydrating    Provider: Dr. Olivia Mackie McLean-Scocuzza-Internal Medicine

## 2022-07-30 ENCOUNTER — Encounter: Payer: Self-pay | Admitting: Internal Medicine

## 2022-07-30 ENCOUNTER — Ambulatory Visit (INDEPENDENT_AMBULATORY_CARE_PROVIDER_SITE_OTHER): Payer: Medicare Other | Admitting: Internal Medicine

## 2022-07-30 DIAGNOSIS — R42 Dizziness and giddiness: Secondary | ICD-10-CM | POA: Diagnosis not present

## 2022-07-30 NOTE — Progress Notes (Signed)
Subjective:  Patient ID: Alexis Mcdonald, female    DOB: 12/06/1951  Age: 71 y.o. MRN: [id number]  CC: The encounter diagnosis was Episodic lightheadedness.   HPI Alexis Mcdonald presents for  Chief Complaint  Patient presents with   Follow-up    6 month follow up on hypothyroidism, and medication refills   1) hair loss:  improved with spironolactone trial.  She stopped it in May .    2) bradycardia:  chronic, very physically fit,  ex runner. Excellent exercise tolerance by 2015 stress test.  However,   3) has had several episodes of presyncope that have occurring with extreme exertion , notably hiking up a steep hill,  and once while  bending over.   Denies vertigo, chest pain, dyspnea, but concerned given FH of CVA.  Craves salt.  Has appt with Dr. Rockey Situ at the end of September.  Afraid to exercise vigorously until then.   Outpatient Medications Prior to Visit  Medication Sig Dispense Refill   acetaminophen (TYLENOL 8 HOUR ARTHRITIS PAIN) 650 MG CR tablet Take 2 tablets (1,300 mg total) by mouth every 8 (eight) hours as needed for pain. 30 tablet 0   ALPRAZolam (XANAX) 0.25 MG tablet Take 0.5 tablets (0.125 mg total) by mouth at bedtime as needed for sleep. 30 tablet 5   cholecalciferol (VITAMIN D3) 25 MCG (1000 UNIT) tablet Take 1,000 Units by mouth daily.     fexofenadine-pseudoephedrine (ALLEGRA-D) 60-120 MG 12 hr tablet Take 1 tablet by mouth daily as needed (allergies).     fish oil-omega-3 fatty acids 1000 MG capsule Take 1 g by mouth daily.      levothyroxine (SYNTHROID) 100 MCG tablet Take 1 tablet (100 mcg total) by mouth daily before breakfast. 90 tablet 1   nicotine polacrilex (NICORETTE) 2 MG gum Take 2 mg by mouth as needed for smoking cessation.     psyllium (REGULOID) 0.52 g capsule Take 0.52 g by mouth daily.     erythromycin ophthalmic ointment Place 1 Application into the right eye 3 (three) times daily. X 4-7 days (Patient not taking:  Reported on 07/30/2022) 3.5 g 0   No facility-administered medications prior to visit.    Review of Systems;  Patient denies headache, fevers, malaise, unintentional weight loss, skin rash, eye pain, sinus congestion and sinus pain, sore throat, dysphagia,  hemoptysis , cough, dyspnea, wheezing, chest pain, palpitations, orthopnea, edema, abdominal pain, nausea, melena, diarrhea, constipation, flank pain, dysuria, hematuria, urinary  Frequency, nocturia, numbness, tingling, seizures,  Focal weakness, Loss of consciousness,  Tremor, insomnia, depression, anxiety, and suicidal ideation.      Objective:  BP 126/62 (BP Location: Left Arm, Patient Position: Sitting, Cuff Size: Normal)   Pulse (!) 50   Temp 98.1 F (36.7 C) (Oral)   Ht '5\' 5"'$  (1.651 m)   Wt 113 lb 12.8 oz (51.6 kg)   SpO2 99%   BMI 18.94 kg/m   BP Readings from Last 3 Encounters:  07/30/22 126/62  07/19/22 116/60  03/29/22 124/80    Wt Readings from Last 3 Encounters:  07/30/22 113 lb 12.8 oz (51.6 kg)  07/19/22 114 lb (51.7 kg)  03/29/22 117 lb (53.1 kg)    General appearance: alert, cooperative and appears stated age Ears: normal TM's and external ear canals both ears Throat: lips, mucosa, and tongue normal; teeth and gums normal Neck: no adenopathy, no carotid bruit, supple, symmetrical, trachea midline and thyroid not enlarged, symmetric, no tenderness/mass/nodules Back: symmetric, no  curvature. ROM normal. No CVA tenderness. Lungs: clear to auscultation bilaterally Heart: regular rate and rhythm, S1, S2 normal, no murmur, click, rub or gallop Abdomen: soft, non-tender; bowel sounds normal; no masses,  no organomegaly Pulses: 2+ and symmetric Skin: Skin color, texture, turgor normal. No rashes or lesions Lymph nodes: Cervical, supraclavicular, and axillary nodes normal.  No results found for: "HGBA1C"  Lab Results  Component Value Date   CREATININE 0.67 03/29/2022   CREATININE 0.65 04/09/2021    CREATININE 0.71 03/23/2021    Lab Results  Component Value Date   WBC 3.0 (L) 03/29/2022   HGB 13.4 03/29/2022   HCT 39.5 03/29/2022   PLT 162.0 03/29/2022   GLUCOSE 93 03/29/2022   CHOL 192 03/29/2022   TRIG 53.0 03/29/2022   HDL 92.20 03/29/2022   LDLCALC 89 03/29/2022   ALT 12 03/29/2022   AST 18 03/29/2022   NA 138 03/29/2022   K 4.4 03/29/2022   CL 104 03/29/2022   CREATININE 0.67 03/29/2022   BUN 22 03/29/2022   CO2 28 03/29/2022   TSH 2.51 05/21/2022    MM 3D SCREEN BREAST BILATERAL  Result Date: 01/21/2022 CLINICAL DATA:  Screening. EXAM: DIGITAL SCREENING BILATERAL MAMMOGRAM WITH TOMOSYNTHESIS AND CAD TECHNIQUE: Bilateral screening digital craniocaudal and mediolateral oblique mammograms were obtained. Bilateral screening digital breast tomosynthesis was performed. The images were evaluated with computer-aided detection. COMPARISON:  Previous exam(s). ACR Breast Density Category c: The breast tissue is heterogeneously dense, which may obscure small masses. FINDINGS: There are no findings suspicious for malignancy. IMPRESSION: No mammographic evidence of malignancy. A result letter of this screening mammogram will be mailed directly to the patient. RECOMMENDATION: Screening mammogram in one year. (Code:SM-B-01Y) BI-RADS CATEGORY  1: Negative. Electronically Signed   By: Fidela Salisbury M.D.   On: 01/21/2022 15:40   Assessment & Plan:   Problem List Items Addressed This Visit     Episodic lightheadedness    Occurring with uphill climbing and with bending over.  Not orthostatic by recent in office testing.  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  Will see if cardiology can place holter monitor or schedule exercise stress test prior to late September         I spent a total of  30 minutes with this patient in a face to face visit on the date of this encounter reviewing the  last office visit with me ,  last one with Dr Aundra Dubin , most recent with patient's cardiologist in   2015 ,  patient's diet and eating habits,  and post visit ordering of testing and therapeutics.    Follow-up: No follow-ups on file.   [name], MD

## 2022-07-30 NOTE — Assessment & Plan Note (Signed)
Occurring with uphill climbing and with bending over.  Not orthostatic by recent in office testing.  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  Will see if cardiology can place holter monitor or schedule exercise stress test prior to late September

## 2022-08-27 DIAGNOSIS — H2513 Age-related nuclear cataract, bilateral: Secondary | ICD-10-CM | POA: Diagnosis not present

## 2022-08-29 DIAGNOSIS — F418 Other specified anxiety disorders: Secondary | ICD-10-CM | POA: Diagnosis not present

## 2022-09-16 DIAGNOSIS — Z23 Encounter for immunization: Secondary | ICD-10-CM | POA: Diagnosis not present

## 2022-09-18 ENCOUNTER — Ambulatory Visit: Payer: Medicare Other | Attending: Cardiovascular Disease | Admitting: Cardiovascular Disease

## 2022-09-18 ENCOUNTER — Encounter: Payer: Self-pay | Admitting: Cardiovascular Disease

## 2022-09-18 VITALS — BP 120/80 | HR 53 | Ht 64.5 in | Wt 116.0 lb

## 2022-09-18 DIAGNOSIS — I209 Angina pectoris, unspecified: Secondary | ICD-10-CM | POA: Insufficient documentation

## 2022-09-18 DIAGNOSIS — Z01812 Encounter for preprocedural laboratory examination: Secondary | ICD-10-CM | POA: Insufficient documentation

## 2022-09-18 DIAGNOSIS — R42 Dizziness and giddiness: Secondary | ICD-10-CM | POA: Insufficient documentation

## 2022-09-18 DIAGNOSIS — R001 Bradycardia, unspecified: Secondary | ICD-10-CM | POA: Insufficient documentation

## 2022-09-18 DIAGNOSIS — R072 Precordial pain: Secondary | ICD-10-CM | POA: Insufficient documentation

## 2022-09-18 NOTE — Patient Instructions (Addendum)
Medication Instructions:  No changes  If you need a refill on your cardiac medications before your next appointment, please call your pharmacy.    Lab work: - Your physician recommends that you have lab work:   [place] Entrance at [facility] 1st desk on the right to check in (REGISTRATION)  Lab hours: Monday- Friday (7:30 am- 5:30 pm)     Testing/Procedures:  1) Cardiac CT angiogram:(angina, neck pain)  Thursday 09/26/22 at 1:15 pm  - Your physician has requested that you have cardiac CT. Cardiac computed tomography (CT) is a painless test that uses an x-ray machine to take clear, detailed pictures of your heart.     Your cardiac CT will be scheduled at:   [facility] [address] [place], [place] [address] [phone]   If scheduled at [facility] or [facility], please arrive 15 mins early for check-in and test prep.   Please follow these instructions carefully (unless otherwise directed):   We will administer nitroglycerin during this exam.   On the Night Before the Test: Be sure to Drink plenty of water. Do not consume any caffeinated/decaffeinated beverages or chocolate 12 hours prior to your test. Do not take any antihistamines 12 hours prior to your test.  On the Day of the Test: Drink plenty of water until 1 hour prior to the test. You may take your regular medications prior to the test.  FEMALES- please wear underwire-free bra if available, avoid dresses & tight clothing       After the Test: Drink plenty of water. After receiving IV contrast, you may experience a mild flushed feeling. This is normal. On occasion, you may experience a mild rash up to 24 hours after the test. This is not dangerous. If this occurs, you can take Benadryl 25 mg and increase your fluid intake. If you experience trouble breathing, this can be serious. If it is severe call 911  IMMEDIATELY. If it is mild, please call our office.  Some insurance companies will need an authorization prior to the service being performed.   For non-scheduling related questions, please contact the cardiac imaging nurse navigator should you have any questions/concerns: Marchia Bond, Cardiac Imaging Nurse Navigator Gordy Clement, Cardiac Imaging Nurse Navigator  Heart and Vascular Services Direct Office Dial: [phone]   For scheduling needs, including cancellations and rescheduling, please call [place], [phone].     Follow-Up: At [facility], you and your health needs are our priority.  As part of our continuing mission to provide you with exceptional heart care, we have created designated Provider Care Teams.  These Care Teams include your primary Cardiologist (physician) and Advanced Practice Providers (APPs -  Physician Assistants and Nurse Practitioners) who all work together to provide you with the care you need, when you need it.  You will need a follow up appointment as needed  Providers on your designated Care Team:   Murray Hodgkins, NP Christell Faith, PA-C Cadence Kathlen Mody, [place]  COVID-19 Vaccine Information can be found at: [email or web address] For questions related to vaccine distribution or appointments, please email [email or web address] or call [email or web address].

## 2022-09-18 NOTE — Progress Notes (Signed)
Cardiology Office Note  Date:  09/18/2022   ID:  Alexis Mcdonald, DOB 03-17-1951, MRN [id number]  PCP:  [name], MD   Chief Complaint  Patient presents with   New Patient (Initial Visit)    Referred by Dr. Orland Mustard for episodic lightheadedness, bradycardia. Medications reviewed by the patient verbally.     HPI:  Alexis Mcdonald is a 71 year old woman with past medical history of Asymptomatic bradycardia Remote smoking, quit in her 30 to 66s Referred by Dr. Orland Mustard for episodic lightheadedness, bradycardia  In follow-up today she reports that she has been having symptoms of throat tightness on heavy exertion such as hiking, treadmill, climbing a hill At the time she was chewing lots of nicorette, 4 mg "left and right" Also symptoms of lightheadedness, worse with head down, better with head up  She cut back on nicotine gum,  Symptoms better , has not had it at all, 4-5 weeks no symptoms Treadmill since then with no sx  Water fitness instructor, active at baseline Chronic bradycardia, in general reports she is asymptomatic Treadmill June 2015 had good chronotropic competence heart rate up to 134 bpm  EKG personally reviewed by myself on todays visit Sinus bradycardia rate 53 bpm poor R wave progression no significant ST-T wave changes  PMH:   has a past medical history of Borderline osteopenia, Cancer ([place]), Colon polyp (2009), COVID-19 (06/05/2021), 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  PSH:    Past Surgical History:  Procedure Laterality Date   COLONOSCOPY  03/23/2010   Diverticulosis   COLONOSCOPY W/ BIOPSIES  03/14/2006   Tubular adenoma of the rectum x2, upper lesion with focal high-grade dysplasia.  Polyps 9 mm in size.   COLONOSCOPY WITH PROPOFOL N/A 10/21/2018   Procedure: COLONOSCOPY WITH PROPOFOL;   Surgeon: Robert Bellow, MD;  Location: [facility] ENDOSCOPY;  Service: Endoscopy;  Laterality: N/A;   DILATION AND CURETTAGE OF UTERUS     JOINT REPLACEMENT  2005   done in [place]   kidney stones removal     MELANOMA EXCISION  06/29/2016   right hip replacement     SHOULDER ARTHROSCOPY WITH OPEN ROTATOR CUFF REPAIR Left 12/01/2018   Procedure: SHOULDER ARTHROSCOPY WITH OPEN ROTATOR CUFF REPAIR;  Surgeon: Corky Mull, MD;  Location: [facility] ORS;  Service: Orthopedics;  Laterality: Left;    Current Outpatient Medications  Medication Sig Dispense Refill   acetaminophen (TYLENOL 8 HOUR ARTHRITIS PAIN) 650 MG CR tablet Take 2 tablets (1,300 mg total) by mouth every 8 (eight) hours as needed for pain. 30 tablet 0   ALPRAZolam (XANAX) 0.25 MG tablet Take 0.5 tablets (0.125 mg total) by mouth at bedtime as needed for sleep. 30 tablet 5   cholecalciferol (VITAMIN D3) 25 MCG (1000 UNIT) tablet Take 1,000 Units by mouth daily.     fexofenadine-pseudoephedrine (ALLEGRA-D) 60-120 MG 12 hr tablet Take 1 tablet by mouth daily as needed (allergies).     fish oil-omega-3 fatty acids 1000 MG capsule Take 1 g by mouth daily.      levothyroxine (SYNTHROID) 100 MCG tablet Take 1 tablet (100 mcg total) by mouth daily before breakfast. 90 tablet 1   nicotine polacrilex (NICORETTE) 2 MG gum Take 2 mg by mouth as needed for smoking cessation.     psyllium (REGULOID) 0.52 g capsule Take 0.52 g by mouth daily.     No current facility-administered  medications for this visit.     Allergies:   Penicillins, Butenafine, and Terbinafine and related   Social History:  The patient  reports that she quit smoking about 21 years ago. Her smoking use included cigarettes. She has never used smokeless tobacco. She reports current alcohol use. She reports that she does not use drugs.   Family History:   family history includes Alcohol abuse in her brother, brother, and father; Diabetes in her brother and mother; Heart disease in  her maternal uncle; Hyperlipidemia in her mother; Hypertension in her brother and mother; Stroke in her mother.    Review of Systems: Review of Systems  Constitutional: Negative.   HENT: Negative.    Respiratory: Negative.    Cardiovascular:  Positive for chest pain.  Gastrointestinal: Negative.   Musculoskeletal:  Positive for neck pain.  Neurological: Negative.   Psychiatric/Behavioral: Negative.    All other systems reviewed and are negative.   PHYSICAL EXAM: VS:  BP 120/80 (BP Location: Right Arm, Patient Position: Sitting, Cuff Size: Normal)   Pulse (!) 53   Ht 5' 4.5" (1.638 m)   Wt 116 lb (52.6 kg)   SpO2 99%   BMI 19.60 kg/m  , BMI Body mass index is 19.6 kg/m. GEN: Well nourished, well developed, in no acute distress HEENT: normal Neck: no JVD, carotid bruits, or masses Cardiac: RRR; no murmurs, rubs, or gallops,no edema  Respiratory:  clear to auscultation bilaterally, normal work of breathing GI: soft, nontender, nondistended, + BS MS: no deformity or atrophy Skin: warm and dry, no rash Neuro:  Strength and sensation are intact Psych: euthymic mood, full affect   Recent Labs: 03/29/2022: ALT 12; BUN 22; Creatinine, Ser 0.67; Hemoglobin 13.4; Platelets 162.0; Potassium 4.4; Sodium 138 05/21/2022: TSH 2.51    Lipid Panel Lab Results  Component Value Date   CHOL 192 03/29/2022   HDL 92.20 03/29/2022   LDLCALC 89 03/29/2022   TRIG 53.0 03/29/2022      Wt Readings from Last 3 Encounters:  09/18/22 116 lb (52.6 kg)  07/30/22 113 lb 12.8 oz (51.6 kg)  07/19/22 114 lb (51.7 kg)      ASSESSMENT AND PLAN:  Problem List Items Addressed This Visit     Episodic lightheadedness - Primary   Relevant Orders   EKG 12-Lead   Bradycardia   Relevant Orders   EKG 12-Lead   Other Visit Diagnoses     Precordial pain       Relevant Orders   CT CORONARY MORPH W/CTA COR W/SCORE W/CA W/CM &/OR WO/CM   Basic metabolic panel   Pre-procedure lab exam        Relevant Orders   Basic metabolic panel      Angina Reports having episodes of tightness especially in the neck on heavy exertion such as treadmill, hiking, hills Concerned it could be from overuse of Nicorette gum and symptoms have improved since cutting back on her gum Risk factors for coronary disease including 20+ year smoking history, hyperlipidemia After discussion we have ordered cardiac CTA to rule out underlying ischemia  Hyperlipidemia Currently not on medications, total cholesterol ranging from 190 up to 210 Cardiac CTA as above to help guide management  Former smoker She reports that she was overdoing Nicorette gum chewing 4 mg doses frequently.  Feels better since cutting back on the Nicorette gum  Bradycardia Asymptomatic, good blood pressure, good chronotropic competence Active at baseline, fitness instructor No indication for pacemaker   Total encounter time more  than 50 minutes  Greater than 50% was spent in counseling and coordination of care with the patient    Signed, Esmond Plants, M.D., Ph.D. [place], [place]

## 2022-09-23 ENCOUNTER — Other Ambulatory Visit
Admission: RE | Admit: 2022-09-23 | Discharge: 2022-09-23 | Disposition: A | Discharge status: Home / Self Care | Payer: Medicare Other | Source: Ambulatory Visit | Attending: Cardiovascular Disease | Admitting: Cardiovascular Disease

## 2022-09-23 DIAGNOSIS — Z01812 Encounter for preprocedural laboratory examination: Secondary | ICD-10-CM | POA: Diagnosis not present

## 2022-09-23 DIAGNOSIS — R072 Precordial pain: Secondary | ICD-10-CM | POA: Diagnosis not present

## 2022-09-23 LAB — BASIC METABOLIC PANEL
Anion gap: 5 (ref 5–15)
BUN: 21 mg/dL (ref 8–23)
CO2: 29 mmol/L (ref 22–32)
Calcium: 9.2 mg/dL (ref 8.9–10.3)
Chloride: 107 mmol/L (ref 98–111)
Creatinine, Ser: 0.75 mg/dL (ref 0.44–1.00)
GFR, Estimated: >60 mL/min (ref >60)
Glucose, Bld: 101 mg/dL — ABNORMAL HIGH (ref 70–99)
Potassium: 4.2 mmol/L (ref 3.5–5.1)
Sodium: 141 mmol/L (ref 135–145)

## 2022-09-25 ENCOUNTER — Telehealth (HOSPITAL_COMMUNITY): Payer: Self-pay | Admitting: *Deleted

## 2022-09-25 NOTE — Telephone Encounter (Signed)
Patient returning call regarding upcoming cardiac imaging study; pt verbalizes understanding of appt date/time, parking situation and where to check in, and verified current allergies; name and call back number provided for further questions should they arise  Jaquasha Carnevale RN Navigator Cardiac Imaging [facility] [phone] office [phone] cell  

## 2022-09-25 NOTE — Telephone Encounter (Signed)
Attempted to call patient regarding upcoming cardiac CT appointment. °Left message on voicemail with name and callback number ° °Aviraj Kentner RN Navigator Cardiac Imaging °[facility] °[phone] Office °[phone] Cell ° °

## 2022-09-26 ENCOUNTER — Ambulatory Visit
Admission: RE | Admit: 2022-09-26 | Discharge: 2022-09-26 | Disposition: A | Discharge status: Home / Self Care | Payer: Medicare Other | Source: Ambulatory Visit | Attending: Cardiovascular Disease | Admitting: Cardiovascular Disease

## 2022-09-26 DIAGNOSIS — R072 Precordial pain: Secondary | ICD-10-CM | POA: Insufficient documentation

## 2022-09-26 DIAGNOSIS — I209 Angina pectoris, unspecified: Secondary | ICD-10-CM | POA: Diagnosis not present

## 2022-09-26 DIAGNOSIS — Z9189 Other specified personal risk factors, not elsewhere classified: Secondary | ICD-10-CM | POA: Diagnosis not present

## 2022-09-26 DIAGNOSIS — R911 Solitary pulmonary nodule: Secondary | ICD-10-CM | POA: Diagnosis not present

## 2022-09-26 MED ORDER — IOHEXOL 350 MG/ML SOLN
75.0000 mL | Freq: Once | INTRAVENOUS | Status: AC | PRN
  Administered 2022-09-26: 75 mL via INTRAVENOUS

## 2022-09-26 MED ORDER — NITROGLYCERIN 0.4 MG SL SUBL
0.8000 mg | SUBLINGUAL_TABLET | Freq: Once | SUBLINGUAL | Status: AC
  Administered 2022-09-26: 0.8 mg via SUBLINGUAL

## 2022-09-26 NOTE — Progress Notes (Signed)
Patient tolerated CT well. Drank water after. Vital signs stable encourage to drink water throughout day.Reasons explained and verbalized understanding. Ambulated steady gait.  

## 2022-09-27 ENCOUNTER — Telehealth: Payer: Self-pay | Admitting: Cardiovascular Disease

## 2022-09-27 NOTE — Telephone Encounter (Signed)
Patient made aware of Cardiac CTA results and Dr. Donivan Scull recommendation. Adv the patient that the results will be fwd to her pcp Dr. Derrel Nip to f/u on the recommended 1 yr imaging. Patient verbalized understanding.

## 2022-09-27 NOTE — Telephone Encounter (Signed)
-----   Message from Minna Merritts, MD sent at 09/27/2022  9:32 AM EDT ----- Cardiac CTA No significant coronary disease, calcium score of 0 No further cardiac testing needed Incidental finding of 3 mm nodule left lower lobe felt to be benign though given prior smoking history, could consider repeat chest CT scan in 12 months to follow size of nodule

## 2022-09-27 NOTE — Telephone Encounter (Signed)
Nodule found left lower lobe on CT and recommending CT non-contrast and requesting call back to verify the information.

## 2022-09-27 NOTE — Telephone Encounter (Signed)
Returned the call to [place] at [facility]. Adv them that the Cardiac CT  results including the Radiologist over read is viewable in the pt chart.

## 2022-09-29 DIAGNOSIS — Z9189 Other specified personal risk factors, not elsewhere classified: Secondary | ICD-10-CM | POA: Insufficient documentation

## 2022-09-30 ENCOUNTER — Ambulatory Visit: Payer: Medicare Other | Admitting: Internal Medicine

## 2022-10-10 DIAGNOSIS — F418 Other specified anxiety disorders: Secondary | ICD-10-CM | POA: Diagnosis not present

## 2022-10-18 DIAGNOSIS — Z23 Encounter for immunization: Secondary | ICD-10-CM | POA: Diagnosis not present

## 2022-12-01 ENCOUNTER — Other Ambulatory Visit: Payer: Self-pay | Admitting: Internal Medicine

## 2022-12-05 DIAGNOSIS — D2272 Melanocytic nevi of left lower limb, including hip: Secondary | ICD-10-CM | POA: Diagnosis not present

## 2022-12-05 DIAGNOSIS — D2261 Melanocytic nevi of right upper limb, including shoulder: Secondary | ICD-10-CM | POA: Diagnosis not present

## 2022-12-05 DIAGNOSIS — L570 Actinic keratosis: Secondary | ICD-10-CM | POA: Diagnosis not present

## 2022-12-05 DIAGNOSIS — L821 Other seborrheic keratosis: Secondary | ICD-10-CM | POA: Diagnosis not present

## 2022-12-05 DIAGNOSIS — F418 Other specified anxiety disorders: Secondary | ICD-10-CM | POA: Diagnosis not present

## 2022-12-05 DIAGNOSIS — X32XXXA Exposure to sunlight, initial encounter: Secondary | ICD-10-CM | POA: Diagnosis not present

## 2022-12-05 DIAGNOSIS — D225 Melanocytic nevi of trunk: Secondary | ICD-10-CM | POA: Diagnosis not present

## 2022-12-05 DIAGNOSIS — L814 Other melanin hyperpigmentation: Secondary | ICD-10-CM | POA: Diagnosis not present

## 2022-12-05 DIAGNOSIS — D2262 Melanocytic nevi of left upper limb, including shoulder: Secondary | ICD-10-CM | POA: Diagnosis not present

## 2022-12-05 DIAGNOSIS — D2271 Melanocytic nevi of right lower limb, including hip: Secondary | ICD-10-CM | POA: Diagnosis not present

## 2022-12-10 NOTE — Telephone Encounter (Signed)
Error

## 2022-12-10 NOTE — Telephone Encounter (Signed)
MyChart messgae sent to patient. 

## 2022-12-26 DIAGNOSIS — F418 Other specified anxiety disorders: Secondary | ICD-10-CM | POA: Diagnosis not present

## 2022-12-27 ENCOUNTER — Other Ambulatory Visit: Payer: Self-pay | Admitting: Internal Medicine

## 2022-12-27 DIAGNOSIS — Z1231 Encounter for screening mammogram for malignant neoplasm of breast: Secondary | ICD-10-CM

## 2022-12-30 HISTORY — PX: OTHER SURGICAL HISTORY: SHX169

## 2023-01-16 DIAGNOSIS — F413 Other mixed anxiety disorders: Secondary | ICD-10-CM | POA: Diagnosis not present

## 2023-01-22 ENCOUNTER — Ambulatory Visit
Admission: RE | Admit: 2023-01-22 | Discharge: 2023-01-22 | Disposition: A | Discharge status: Home / Self Care | Payer: Medicare Other | Source: Ambulatory Visit | Attending: Internal Medicine | Admitting: Internal Medicine

## 2023-01-22 DIAGNOSIS — Z1231 Encounter for screening mammogram for malignant neoplasm of breast: Secondary | ICD-10-CM | POA: Insufficient documentation

## 2023-02-13 ENCOUNTER — Telehealth: Payer: Self-pay | Admitting: Internal Medicine

## 2023-02-13 DIAGNOSIS — F413 Other mixed anxiety disorders: Secondary | ICD-10-CM | POA: Diagnosis not present

## 2023-02-13 NOTE — Telephone Encounter (Signed)
Spoke with pt to let her know that she will need to schedule an appt to discuss restarting the Effexor. Pt has been scheduled for Monday 02/17/2023.

## 2023-02-13 NOTE — Telephone Encounter (Signed)
Prescription Request  02/13/2023  Is this a "Controlled Substance" medicine? No  LOV: 07/30/2022  What is the name of the medication or equipment? Patient would like to restart her effecor XL  Have you contacted your pharmacy to request a refill? No   Which pharmacy would you like this sent to?  [facility] [id number], [place] [address] Phone: [phone] Fax: [phone]    Patient notified that their request is being sent to the clinical staff for review and that they should receive a response within 2 business days.   Please advise at Mobile [phone] (mobile)

## 2023-02-17 ENCOUNTER — Encounter: Payer: Self-pay | Admitting: Internal Medicine

## 2023-02-17 ENCOUNTER — Ambulatory Visit (INDEPENDENT_AMBULATORY_CARE_PROVIDER_SITE_OTHER): Payer: Medicare Other | Admitting: Internal Medicine

## 2023-02-17 VITALS — BP 130/62 | HR 56 | Temp 98.4°F | Ht 64.5 in | Wt 120.4 lb

## 2023-02-17 DIAGNOSIS — D696 Thrombocytopenia, unspecified: Secondary | ICD-10-CM | POA: Diagnosis not present

## 2023-02-17 DIAGNOSIS — Z9189 Other specified personal risk factors, not elsewhere classified: Secondary | ICD-10-CM | POA: Diagnosis not present

## 2023-02-17 DIAGNOSIS — F5102 Adjustment insomnia: Secondary | ICD-10-CM | POA: Diagnosis not present

## 2023-02-17 DIAGNOSIS — F331 Major depressive disorder, recurrent, moderate: Secondary | ICD-10-CM | POA: Diagnosis not present

## 2023-02-17 DIAGNOSIS — D709 Neutropenia, unspecified: Secondary | ICD-10-CM

## 2023-02-17 DIAGNOSIS — R42 Dizziness and giddiness: Secondary | ICD-10-CM

## 2023-02-17 DIAGNOSIS — R911 Solitary pulmonary nodule: Secondary | ICD-10-CM | POA: Diagnosis not present

## 2023-02-17 DIAGNOSIS — D126 Benign neoplasm of colon, unspecified: Secondary | ICD-10-CM | POA: Diagnosis not present

## 2023-02-17 DIAGNOSIS — E782 Mixed hyperlipidemia: Secondary | ICD-10-CM

## 2023-02-17 DIAGNOSIS — E890 Postprocedural hypothyroidism: Secondary | ICD-10-CM | POA: Diagnosis not present

## 2023-02-17 MED ORDER — VENLAFAXINE HCL ER 37.5 MG PO CP24: 37.5000 mg | ORAL_CAPSULE | Freq: Every day | ORAL | 1 refills | Status: DC

## 2023-02-17 NOTE — Assessment & Plan Note (Signed)
Recommend trial of relaxium , as she wants to avoid hypnotics

## 2023-02-17 NOTE — Assessment & Plan Note (Signed)
TSH was increased to 100 mcg daily at last visit. Repeat level is due   Lab Results  Component Value Date   TSH 2.51 05/21/2022

## 2023-02-17 NOTE — Assessment & Plan Note (Signed)
Normal cardiac workup  calcium score zero

## 2023-02-17 NOTE — Assessment & Plan Note (Signed)
An 8 mm TA was removed in 2019 ([facility]).  Referring to Dr Marius Ditch for 5 yr follow up

## 2023-02-17 NOTE — Progress Notes (Signed)
Subjective:  Patient ID: Alexis Mcdonald, female    DOB: Aug 20, 1951  Age: 72 y.o. MRN: [id number]  CC: The primary encounter diagnosis was Episodic lightheadedness. Diagnoses of Thrombocytopenia ([place]), Neutropenia, unspecified type ([place]), Postoperative hypothyroidism, Moderate mixed hyperlipidemia not requiring statin therapy, Tubular adenoma of colon, Pulmonary nodule less than 1 cm in diameter with low risk for malignant neoplasm, Moderate episode of recurrent major depressive disorder ([place]), and Insomnia due to stress were also pertinent to this visit.   HPI Alexis Mcdonald presents for  Chief Complaint  Patient presents with   Medical Management of Chronic Issues    Discuss restarting Effexor    Remote history of depression managed with Effexor.  Last seen in August 2023.  ,  did not resume medication.  Has been seeing a therapist for while who recommended pharmacotherapy .  For a while she has felt No joy,  looks forward to going to bed.  Then lies awake worrying about about things she can't change. Swims for pleasure and for  therapy but has had  no outlet since the city pool went under reconstruction . Trying to stay busy. Exercises  daily for several hours.  Not drinking more than 1 glass of wine per night, weekends only.     Outpatient Medications Prior to Visit  Medication Sig Dispense Refill   acetaminophen (TYLENOL 8 HOUR ARTHRITIS PAIN) 650 MG CR tablet Take 2 tablets (1,300 mg total) by mouth every 8 (eight) hours as needed for pain. 30 tablet 0   ALPRAZolam (XANAX) 0.25 MG tablet Take 0.5 tablets (0.125 mg total) by mouth at bedtime as needed for sleep. 30 tablet 5   cholecalciferol (VITAMIN D3) 25 MCG (1000 UNIT) tablet Take 1,000 Units by mouth daily.     fexofenadine-pseudoephedrine (ALLEGRA-D) 60-120 MG 12 hr tablet Take 1 tablet by mouth daily as needed (allergies).     fish oil-omega-3 fatty acids 1000 MG capsule Take 1 g by mouth daily.       levothyroxine (SYNTHROID) 100 MCG tablet TAKE 1 TABLET BY MOUTH DAILY BEFORE BREAKFAST 90 tablet 1   nicotine polacrilex (NICORETTE) 2 MG gum Take 2 mg by mouth as needed for smoking cessation.     psyllium (REGULOID) 0.52 g capsule Take 0.52 g by mouth daily.     No facility-administered medications prior to visit.    Review of Systems;  Patient denies headache, fevers, malaise, unintentional weight loss, skin rash, eye pain, sinus congestion and sinus pain, sore throat, dysphagia,  hemoptysis , cough, dyspnea, wheezing, chest pain, palpitations, orthopnea, edema, abdominal pain, nausea, melena, diarrhea, constipation, flank pain, dysuria, hematuria, urinary  Frequency, nocturia, numbness, tingling, seizures,  Focal weakness, Loss of consciousness,  Tremor, insomnia, depression, anxiety, and suicidal ideation.      Objective:  BP 130/62   Pulse (!) 56   Temp 98.4 F (36.9 C) (Oral)   Ht 5' 4.5" (1.638 m)   Wt 120 lb 6.4 oz (54.6 kg)   SpO2 98%   BMI 20.35 kg/m   BP Readings from Last 3 Encounters:  02/17/23 130/62  09/26/22 112/71  09/18/22 120/80    Wt Readings from Last 3 Encounters:  02/17/23 120 lb 6.4 oz (54.6 kg)  09/26/22 115 lb (52.2 kg)  09/18/22 116 lb (52.6 kg)    Physical Exam Vitals reviewed.  Constitutional:      General: She is not in acute distress.    Appearance: Normal appearance. She is normal weight. She  is not ill-appearing, toxic-appearing or diaphoretic.  HENT:     Head: Normocephalic.  Eyes:     General: No scleral icterus.       Right eye: No discharge.        Left eye: No discharge.     Conjunctiva/sclera: Conjunctivae normal.  Cardiovascular:     Rate and Rhythm: Normal rate and regular rhythm.     Heart sounds: Normal heart sounds.  Pulmonary:     Effort: Pulmonary effort is normal. No respiratory distress.     Breath sounds: Normal breath sounds.  Musculoskeletal:        General: Normal range of motion.  Skin:    General: Skin is  warm and dry.  Neurological:     General: No focal deficit present.     Mental Status: She is alert and oriented to person, place, and time. Mental status is at baseline.  Psychiatric:        Attention and Perception: Attention normal.        Mood and Affect: Mood is depressed.        Speech: Speech normal.        Behavior: Behavior normal.        Thought Content: Thought content normal.        Cognition and Memory: Cognition normal.        Judgment: Judgment normal.    No results found for: "HGBA1C"  Lab Results  Component Value Date   CREATININE 0.75 09/23/2022   CREATININE 0.67 03/29/2022   CREATININE 0.65 04/09/2021    Lab Results  Component Value Date   WBC 3.0 (L) 03/29/2022   HGB 13.4 03/29/2022   HCT 39.5 03/29/2022   PLT 162.0 03/29/2022   GLUCOSE 101 (H) 09/23/2022   CHOL 192 03/29/2022   TRIG 53.0 03/29/2022   HDL 92.20 03/29/2022   LDLCALC 89 03/29/2022   ALT 12 03/29/2022   AST 18 03/29/2022   NA 141 09/23/2022   K 4.2 09/23/2022   CL 107 09/23/2022   CREATININE 0.75 09/23/2022   BUN 21 09/23/2022   CO2 29 09/23/2022   TSH 2.51 05/21/2022    MM 3D SCREEN BREAST BILATERAL  Result Date: 01/24/2023 CLINICAL DATA:  Screening. EXAM: DIGITAL SCREENING BILATERAL MAMMOGRAM WITH TOMOSYNTHESIS AND CAD TECHNIQUE: Bilateral screening digital craniocaudal and mediolateral oblique mammograms were obtained. Bilateral screening digital breast tomosynthesis was performed. The images were evaluated with computer-aided detection. COMPARISON:  Previous exam(s). ACR Breast Density Category b: There are scattered areas of fibroglandular density. FINDINGS: There are no findings suspicious for malignancy. IMPRESSION: No mammographic evidence of malignancy. A result letter of this screening mammogram will be mailed directly to the patient. RECOMMENDATION: Screening mammogram in one year. (Code:SM-B-01Y) BI-RADS CATEGORY  1: Negative. Electronically Signed   By: Abelardo Diesel M.D.    On: 01/24/2023 12:25    Assessment & Plan:  .Episodic lightheadedness Assessment & Plan: Normal cardiac workup  calcium score zero     Thrombocytopenia ([facility])  Neutropenia, unspecified type ([facility]) -     CBC with Differential/Platelet  Postoperative hypothyroidism Assessment & Plan: TSH was increased to 100 mcg daily at last visit. Repeat level is due   Lab Results  Component Value Date   TSH 2.51 05/21/2022     Orders: -     TSH  Moderate mixed hyperlipidemia not requiring statin therapy -     Comprehensive metabolic panel -     Lipid panel -     LDL  cholesterol, direct  Tubular adenoma of colon Assessment & Plan: An 8 mm TA was removed in 2019 ([place]).  Referring to Dr Marius Ditch for 5 yr follow up   Orders: -     Ambulatory referral to Gastroenterology  Pulmonary nodule less than 1 cm in diameter with low risk for malignant neoplasm -     AMB  Referral to Pulmonary Nodule Clinic  Moderate episode of recurrent major depressive disorder [facility]) Assessment & Plan: Resume effexor starting at 37.5 mg daily , starting dose  incraes to 75 mg after two weeks if tolerating    Insomnia due to stress Assessment & Plan: Recommend trial of relaxium , as she wants to avoid hypnotics   Other orders -     Venlafaxine HCl ER; Take 1 capsule (37.5 mg total) by mouth daily with breakfast.  Dispense: 90 capsule; Refill: 1     I provided 30 minutes of face-to-face time during this encounter reviewing patient's last visit with me, patient's  most recent visit with cardiology,  nephrology,  and neurology,  recent surgical and non surgical procedures, previous  labs and imaging studies, counseling on currently addressed issues,  and post visit ordering to diagnostics and therapeutics .   Follow-up: Return in about 6 months (around 08/18/2023).   [name], MD

## 2023-02-17 NOTE — Assessment & Plan Note (Signed)
Resume effexor starting at 37.5 mg daily , starting dose  incraes to 75 mg after two weeks if tolerating

## 2023-02-17 NOTE — Patient Instructions (Addendum)
Annual Wellness Visit due 03/05/2023. Please schedule this appointment at check out.   Start the Effexor  at 1 capsule daily  (37.5 mg ) for the first 2 weeks.  Increase to 2 capsules (75 mg total) after 2 weeks if tolerating medication  and still feeling down  REFERRAL TO DR Sherri Sear AT [facility] FOR COLONOSCOPY MADE  FOR THE INSOMNIA : You might want to try using Relaxium for insomnia  (as seen on TV commercials) . It contains:  Melatonin 5 mg  Chamomile 25 mg Passionflower extract 75 mg GABA 100 mg Ashwaganda extract 125 mg Magnesium citrate, glycinate, oxide (100 mg)  L tryptophan 500 mg Valerest (proprietary  ingredient ; probably valeria root extract)

## 2023-02-18 LAB — COMPREHENSIVE METABOLIC PANEL
ALT: 10 U/L (ref 0–35)
AST: 18 U/L (ref 0–37)
Albumin: 4.1 g/dL (ref 3.5–5.2)
Alkaline Phosphatase: 68 U/L (ref 39–117)
BUN: 23 mg/dL (ref 6–23)
CO2: 29 mEq/L (ref 19–32)
Calcium: 9.1 mg/dL (ref 8.4–10.5)
Chloride: 103 mEq/L (ref 96–112)
Creatinine, Ser: 0.75 mg/dL (ref 0.40–1.20)
GFR: 79.51 mL/min (ref >60.00)
Glucose, Bld: 95 mg/dL (ref 70–99)
Potassium: 4.2 mEq/L (ref 3.5–5.1)
Sodium: 138 mEq/L (ref 135–145)
Total Bilirubin: 0.5 mg/dL (ref 0.2–1.2)
Total Protein: 6.2 g/dL (ref 6.0–8.3)

## 2023-02-18 LAB — CBC WITH DIFFERENTIAL/PLATELET
Basophils Absolute: 0 10*3/uL (ref 0.0–0.1)
Basophils Relative: 0.9 % (ref 0.0–3.0)
Eosinophils Absolute: 0 10*3/uL (ref 0.0–0.7)
Eosinophils Relative: 0.5 % (ref 0.0–5.0)
HCT: 39.7 % (ref 36.0–46.0)
Hemoglobin: 13.3 g/dL (ref 12.0–15.0)
Lymphocytes Relative: 34.6 % (ref 12.0–46.0)
Lymphs Abs: 1.7 10*3/uL (ref 0.7–4.0)
MCHC: 33.6 g/dL (ref 30.0–36.0)
MCV: 94.9 fl (ref 78.0–100.0)
Monocytes Absolute: 0.4 10*3/uL (ref 0.1–1.0)
Monocytes Relative: 7.1 % (ref 3.0–12.0)
Neutro Abs: 2.9 10*3/uL (ref 1.4–7.7)
Neutrophils Relative %: 56.9 % (ref 43.0–77.0)
Platelets: 164 10*3/uL (ref 150.0–400.0)
RBC: 4.18 Mil/uL (ref 3.87–5.11)
RDW: 12.7 % (ref 11.5–15.5)
WBC: 5 10*3/uL (ref 4.0–10.5)

## 2023-02-18 LAB — TSH: TSH: 1.5 u[IU]/mL (ref 0.35–5.50)

## 2023-02-18 LAB — LIPID PANEL
Cholesterol: 186 mg/dL (ref 0–200)
HDL: 90.4 mg/dL (ref >39.00)
LDL Cholesterol: 76 mg/dL (ref 0–99)
NonHDL: 95.15
Total CHOL/HDL Ratio: 2
Triglycerides: 94 mg/dL (ref 0.0–149.0)
VLDL: 18.8 mg/dL (ref 0.0–40.0)

## 2023-02-18 LAB — LDL CHOLESTEROL, DIRECT: Direct LDL: 78 mg/dL

## 2023-02-20 ENCOUNTER — Telehealth: Payer: Self-pay

## 2023-02-20 ENCOUNTER — Encounter: Payer: Self-pay | Admitting: Internal Medicine

## 2023-02-20 NOTE — Telephone Encounter (Signed)
Patient returned phone call to schedule her colonoscopy.  Based on her Health Maintenance her colonoscopy is not due until 10/22/23. Last colonoscopy performed by Dr. Bary Castilla 10/21/18.  Reminder letter has been mailed to patient to call us when she is ready to schedule for October.  Thanks, [place], [place]

## 2023-02-27 ENCOUNTER — Telehealth: Payer: Self-pay | Admitting: Internal Medicine

## 2023-02-27 NOTE — Telephone Encounter (Signed)
[place] to schedule their annual wellness visit. Appointment made for 03/07/2023.  Thank you,  [place] Direct dial  [phone]

## 2023-03-04 ENCOUNTER — Ambulatory Visit (INDEPENDENT_AMBULATORY_CARE_PROVIDER_SITE_OTHER): Payer: Medicare Other | Admitting: Student in an Organized Health Care Education/Training Program

## 2023-03-04 ENCOUNTER — Encounter: Payer: Self-pay | Admitting: Student in an Organized Health Care Education/Training Program

## 2023-03-04 VITALS — BP 134/78 | HR 58 | Temp 98.2°F | Ht 64.5 in | Wt 118.4 lb

## 2023-03-04 DIAGNOSIS — R911 Solitary pulmonary nodule: Secondary | ICD-10-CM | POA: Diagnosis not present

## 2023-03-04 DIAGNOSIS — Z9189 Other specified personal risk factors, not elsewhere classified: Secondary | ICD-10-CM

## 2023-03-04 NOTE — Progress Notes (Signed)
Synopsis: Referred in for pulmonary nodule by Alexis Mc, MD  Assessment & Plan:   1. Pulmonary nodule less than 1 cm in diameter with moderate to high risk for malignant neoplasm  The patient is here to discuss their imaging abnormalities which include a LLL 3 mm nodule that was incidentally noted on coronary CT for workup of CAD. The patient is a former smoker and quit over 20 years ago, and no longer qualifies for low dose CT for lung cancer screening. Given her age and smoking history, the nodule carries a moderate risk of being malignant. We will repeat the CT scan at the 1 year mark to re-evaluate the nodule and ensure stability per Fleischner criteria. Should it be stable, we would cease radiographic surveillance.  - CT CHEST WO CONTRAST; Future   Return in about 7 months (around 09/19/2023).  I spent 45 minutes caring for this patient today, including preparing to see the patient, obtaining a medical history , reviewing a separately obtained history, performing a medically appropriate examination and/or evaluation, counseling and educating the patient/family/caregiver, ordering medications, tests, or procedures, documenting clinical information in the electronic health record, and independently interpreting results (not separately reported/billed) and communicating results to the patient/family/caregiver  Alexis Reichert, MD [facility] 03/04/2023 2:55 PM    End of visit medications:  No orders of the defined types were placed in this encounter.    Current Outpatient Medications:    acetaminophen (TYLENOL 8 HOUR ARTHRITIS PAIN) 650 MG CR tablet, Take 2 tablets (1,300 mg total) by mouth every 8 (eight) hours as needed for pain., Disp: 30 tablet, Rfl: 0   ALPRAZolam (XANAX) 0.25 MG tablet, Take 0.5 tablets (0.125 mg total) by mouth at bedtime as needed for sleep., Disp: 30 tablet, Rfl: 5   cholecalciferol (VITAMIN D3) 25 MCG (1000 UNIT) tablet, Take 1,000  Units by mouth daily., Disp: , Rfl:    fexofenadine-pseudoephedrine (ALLEGRA-D) 60-120 MG 12 hr tablet, Take 1 tablet by mouth daily as needed (allergies)., Disp: , Rfl:    fish oil-omega-3 fatty acids 1000 MG capsule, Take 1 g by mouth daily. , Disp: , Rfl:    levothyroxine (SYNTHROID) 100 MCG tablet, TAKE 1 TABLET BY MOUTH DAILY BEFORE BREAKFAST, Disp: 90 tablet, Rfl: 1   nicotine polacrilex (NICORETTE) 2 MG gum, Take 2 mg by mouth as needed for smoking cessation., Disp: , Rfl:    psyllium (REGULOID) 0.52 g capsule, Take 0.52 g by mouth daily., Disp: , Rfl:    venlafaxine XR (EFFEXOR XR) 37.5 MG 24 hr capsule, Take 1 capsule (37.5 mg total) by mouth daily with breakfast., Disp: 90 capsule, Rfl: 1   Subjective:   PATIENT ID: Alexis Mcdonald GENDER: female DOB: Jul 15, 1951, MRN: [id number]  Chief Complaint  Patient presents with   pulmonary consult    CT 09/28/23-no current sx    HPI  Patient is a pleasant 72 year old female presenting to clinic for the evaluation of an incidental pulmonary nodule.  Patient underwent a CT of the coronaries in September of 2023 secondary to a chief complaint of dizziness. She was found to have a 3 mm LLL pulmonary nodule for which she is referred to [place] today. She is asymptomatic and in her usual state of health. She has no shortness of breath, cough, chest pain, chest tightness, wheezing, night sweats, weight loss, GI or GU symptoms.  Patient is from [place], [place] originally. She moved to [place] and lived there until  around 14 years ago. She moved to [place] to help with the care of her aging parent. She is a former smoker, having smoked a pack a day between the ages of 34 and 85. She quit 32 years ago.  Ancillary information including prior medications, full medical/surgical/family/social histories, and PFTs (when available) are listed below and have been reviewed.   Review of Systems  Constitutional:  Negative for chills, diaphoresis, fever,  malaise/fatigue and weight loss.  Respiratory:  Negative for cough, hemoptysis, sputum production, shortness of breath and wheezing.   Cardiovascular:  Negative for chest pain and PND.  Musculoskeletal:  Negative for myalgias.  Skin:  Negative for rash.  Neurological:  Positive for dizziness.     Objective:   Vitals:   03/04/23 1422  BP: 134/78  Pulse: (!) 58  Temp: 98.2 F (36.8 C)  TempSrc: Temporal  SpO2: 98%  Weight: 118 lb 6.4 oz (53.7 kg)  Height: 5' 4.5" (1.638 m)   98% on RA  BMI Readings from Last 3 Encounters:  03/04/23 20.01 kg/m  02/17/23 20.35 kg/m  09/26/22 19.43 kg/m   Wt Readings from Last 3 Encounters:  03/04/23 118 lb 6.4 oz (53.7 kg)  02/17/23 120 lb 6.4 oz (54.6 kg)  09/26/22 115 lb (52.2 kg)    Physical Exam Constitutional:      General: She is not in acute distress.    Appearance: Normal appearance. She is not ill-appearing.  HENT:     Head: Normocephalic.     Nose: Nose normal.     Mouth/Throat:     Mouth: Mucous membranes are moist.  Cardiovascular:     Rate and Rhythm: Normal rate and regular rhythm.     Pulses: Normal pulses.     Heart sounds: Normal heart sounds.  Pulmonary:     Effort: Pulmonary effort is normal.     Breath sounds: Normal breath sounds.  Musculoskeletal:     Right lower leg: No edema.     Left lower leg: No edema.  Neurological:     General: No focal deficit present.     Mental Status: She is alert and oriented to person, place, and time. Mental status is at baseline.     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   [name]  by March onc eval [name])   History of kidney stones    Hypothyroidism    Kidney stones    Neutropenia    no infections,  bone marrow biopsy by [name]  next week   renal calculi    s/p lithotripsy, [place]   Thyroid disease    hypothyroidism     Family History  Problem Relation Age of Onset   Diabetes Mother    Stroke Mother    Hyperlipidemia Mother    Hypertension Mother    Alcohol abuse Father    Diabetes Brother    Alcohol abuse Brother    Hypertension Brother    Alcohol abuse Brother    Heart disease Maternal Uncle    Breast cancer Neg Hx      Past Surgical History:  Procedure Laterality Date   COLONOSCOPY  03/23/2010   Diverticulosis   COLONOSCOPY W/ BIOPSIES  03/14/2006  Tubular adenoma of the rectum x2, upper lesion with focal high-grade dysplasia.  Polyps 9 mm in size.   COLONOSCOPY WITH PROPOFOL N/A 10/21/2018   Procedure: COLONOSCOPY WITH PROPOFOL;  Surgeon: Robert Bellow, MD;  Location: [facility] ENDOSCOPY;  Service: Endoscopy;  Laterality: N/A;   DILATION AND CURETTAGE OF UTERUS     JOINT REPLACEMENT  2005   done in [place]   kidney stones removal     MELANOMA EXCISION  06/29/2016   right hip replacement     SHOULDER ARTHROSCOPY WITH OPEN ROTATOR CUFF REPAIR Left 12/01/2018   Procedure: SHOULDER ARTHROSCOPY WITH OPEN ROTATOR CUFF REPAIR;  Surgeon: Corky Mull, MD;  Location: [facility] ORS;  Service: Orthopedics;  Laterality: Left;    Social History   Socioeconomic History   Marital status: Divorced    Spouse name: Not on file   Number of children: 2   Years of education: Not on file   Highest education level: Not on file  Occupational History   Occupation: Press photographer    Employer: [employer]  Tobacco Use   Smoking status: Former    Packs/day: 1.00    Years: 42.00    Total pack years: 42.00    Types: Cigarettes    Quit date: 09/24/2000    Years since quitting: 22.4   Smokeless tobacco: Never  Vaping Use   Vaping Use: Never used  Substance and Sexual Activity   Alcohol use: Yes    Comment: "weekends"   Drug use: No   Sexual activity: Yes    Birth control/protection: None  Other Topics  Concern   Not on file  Social History Narrative   Patient lived most her her life in [place] and moved to [place] to help care for aging mother   Social Determinants of Health   Financial Resource Strain: Low Risk  (03/04/2022)   Overall Financial Resource Strain (CARDIA)    Difficulty of Paying Living Expenses: Not hard at all  Food Insecurity: No Food Insecurity (03/04/2022)   Hunger Vital Sign    Worried About Running Out of Food in the Last Year: Never true    [place] in the Last Year: Never true  Transportation Needs: No Transportation Needs (03/04/2022)   PRAPARE - Hydrologist (Medical): No    Lack of Transportation (Non-Medical): No  Physical Activity: Sufficiently Active (03/04/2022)   Exercise Vital Sign    Days of Exercise per Week: 7 days    Minutes of Exercise per Session: 60 min  Stress: No Stress Concern Present (03/04/2022)   [place]    Feeling of Stress : Not at all  Social Connections: Unknown (03/04/2022)   Social Connection and Isolation Panel [NHANES]    Frequency of Communication with Friends and Family: More than three times a week    Frequency of Social Gatherings with Friends and Family: More than three times a week    Attends Religious Services: 1 to 4 times per year    Active Member of [employer] or Organizations: Yes    Attends Archivist Meetings: 1 to 4 times per year    Marital Status: Not on file  Intimate Partner Violence: Not At Risk (03/04/2022)   Humiliation, Afraid, Rape, and Kick questionnaire    Fear of Current or Ex-Partner: No    Emotionally Abused: No    Physically Abused: No    Sexually Abused: No  Allergies  Allergen Reactions   Penicillins     Childhood allergy, welts Has patient had a PCN reaction causing immediate rash, facial/tongue/throat swelling, SOB or lightheadedness with hypotension: No Has patient had a PCN reaction causing  severe rash involving mucus membranes or skin necrosis: Yes Has patient had a PCN reaction that required hospitalization: No Has patient had a PCN reaction occurring within the last 10 years: No If all of the above answers are "NO", then may proceed with Cephalosporin use.    Butenafine Rash    Morbilliform rash coverin entire body  June 2014   Terbinafine And Related Rash    Morbilliform rash coverin entire body  June 2014     CBC    Component Value Date/Time   WBC 5.0 02/17/2023 1520   RBC 4.18 02/17/2023 1520   HGB 13.3 02/17/2023 1520   HGB 13.1 02/18/2014 1400   HCT 39.7 02/17/2023 1520   HCT 40.1 02/18/2014 1400   PLT 164.0 02/17/2023 1520   PLT 151 02/18/2014 1400   MCV 94.9 02/17/2023 1520   MCV 93 02/18/2014 1400   MCH 32.0 04/09/2021 1434   MCHC 33.6 02/17/2023 1520   RDW 12.7 02/17/2023 1520   RDW 12.8 02/18/2014 1400   LYMPHSABS 1.7 02/17/2023 1520   LYMPHSABS 1.3 02/18/2014 1400   MONOABS 0.4 02/17/2023 1520   MONOABS 0.3 02/18/2014 1400   EOSABS 0.0 02/17/2023 1520   EOSABS 0.0 02/18/2014 1400   BASOSABS 0.0 02/17/2023 1520   BASOSABS 0.0 02/18/2014 1400    Pulmonary Functions Testing Results:     No data to display          Outpatient Medications Prior to Visit  Medication Sig Dispense Refill   acetaminophen (TYLENOL 8 HOUR ARTHRITIS PAIN) 650 MG CR tablet Take 2 tablets (1,300 mg total) by mouth every 8 (eight) hours as needed for pain. 30 tablet 0   ALPRAZolam (XANAX) 0.25 MG tablet Take 0.5 tablets (0.125 mg total) by mouth at bedtime as needed for sleep. 30 tablet 5   cholecalciferol (VITAMIN D3) 25 MCG (1000 UNIT) tablet Take 1,000 Units by mouth daily.     fexofenadine-pseudoephedrine (ALLEGRA-D) 60-120 MG 12 hr tablet Take 1 tablet by mouth daily as needed (allergies).     fish oil-omega-3 fatty acids 1000 MG capsule Take 1 g by mouth daily.      levothyroxine (SYNTHROID) 100 MCG tablet TAKE 1 TABLET BY MOUTH DAILY BEFORE BREAKFAST 90 tablet  1   nicotine polacrilex (NICORETTE) 2 MG gum Take 2 mg by mouth as needed for smoking cessation.     psyllium (REGULOID) 0.52 g capsule Take 0.52 g by mouth daily.     venlafaxine XR (EFFEXOR XR) 37.5 MG 24 hr capsule Take 1 capsule (37.5 mg total) by mouth daily with breakfast. 90 capsule 1   No facility-administered medications prior to visit.

## 2023-03-05 ENCOUNTER — Ambulatory Visit (INDEPENDENT_AMBULATORY_CARE_PROVIDER_SITE_OTHER): Payer: Medicare Other | Admitting: Family Medicine

## 2023-03-05 ENCOUNTER — Encounter: Payer: Self-pay | Admitting: Family Medicine

## 2023-03-05 VITALS — BP 122/74 | HR 52 | Temp 97.9°F | Ht 64.5 in | Wt 116.6 lb

## 2023-03-05 DIAGNOSIS — R1012 Left upper quadrant pain: Secondary | ICD-10-CM | POA: Diagnosis not present

## 2023-03-05 LAB — COMPREHENSIVE METABOLIC PANEL
ALT: 11 U/L (ref 0–35)
AST: 19 U/L (ref 0–37)
Albumin: 4 g/dL (ref 3.5–5.2)
Alkaline Phosphatase: 63 U/L (ref 39–117)
BUN: 23 mg/dL (ref 6–23)
CO2: 28 mEq/L (ref 19–32)
Calcium: 9 mg/dL (ref 8.4–10.5)
Chloride: 102 mEq/L (ref 96–112)
Creatinine, Ser: 0.72 mg/dL (ref 0.40–1.20)
GFR: 83.48 mL/min (ref >60.00)
Glucose, Bld: 90 mg/dL (ref 70–99)
Potassium: 4.2 mEq/L (ref 3.5–5.1)
Sodium: 137 mEq/L (ref 135–145)
Total Bilirubin: 0.6 mg/dL (ref 0.2–1.2)
Total Protein: 6 g/dL (ref 6.0–8.3)

## 2023-03-05 LAB — CBC WITH DIFFERENTIAL/PLATELET
Basophils Absolute: 0 10*3/uL (ref 0.0–0.1)
Basophils Relative: 0.4 % (ref 0.0–3.0)
Eosinophils Absolute: 0 10*3/uL (ref 0.0–0.7)
Eosinophils Relative: 0.7 % (ref 0.0–5.0)
HCT: 40.3 % (ref 36.0–46.0)
Hemoglobin: 13.4 g/dL (ref 12.0–15.0)
Lymphocytes Relative: 47.7 % — ABNORMAL HIGH (ref 12.0–46.0)
Lymphs Abs: 1.5 10*3/uL (ref 0.7–4.0)
MCHC: 33.3 g/dL (ref 30.0–36.0)
MCV: 94.6 fl (ref 78.0–100.0)
Monocytes Absolute: 0.3 10*3/uL (ref 0.1–1.0)
Monocytes Relative: 9.1 % (ref 3.0–12.0)
Neutro Abs: 1.4 10*3/uL (ref 1.4–7.7)
Neutrophils Relative %: 42.1 % — ABNORMAL LOW (ref 43.0–77.0)
Platelets: 154 10*3/uL (ref 150.0–400.0)
RBC: 4.26 Mil/uL (ref 3.87–5.11)
RDW: 12.8 % (ref 11.5–15.5)
WBC: 3.2 10*3/uL — ABNORMAL LOW (ref 4.0–10.5)

## 2023-03-05 LAB — POCT URINALYSIS DIPSTICK
Bilirubin, UA: NEGATIVE
Glucose, UA: NEGATIVE
Ketones, UA: NEGATIVE
Leukocytes, UA: NEGATIVE
Nitrite, UA: NEGATIVE
Protein, UA: NEGATIVE
Spec Grav, UA: 1.025 (ref 1.010–1.025)
Urobilinogen, UA: NEGATIVE E.U./dL — AB
pH, UA: 5.5 (ref 5.0–8.0)

## 2023-03-05 LAB — LIPASE: Lipase: 10 U/L — ABNORMAL LOW (ref 11.0–59.0)

## 2023-03-05 NOTE — Progress Notes (Signed)
Tommi Rumps, MD Phone: [phone]  Alexis Mcdonald is a 72 y.o. female who presents today for same day visit.   Abdominal pain: Patient notes left upper quadrant abdominal pain that started about 3 weeks ago.  It was constant for a week and was more severe initially.  Now it is intermittent and is less painful.  Notes it is a 3/10.  No nausea, vomiting, diarrhea, constipation, dysuria, urinary urgency, vaginal bleeding, vaginal discharge, or fevers.  She notes having daily bowel movements with no straining.  Her bowel movements are dark brown with no blood.  She notes no exacerbating factors.  She notes if she stretches she gets some relief.  She notes doing sit ups does not bother it.  She notes she still has her gallbladder, appendix, ovaries, and uterus.  She does note a little bit of increased urinary frequency recently.  Social History   Tobacco Use  Smoking Status Former   Packs/day: 1.00   Years: 42.00   Total pack years: 42.00   Types: Cigarettes   Quit date: 09/24/2000   Years since quitting: 22.4  Smokeless Tobacco Never    Current Outpatient Medications on File Prior to Visit  Medication Sig Dispense Refill   acetaminophen (TYLENOL 8 HOUR ARTHRITIS PAIN) 650 MG CR tablet Take 2 tablets (1,300 mg total) by mouth every 8 (eight) hours as needed for pain. 30 tablet 0   ALPRAZolam (XANAX) 0.25 MG tablet Take 0.5 tablets (0.125 mg total) by mouth at bedtime as needed for sleep. 30 tablet 5   cholecalciferol (VITAMIN D3) 25 MCG (1000 UNIT) tablet Take 1,000 Units by mouth daily.     fexofenadine-pseudoephedrine (ALLEGRA-D) 60-120 MG 12 hr tablet Take 1 tablet by mouth daily as needed (allergies).     fish oil-omega-3 fatty acids 1000 MG capsule Take 1 g by mouth daily.      levothyroxine (SYNTHROID) 100 MCG tablet TAKE 1 TABLET BY MOUTH DAILY BEFORE BREAKFAST 90 tablet 1   nicotine polacrilex (NICORETTE) 2 MG gum Take 2 mg by mouth as needed for smoking cessation.      psyllium (REGULOID) 0.52 g capsule Take 0.52 g by mouth daily.     venlafaxine XR (EFFEXOR XR) 37.5 MG 24 hr capsule Take 1 capsule (37.5 mg total) by mouth daily with breakfast. 90 capsule 1   No current facility-administered medications on file prior to visit.     ROS see history of present illness  Objective  Physical Exam Vitals:   03/05/23 0807  BP: 122/74  Pulse: (!) 52  Temp: 97.9 F (36.6 C)  SpO2: 99%    BP Readings from Last 3 Encounters:  03/05/23 122/74  03/04/23 134/78  02/17/23 130/62   Wt Readings from Last 3 Encounters:  03/05/23 116 lb 9.6 oz (52.9 kg)  03/04/23 118 lb 6.4 oz (53.7 kg)  02/17/23 120 lb 6.4 oz (54.6 kg)    Physical Exam Constitutional:      General: She is not in acute distress.    Appearance: She is not diaphoretic.  Cardiovascular:     Rate and Rhythm: Normal rate and regular rhythm.     Heart sounds: Normal heart sounds.  Pulmonary:     Effort: Pulmonary effort is normal.     Breath sounds: Normal breath sounds.  Abdominal:     General: Bowel sounds are normal. There is no distension.     Palpations: Abdomen is soft.     Tenderness: There is no abdominal tenderness.  Skin:    General: Skin is warm and dry.  Neurological:     Mental Status: She is alert.      Assessment/Plan: Please see individual problem list.  LUQ pain Assessment & Plan: Patient with recent onset left upper quadrant pain that has improved some though still persists.  Discussed that this could represent an issue with her colon, pancreas, stomach, spleen, liver, or gallbladder.  Discussed that liver and gallbladder issues would be unlikely given the location.  Her spleen is not enlarged on exam.  Discussed we would start with lab work and urine testing today.  Discussed if there is no obvious cause on her labs or urine testing would consider imaging.  Advised to seek medical attention if the abdominal pain becomes severe, she develops fever, or she  starts to pass blood in her stool.  Orders: -     POCT urinalysis dipstick -     Comprehensive metabolic panel -     Lipase -     CBC with Differential/Platelet    Return if symptoms worsen or fail to improve.   Tommi Rumps, MD [place]

## 2023-03-05 NOTE — Patient Instructions (Signed)
Nice to see you. Will get lab work and urine testing today and contact you with the results.  If the abdominal pain becomes severe, you start having fevers, or you start passing blood in your stool please seek medical attention again. The Effexor does have of increased urination as a possible side effect.  You may need to discuss that with Dr. Derrel Nip if it continues to be an issue.

## 2023-03-05 NOTE — Addendum Note (Signed)
Addended by: Jeralyn Bennett A on: 03/05/2023 11:31 AM   Modules accepted: Orders

## 2023-03-05 NOTE — Assessment & Plan Note (Signed)
Patient with recent onset left upper quadrant pain that has improved some though still persists.  Discussed that this could represent an issue with her colon, pancreas, stomach, spleen, liver, or gallbladder.  Discussed that liver and gallbladder issues would be unlikely given the location.  Her spleen is not enlarged on exam.  Discussed we would start with lab work and urine testing today.  Discussed if there is no obvious cause on her labs or urine testing would consider imaging.  Advised to seek medical attention if the abdominal pain becomes severe, she develops fever, or she starts to pass blood in her stool.

## 2023-03-06 ENCOUNTER — Encounter: Payer: Self-pay | Admitting: Family Medicine

## 2023-03-06 DIAGNOSIS — F413 Other mixed anxiety disorders: Secondary | ICD-10-CM | POA: Diagnosis not present

## 2023-03-07 ENCOUNTER — Ambulatory Visit (INDEPENDENT_AMBULATORY_CARE_PROVIDER_SITE_OTHER): Payer: Medicare Other

## 2023-03-07 VITALS — Ht 64.5 in | Wt 116.0 lb

## 2023-03-07 DIAGNOSIS — Z0000 Encounter for general adult medical examination without abnormal findings: Secondary | ICD-10-CM | POA: Diagnosis not present

## 2023-03-07 NOTE — Progress Notes (Addendum)
Subjective:   Alexis Mcdonald is a 72 y.o. female who presents for Medicare Annual (Subsequent) preventive examination.  Review of Systems    No ROS.  Medicare Wellness Virtual Visit.  Visual/audio telehealth visit, UTA vital signs.   See social history for additional risk factors.   Cardiac Risk Factors include: advanced age (>61mn, >>9women)     Objective:    Today's Vitals   03/07/23 1234  Weight: 116 lb (52.6 kg)  Height: 5' 4.5" (1.638 m)   Body mass index is 19.6 kg/m.     03/07/2023   12:36 PM 03/04/2022    2:13 PM 04/09/2021    2:04 PM 02/09/2021   10:36 AM 02/09/2020   10:41 AM 02/04/2019   11:50 AM 12/01/2018   11:13 AM  Advanced Directives  Does Patient Have a Medical Advance Directive? Yes Yes Yes Yes Yes Yes Yes  Type of AParamedicof ALaresLiving will H[place]leyLiving will H[place]neLiving will HWahpetonLiving will HSociety HillLiving will HPhilipsburgLiving will HHartvilleLiving will  Does patient want to make changes to medical advance directive? No - Patient declined No - Patient declined Yes (ED - Information included in AVS) No - Patient declined No - Patient declined No - Patient declined No - Patient declined  Copy of H[place]viewin Chart? Yes - validated most recent copy scanned in chart (See row information) Yes - validated most recent copy scanned in chart (See row information)  Yes - validated most recent copy scanned in chart (See row information) Yes - validated most recent copy scanned in chart (See row information) No - copy requested No - copy requested    Current Medications (verified) Outpatient Encounter Medications as of 03/07/2023  Medication Sig   acetaminophen (TYLENOL 8 HOUR ARTHRITIS PAIN) 650 MG CR tablet Take 2 tablets (1,300 mg total) by mouth every 8 (eight) hours as needed  for pain.   ALPRAZolam (XANAX) 0.25 MG tablet Take 0.5 tablets (0.125 mg total) by mouth at bedtime as needed for sleep.   cholecalciferol (VITAMIN D3) 25 MCG (1000 UNIT) tablet Take 1,000 Units by mouth daily.   fexofenadine-pseudoephedrine (ALLEGRA-D) 60-120 MG 12 hr tablet Take 1 tablet by mouth daily as needed (allergies).   fish oil-omega-3 fatty acids 1000 MG capsule Take 1 g by mouth daily.    levothyroxine (SYNTHROID) 100 MCG tablet TAKE 1 TABLET BY MOUTH DAILY BEFORE BREAKFAST   nicotine polacrilex (NICORETTE) 2 MG gum Take 2 mg by mouth as needed for smoking cessation.   psyllium (REGULOID) 0.52 g capsule Take 0.52 g by mouth daily.   venlafaxine XR (EFFEXOR XR) 37.5 MG 24 hr capsule Take 1 capsule (37.5 mg total) by mouth daily with breakfast.   No facility-administered encounter medications on file as of 03/07/2023.    Allergies (verified) Penicillins, Butenafine, and Terbinafine and related   History: Past Medical History:  Diagnosis Date   Borderline osteopenia    [facility]    Melanoma removed from right hip   Colon polyp 2009   next one due 2012   COVID-19 06/05/2021   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   [name]  by March onc eval (Alexis Mcdonald   History of kidney stones    Hypothyroidism    Kidney stones    Neutropenia    no infections,  bone marrow biopsy  by Alexis Mcdonald next week   renal calculi    s/p lithotripsy, [place]   Thyroid disease    hypothyroidism   Past Surgical History:  Procedure Laterality Date   COLONOSCOPY  03/23/2010   Diverticulosis   COLONOSCOPY W/ BIOPSIES  03/14/2006   Tubular adenoma of the rectum x2, upper lesion with focal high-grade dysplasia.  Polyps 9 mm in size.   COLONOSCOPY WITH PROPOFOL N/A 10/21/2018   Procedure: COLONOSCOPY WITH PROPOFOL;  Surgeon: Alexis Bellow, MD;  Location: [facility] ENDOSCOPY;  Service: Endoscopy;  Laterality: N/A;   DILATION AND CURETTAGE OF UTERUS     JOINT REPLACEMENT  2005    done in [place]   kidney stones removal     MELANOMA EXCISION  06/29/2016   right hip replacement     SHOULDER ARTHROSCOPY WITH OPEN ROTATOR CUFF REPAIR Left 12/01/2018   Procedure: SHOULDER ARTHROSCOPY WITH OPEN ROTATOR CUFF REPAIR;  Surgeon: Alexis Mull, MD;  Location: [facility] ORS;  Service: Orthopedics;  Laterality: Left;   Family History  Problem Relation Age of Onset   Diabetes Mother    Stroke Mother    Hyperlipidemia Mother    Hypertension Mother    Alcohol abuse Father    Diabetes Brother    Alcohol abuse Brother    Hypertension Brother    Alcohol abuse Brother    Heart disease Maternal Uncle    Breast cancer Neg Hx    Social History   Socioeconomic History   Marital status: Divorced    Spouse name: Not on file   Number of children: 2   Years of education: Not on file   Highest education level: Not on file  Occupational History   Occupation: Press photographer    Employer: [employer]  Tobacco Use   Smoking status: Former    Packs/day: 1.00    Years: 42.00    Total pack years: 42.00    Types: Cigarettes    Quit date: 09/24/2000    Years since quitting: 22.4   Smokeless tobacco: Never  Vaping Use   Vaping Use: Never used  Substance and Sexual Activity   Alcohol use: Yes    Comment: "weekends"   Drug use: No   Sexual activity: Yes    Birth control/protection: None  Other Topics Concern   Not on file  Social History Narrative   Patient lived most her her life in [place] and moved to [place] to help care for aging mother   Social Determinants of Health   Financial Resource Strain: Low Risk  (03/03/2023)   Overall Financial Resource Strain (CARDIA)    Difficulty of Paying Living Expenses: Not hard at all  Food Insecurity: No Food Insecurity (03/03/2023)   Hunger Vital Sign    Worried About Running Out of Food in the Last Year: Never true    [place] in the Last Year: Never true  Transportation Needs: No Transportation Needs (03/03/2023)   PRAPARE -  Hydrologist (Medical): No    Lack of Transportation (Non-Medical): No  Physical Activity: Sufficiently Active (03/03/2023)   Exercise Vital Sign    Days of Exercise per Week: 7 days    Minutes of Exercise per Session: 60 min  Stress: Stress Concern Present (03/03/2023)   [place]    Feeling of Stress : To some extent  Social Connections: Unknown (03/03/2023)   Social Connection and Isolation Panel [NHANES]  Frequency of Communication with Friends and Family: More than three times a week    Frequency of Social Gatherings with Friends and Family: Twice a week    Attends Religious Services: Not on Advertising copywriter or Organizations: No    Attends Archivist Meetings: Never    Marital Status: Divorced    Tobacco Counseling Counseling given: Not Answered   Clinical Intake:  Pre-visit preparation completed: Yes        Diabetes: No  How often do you need to have someone help you when you read instructions, pamphlets, or other written materials from your doctor or pharmacy?: 1 - Never    Interpreter Needed?: No      Activities of Daily Living    03/03/2023   11:28 AM  In your present state of health, do you have any difficulty performing the following activities:  Hearing? 0  Vision? 0  Difficulty concentrating or making decisions? 0  Walking or climbing stairs? 0  Dressing or bathing? 0  Doing errands, shopping? 0  Preparing Food and eating ? N  Using the Toilet? N  In the past six months, have you accidently leaked urine? N  Do you have problems with loss of bowel control? N  Managing your Medications? N  Managing your Finances? N  Housekeeping or managing your Housekeeping? N    Patient Care Team: [name], MD as PCP - General (Internal Medicine) Christene Lye, MD (General Surgery)  Indicate any recent Medical Services you may  have received from other than [facility] providers in the past year (date may be approximate).     Assessment:   This is a routine wellness examination for Alexis Mcdonald.  Patient Medicare AWV questionnaire was completed by the patient on 03/05/23, I have confirmed that all information answered by patient is correct and no changes since this date.   I connected with  Alexis Mcdonald on 03/07/23 by a audio enabled telemedicine application and verified that I am speaking with the correct person using two identifiers.  Patient Location: Home  Provider Location: Office/Clinic  I discussed the limitations of evaluation and management by telemedicine. The patient expressed understanding and agreed to proceed.   Hearing/Vision screen Hearing Screening - Comments:: Hearing loss L ear Tinnitus No hearing aids   Vision Screening - Comments:: [facility] Wears corrective lenses They have seen their ophthalmologist.    Dietary issues and exercise activities discussed: Current Exercise Habits: Home exercise routine, Type of exercise: walking;yoga;strength training/weights;treadmill (swimming, pilates), Frequency (Times/Week): 7, Intensity: Mild  Vegetarian  Good water intake Tracks/monitors protein intake   Goals Addressed               This Visit's Progress     Patient Stated     Maintain Healthy Lifestyle (pt-stated)        Continue personal physical activity Healthy diet Continue to live independently       Depression Screen    03/07/2023   12:48 PM 03/05/2023    8:08 AM 02/17/2023    2:39 PM 07/30/2022    2:26 PM 07/19/2022    9:57 AM 03/04/2022    2:11 PM 06/05/2021    4:30 PM  PHQ 2/9 Scores  PHQ - 2 Score 0 2 4 0 0 0 0  PHQ- 9 Score 0 '4 7 1     '$ Exception Documentation Other- indicate reason in comment box  Not completed Visits every 2 weeks          Fall Risk    03/05/2023    8:08 AM 03/03/2023   11:28 AM 02/17/2023    2:39 PM 07/30/2022     2:26 PM 07/19/2022    9:57 AM  Fall Risk   Falls in the past year? 0 0 0 0 1  Number falls in past yr: 0 0 0  1  Injury with Fall? 0 0 0    Risk for fall due to : No Fall Risks  No Fall Risks No Fall Risks   Follow up Falls evaluation completed Falls evaluation completed;Falls prevention discussed Falls evaluation completed Falls evaluation completed     FALL RISK PREVENTION PERTAINING TO THE HOME: Home free of loose throw rugs in walkways, pet beds, electrical cords, etc? Yes  Adequate lighting in your home to reduce risk of falls? Yes   ASSISTIVE DEVICES UTILIZED TO PREVENT FALLS: Life alert? No  Use of a cane, walker or w/c? No  Grab bars in the bathroom? Yes Shower chair or bench in shower? No  Elevated toilet seat or a handicapped toilet? No   TIMED UP AND GO: Was the test performed? No .   Cognitive Function:        03/07/2023   12:50 PM 03/04/2022    2:19 PM 02/09/2020   10:49 AM 02/04/2019   12:59 PM  6CIT Screen  What Year? 0 points 0 points 0 points 0 points  What month? 0 points 0 points 0 points 0 points  What time? 0 points 0 points 0 points 0 points  Count back from 20 0 points 0 points 0 points 0 points  Months in reverse 0 points 0 points 0 points 0 points  Repeat phrase 0 points 0 points 0 points 0 points  Total Score 0 points 0 points 0 points 0 points    Immunizations Immunization History  Administered Date(s) Administered   Influenza, High Dose Seasonal PF 10/14/2019   Influenza,inj,Quad PF,6+ Mos 09/24/2014, 10/10/2015, 08/20/2016   Influenza-Unspecified 09/29/2012, 09/29/2017, 09/08/2018, 09/18/2020, 10/26/2022   PFIZER(Purple Top)SARS-COV-2 Vaccination 02/10/2020, 03/02/2020, 09/27/2020, 09/21/2021, 10/18/2022   PNEUMOCOCCAL CONJUGATE-20 03/23/2021   Pfizer Covid-19 Vaccine Bivalent Booster 5y-11y 05/19/2021   Pneumococcal Conjugate-13 09/29/2017   Pneumococcal Polysaccharide-23 02/04/2019   Tdap 05/07/2013   Zoster Recombinat (Shingrix)  01/13/2022, 03/13/2022   Zoster, Live 06/10/2013   Covid-19 vaccine status: Completed vaccines  Screening Tests Health Maintenance  Topic Date Due   COVID-19 Vaccine (7 - 2023-24 season) 03/23/2023 (Originally 12/13/2022)   Hepatitis C Screening  03/29/2023 (Originally 08/30/1969)   DTaP/Tdap/Td (2 - Td or Tdap) 05/08/2023   COLONOSCOPY (Pts 45-58yr Insurance coverage will need to be confirmed)  10/22/2023   MAMMOGRAM  01/23/2024   Medicare Annual Wellness (AWV)  03/06/2024   Pneumonia Vaccine 72 Years old  Completed   INFLUENZA VACCINE  Completed   DEXA SCAN  Completed   Zoster Vaccines- Shingrix  Completed   HPV VACCINES  Aged Out    Health Maintenance  There are no preventive care reminders to display for this patient.  CT Chest WO Contrast- scheduled 10/01/23   Hepatitis C Screening: deferred.   Vision Screening: Recommended annual ophthalmology exams for early detection of glaucoma and other disorders of the eye.  Dental Screening: Recommended annual dental exams for proper oral hygiene.  Community Resource Referral / Chronic Care Management: CRR required this visit?  No   CCM required this visit?  No      Plan:     I have personally reviewed and noted the following in the patient's chart:   Medical and social history Use of alcohol, tobacco or illicit drugs  Current medications and supplements including opioid prescriptions. Patient is not currently taking opioid prescriptions. Functional ability and status Nutritional status Physical activity Advanced directives List of other physicians Hospitalizations, surgeries, and ER visits in previous 12 months Vitals Screenings to include cognitive, depression, and falls Referrals and appointments  In addition, I have reviewed and discussed with patient certain preventive protocols, quality metrics, and best practice recommendations. A written personalized care plan for preventive services as well as general  preventive health recommendations were provided to patient.     [place], LPN   [id number]      I have reviewed the above information and agree with above.   Deborra Medina, MD

## 2023-03-07 NOTE — Patient Instructions (Addendum)
[name] , Thank you for taking time to come for your Medicare Wellness Visit. I appreciate your ongoing commitment to your health goals. Please review the following plan we discussed and let me know if I can assist you in the future.   These are the goals we discussed:  Goals       Patient Stated     Maintain Healthy Lifestyle (pt-stated)      Continue personal physical activity Healthy diet Continue to live independently        This is a list of the screening recommended for you and due dates:  Health Maintenance  Topic Date Due   COVID-19 Vaccine (7 - 2023-24 season) 03/23/2023*   Hepatitis C Screening: USPSTF Recommendation to screen - Ages 18-79 yo.  03/29/2023*   DTaP/Tdap/Td vaccine (2 - Td or Tdap) 05/08/2023   Colon Cancer Screening  10/22/2023   Mammogram  01/23/2024   Medicare Annual Wellness Visit  03/06/2024   Pneumonia Vaccine  Completed   Flu Shot  Completed   DEXA scan (bone density measurement)  Completed   Zoster (Shingles) Vaccine  Completed   HPV Vaccine  Aged Out  *Topic was postponed. The date shown is not the original due date.    Advanced directives: on file.   Conditions/risks identified: none new  Next appointment: Follow up in one year for your annual wellness visit    Preventive Care 65 Years and Older, Female Preventive care refers to lifestyle choices and visits with your health care provider that can promote health and wellness. What does preventive care include? A yearly physical exam. This is also called an annual well check. Dental exams once or twice a year. Routine eye exams. Ask your health care provider how often you should have your eyes checked. Personal lifestyle choices, including: Daily care of your teeth and gums. Regular physical activity. Eating a healthy diet. Avoiding tobacco and drug use. Limiting alcohol use. Practicing safe sex. Taking low-dose aspirin every day. Taking vitamin and mineral supplements  as recommended by your health care provider. What happens during an annual well check? The services and screenings done by your health care provider during your annual well check will depend on your age, overall health, lifestyle risk factors, and family history of disease. Counseling  Your health care provider may ask you questions about your: Alcohol use. Tobacco use. Drug use. Emotional well-being. Home and relationship well-being. Sexual activity. Eating habits. History of falls. Memory and ability to understand (cognition). Work and work Statistician. Reproductive health. Screening  You may have the following tests or measurements: Height, weight, and BMI. Blood pressure. Lipid and cholesterol levels. These may be checked every 5 years, or more frequently if you are over 17 years old. Skin check. Lung cancer screening. You may have this screening every year starting at age 70 if you have a 30-pack-year history of smoking and currently smoke or have quit within the past 15 years. Fecal occult blood test (FOBT) of the stool. You may have this test every year starting at age 78. Flexible sigmoidoscopy or colonoscopy. You may have a sigmoidoscopy every 5 years or a colonoscopy every 10 years starting at age 79. Hepatitis C blood test. Hepatitis B blood test. Sexually transmitted disease (STD) testing. Diabetes screening. This is done by checking your blood sugar (glucose) after you have not eaten for a while (fasting). You may have this done every 1-3 years. Bone density scan. This is done to screen for osteoporosis.  You may have this done starting at age 61. Mammogram. This may be done every 1-2 years. Talk to your health care provider about how often you should have regular mammograms. Talk with your health care provider about your test results, treatment options, and if necessary, the need for more tests. Vaccines  Your health care provider may recommend certain vaccines, such  as: Influenza vaccine. This is recommended every year. Tetanus, diphtheria, and acellular pertussis (Tdap, Td) vaccine. You may need a Td booster every 10 years. Zoster vaccine. You may need this after age 32. Pneumococcal 13-valent conjugate (PCV13) vaccine. One dose is recommended after age 66. Pneumococcal polysaccharide (PPSV23) vaccine. One dose is recommended after age 61. Talk to your health care provider about which screenings and vaccines you need and how often you need them. This information is not intended to replace advice given to you by your health care provider. Make sure you discuss any questions you have with your health care provider. Document Released: 01/12/2016 Document Revised: 09/04/2016 Document Reviewed: 10/17/2015 Elsevier Interactive Patient Education  2017 [place] Prevention in the Home Falls can cause injuries. They can happen to people of all ages. There are many things you can do to make your home safe and to help prevent falls. What can I do on the outside of my home? Regularly fix the edges of walkways and driveways and fix any cracks. Remove anything that might make you trip as you walk through a door, such as a raised step or threshold. Trim any bushes or trees on the path to your home. Use bright outdoor lighting. Clear any walking paths of anything that might make someone trip, such as rocks or tools. Regularly check to see if handrails are loose or broken. Make sure that both sides of any steps have handrails. Any raised decks and porches should have guardrails on the edges. Have any leaves, snow, or ice cleared regularly. Use sand or salt on walking paths during winter. Clean up any spills in your garage right away. This includes oil or grease spills. What can I do in the bathroom? Use night lights. Install grab bars by the toilet and in the tub and shower. Do not use towel bars as grab bars. Use non-skid mats or decals in the tub or  shower. If you need to sit down in the shower, use a plastic, non-slip stool. Keep the floor dry. Clean up any water that spills on the floor as soon as it happens. Remove soap buildup in the tub or shower regularly. Attach bath mats securely with double-sided non-slip rug tape. Do not have throw rugs and other things on the floor that can make you trip. What can I do in the bedroom? Use night lights. Make sure that you have a light by your bed that is easy to reach. Do not use any sheets or blankets that are too big for your bed. They should not hang down onto the floor. Have a firm chair that has side arms. You can use this for support while you get dressed. Do not have throw rugs and other things on the floor that can make you trip. What can I do in the kitchen? Clean up any spills right away. Avoid walking on wet floors. Keep items that you use a lot in easy-to-reach places. If you need to reach something above you, use a strong step stool that has a grab bar. Keep electrical cords out of the way. Do not use  floor polish or wax that makes floors slippery. If you must use wax, use non-skid floor wax. Do not have throw rugs and other things on the floor that can make you trip. What can I do with my stairs? Do not leave any items on the stairs. Make sure that there are handrails on both sides of the stairs and use them. Fix handrails that are broken or loose. Make sure that handrails are as long as the stairways. Check any carpeting to make sure that it is firmly attached to the stairs. Fix any carpet that is loose or worn. Avoid having throw rugs at the top or bottom of the stairs. If you do have throw rugs, attach them to the floor with carpet tape. Make sure that you have a light switch at the top of the stairs and the bottom of the stairs. If you do not have them, ask someone to add them for you. What else can I do to help prevent falls? Wear shoes that: Do not have high heels. Have  rubber bottoms. Are comfortable and fit you well. Are closed at the toe. Do not wear sandals. If you use a stepladder: Make sure that it is fully opened. Do not climb a closed stepladder. Make sure that both sides of the stepladder are locked into place. Ask someone to hold it for you, if possible. Clearly mark and make sure that you can see: Any grab bars or handrails. First and last steps. Where the edge of each step is. Use tools that help you move around (mobility aids) if they are needed. These include: Canes. Walkers. Scooters. Crutches. Turn on the lights when you go into a dark area. Replace any light bulbs as soon as they burn out. Set up your furniture so you have a clear path. Avoid moving your furniture around. If any of your floors are uneven, fix them. If there are any pets around you, be aware of where they are. Review your medicines with your doctor. Some medicines can make you feel dizzy. This can increase your chance of falling. Ask your doctor what other things that you can do to help prevent falls. This information is not intended to replace advice given to you by your health care provider. Make sure you discuss any questions you have with your health care provider. Document Released: 10/12/2009 Document Revised: 05/23/2016 Document Reviewed: 01/20/2015 Elsevier Interactive Patient Education  2017 [employer].

## 2023-03-10 ENCOUNTER — Telehealth: Payer: Self-pay | Admitting: Internal Medicine

## 2023-03-10 ENCOUNTER — Telehealth: Payer: Self-pay | Admitting: Family Medicine

## 2023-03-10 DIAGNOSIS — R1012 Left upper quadrant pain: Secondary | ICD-10-CM

## 2023-03-10 DIAGNOSIS — R8290 Unspecified abnormal findings in urine: Secondary | ICD-10-CM

## 2023-03-10 NOTE — Telephone Encounter (Signed)
Please let the patient know that it looks like her urine was not sent for culture last week.  Can she come in and provide [place] with another urine sample.  I have also ordered her CT scan and somebody should be contacting her to get this scheduled.

## 2023-03-10 NOTE — Telephone Encounter (Signed)
Called pt to get scheduled for a lab appt to give another sample of urine; and to informt hat Dr. Biagio Quint oprdered a CT scan pt stated she was on the phone trying to get scheduled for the CT scan and placed me on hold I let pt go so she can just CB to get scheduled for urine lab.

## 2023-03-10 NOTE — Telephone Encounter (Signed)
Pt called in asking to speak to Alexis Mcdonald, unable to transfer. As per pt she just have some questions regarding a CT appt. She's available [phone].

## 2023-03-11 ENCOUNTER — Other Ambulatory Visit (INDEPENDENT_AMBULATORY_CARE_PROVIDER_SITE_OTHER): Payer: Medicare Other

## 2023-03-11 DIAGNOSIS — R8290 Unspecified abnormal findings in urine: Secondary | ICD-10-CM | POA: Diagnosis not present

## 2023-03-11 LAB — URINALYSIS, MICROSCOPIC ONLY

## 2023-03-12 LAB — URINE CULTURE
MICRO NUMBER:: 14681313
Result:: NO GROWTH
SPECIMEN QUALITY:: ADEQUATE

## 2023-03-17 ENCOUNTER — Encounter: Payer: Self-pay | Admitting: Internal Medicine

## 2023-03-18 NOTE — Telephone Encounter (Signed)
Would you prefer pt to follow up with you or Dr. Caryl Bis since he started the work up?

## 2023-03-19 ENCOUNTER — Ambulatory Visit
Admission: RE | Admit: 2023-03-19 | Discharge: 2023-03-19 | Disposition: A | Discharge status: Home / Self Care | Payer: Medicare Other | Source: Ambulatory Visit | Attending: Family Medicine | Admitting: Family Medicine

## 2023-03-19 DIAGNOSIS — R1012 Left upper quadrant pain: Secondary | ICD-10-CM | POA: Diagnosis not present

## 2023-03-19 DIAGNOSIS — I700 Atherosclerosis of aorta: Secondary | ICD-10-CM | POA: Diagnosis not present

## 2023-03-19 DIAGNOSIS — R109 Unspecified abdominal pain: Secondary | ICD-10-CM | POA: Diagnosis not present

## 2023-03-19 MED ORDER — IOHEXOL 300 MG/ML  SOLN
100.0000 mL | Freq: Once | INTRAMUSCULAR | Status: AC | PRN
  Administered 2023-03-19: 100 mL via INTRAVENOUS

## 2023-03-21 ENCOUNTER — Encounter: Payer: Self-pay | Admitting: Internal Medicine

## 2023-03-24 ENCOUNTER — Ambulatory Visit (INDEPENDENT_AMBULATORY_CARE_PROVIDER_SITE_OTHER): Payer: Medicare Other | Admitting: Internal Medicine

## 2023-03-24 VITALS — BP 116/80 | HR 60 | Temp 98.3°F | Ht 64.5 in | Wt 117.8 lb

## 2023-03-24 DIAGNOSIS — I700 Atherosclerosis of aorta: Secondary | ICD-10-CM

## 2023-03-24 DIAGNOSIS — R1012 Left upper quadrant pain: Secondary | ICD-10-CM

## 2023-03-24 DIAGNOSIS — N200 Calculus of kidney: Secondary | ICD-10-CM

## 2023-03-24 DIAGNOSIS — F331 Major depressive disorder, recurrent, moderate: Secondary | ICD-10-CM | POA: Diagnosis not present

## 2023-03-24 MED ORDER — TAMSULOSIN HCL 0.4 MG PO CAPS: 0.4000 mg | ORAL_CAPSULE | Freq: Every day | ORAL | 3 refills | Status: DC

## 2023-03-24 MED ORDER — VENLAFAXINE HCL ER 75 MG PO CP24: 75.0000 mg | ORAL_CAPSULE | Freq: Every day | ORAL | 1 refills | Status: DC

## 2023-03-24 MED ORDER — LEVOTHYROXINE SODIUM 100 MCG PO TABS: 100.0000 ug | ORAL_TABLET | Freq: Every day | ORAL | 1 refills | Status: DC

## 2023-03-24 NOTE — Progress Notes (Unsigned)
Subjective:  Patient ID: Alexis Mcdonald, female    DOB: [birth year]  Age: 72 y.o. MRN: [id number]  CC: There were no encounter diagnoses.   HPI Alexis Mcdonald presents for  Chief Complaint  Patient presents with   discuss CT scan results   72 yr old female with a remote history of left nephrolithiasis requiring lithotripsy and redundant bowel ( per GI)  presents for follow up on results of Abd/pelvic  CT ordered by ES and done March 22 to  investigate 3 week history of LUQ pain .   CT scan was notable for  Multiple stones noted in L upper renal pelvis up to 1 cm in size without hydronephrosis , retained stool, and minimal aortoiliac placque   She feels somewhat better ; the pain is no longer 24/7  but now intermittent in the LUQ .   Chronic constipation: managed with vegetarian high fiber diet,  increased water  intake (> 60 ounces daily , acidified with citric acid. )  Moves bowels daily ,  moves bowels daily, but films and prior Colonoscopy reports note retained stool in large amounts and redundant bowel  No acute intra-abdominal or pelvic pathology. 2. Constipation. No bowel obstruction. Normal appendix. 3. Nonobstructing left renal upper pole calculi. No hydronephrosis. 4.  Aortic Atherosclerosis (ICD10-I70.0).   Outpatient Medications Prior to Visit  Medication Sig Dispense Refill   acetaminophen (TYLENOL 8 HOUR ARTHRITIS PAIN) 650 MG CR tablet Take 2 tablets (1,300 mg total) by mouth every 8 (eight) hours as needed for pain. 30 tablet 0   ALPRAZolam (XANAX) 0.25 MG tablet Take 0.5 tablets (0.125 mg total) by mouth at bedtime as needed for sleep. 30 tablet 5   cholecalciferol (VITAMIN D3) 25 MCG (1000 UNIT) tablet Take 1,000 Units by mouth daily.     fish oil-omega-3 fatty acids 1000 MG capsule Take 1 g by mouth daily.      nicotine polacrilex (NICORETTE) 2 MG gum Take 2 mg by mouth as needed for smoking cessation.     psyllium (REGULOID) 0.52 g  capsule Take 0.52 g by mouth daily.     venlafaxine XR (EFFEXOR XR) 37.5 MG 24 hr capsule Take 1 capsule (37.5 mg total) by mouth daily with breakfast. 90 capsule 1   levothyroxine (SYNTHROID) 100 MCG tablet TAKE 1 TABLET BY MOUTH DAILY BEFORE BREAKFAST 90 tablet 1   fexofenadine-pseudoephedrine (ALLEGRA-D) 60-120 MG 12 hr tablet Take 1 tablet by mouth daily as needed (allergies). (Patient not taking: Reported on 03/24/2023)     No facility-administered medications prior to visit.    Review of Systems;  Patient denies headache, fevers, malaise, unintentional weight loss, skin rash, eye pain, sinus congestion and sinus pain, sore throat, dysphagia,  hemoptysis , cough, dyspnea, wheezing, chest pain, palpitations, orthopnea, edema, abdominal pain, nausea, melena, diarrhea, constipation, flank pain, dysuria, hematuria, urinary  Frequency, nocturia, numbness, tingling, seizures,  Focal weakness, Loss of consciousness,  Tremor, insomnia, depression, anxiety, and suicidal ideation.      Objective:  BP 116/80   Pulse 60   Temp 98.3 F (36.8 C) (Oral)   Ht 5' 4.5" (1.638 m)   Wt 117 lb 12.8 oz (53.4 kg)   SpO2 99%   BMI 19.91 kg/m   BP Readings from Last 3 Encounters:  03/24/23 116/80  03/05/23 122/74  03/04/23 134/78    Wt Readings from Last 3 Encounters:  03/24/23 117 lb 12.8 oz (53.4 kg)  03/07/23 116 lb (52.6 kg)  03/05/23 116 lb 9.6 oz (52.9 kg)    Physical Exam  No results found for: "HGBA1C"  Lab Results  Component Value Date   CREATININE 0.72 03/05/2023   CREATININE 0.75 02/17/2023   CREATININE 0.75 09/23/2022    Lab Results  Component Value Date   WBC 3.2 (L) 03/05/2023   HGB 13.4 03/05/2023   HCT 40.3 03/05/2023   PLT 154.0 03/05/2023   GLUCOSE 90 03/05/2023   CHOL 186 02/17/2023   TRIG 94.0 02/17/2023   HDL 90.40 02/17/2023   LDLDIRECT 78.0 02/17/2023   LDLCALC 76 02/17/2023   ALT 11 03/05/2023   AST 19 03/05/2023   NA 137 03/05/2023   K 4.2 03/05/2023    CL 102 03/05/2023   CREATININE 0.72 03/05/2023   BUN 23 03/05/2023   CO2 28 03/05/2023   TSH 1.50 02/17/2023    CT Abdomen Pelvis W Contrast  Result Date: 03/21/2023 CLINICAL DATA:  Left upper quadrant abdominal pain. EXAM: CT ABDOMEN AND PELVIS WITH CONTRAST TECHNIQUE: Multidetector CT imaging of the abdomen and pelvis was performed using the standard protocol following bolus administration of intravenous contrast. RADIATION DOSE REDUCTION: This exam was performed according to the departmental dose-optimization program which includes automated exposure control, adjustment of the mA and/or kV according to patient size and/or use of iterative reconstruction technique. CONTRAST:  110mL OMNIPAQUE IOHEXOL 300 MG/ML  SOLN COMPARISON:  CT abdomen pelvis dated 11/12/2016. FINDINGS: Lower chest: The visualized lung bases are clear. No intra-abdominal free air or free fluid. Hepatobiliary: No focal liver abnormality is seen. No gallstones, gallbladder wall thickening, or biliary dilatation. Pancreas: Unremarkable. No pancreatic ductal dilatation or surrounding inflammatory changes. Spleen: Normal in size without focal abnormality. Adrenals/Urinary Tract: The adrenal glands are unremarkable. Several nonobstructing left renal upper pole calculi measure up to 10 mm. No hydronephrosis. The right kidney is unremarkable. There is symmetric enhancement and excretion of contrast by both kidneys. The visualized ureters and urinary bladder appear unremarkable. Stomach/Bowel: Large amount of stool throughout the colon. There is no bowel obstruction or active inflammation. The appendix is normal. Vascular/Lymphatic: Mild aortoiliac atherosclerotic disease. The IVC is unremarkable. No portal venous gas. There is no adenopathy. Reproductive: The uterus is not visualized with certainty and may be surgically absent. Evaluation of the pelvic structure however is very limited due to streak artifact caused by right hip  arthroplasty. Other: None Musculoskeletal: Osteopenia with degenerative changes of the spine. Total right hip arthroplasty. No acute osseous pathology. IMPRESSION: 1. No acute intra-abdominal or pelvic pathology. 2. Constipation. No bowel obstruction. Normal appendix. 3. Nonobstructing left renal upper pole calculi. No hydronephrosis. 4.  Aortic Atherosclerosis (ICD10-I70.0). Electronically Signed   By: Anner Crete M.D.   On: 03/21/2023 01:37    Assessment & Plan:  .There are no diagnoses linked to this encounter.   I provided 30 minutes of face-to-face time during this encounter reviewing patient's last visit with me, patient's  most recent visit with cardiology,  nephrology,  and neurology,  recent surgical and non surgical procedures, previous  labs and imaging studies, counseling on currently addressed issues,  and post visit ordering to diagnostics and therapeutics .   Follow-up: No follow-ups on file.   [name], MD

## 2023-03-24 NOTE — Patient Instructions (Addendum)
Urology referral In process    Magnesium citrate : drink the entire bottle and stay home .  If it hasn't worked in 6 hours,  it probably won't, so add dulcolax 10 mg   .  There are 4 categories of laxatives.  They can be combined,  But some should not be used daily .  Bulk  forming laxatives   Ok to use daily  Citrucel, benefiber, metamucil, Fibercon,)  Stool softener (docusate ; there's only one) :  ok to use daily   Stimulant laxatives (Ex Lax,  Correctol,  Senna,  Dulcolax)  :  avoid on a daily basis, not more than 2/weel   Cathartic laxatives ( MOM,  MIralax ,  Mag citrate, Lactulose ) : not more than 2 /week    Ok to take plain  magnesium in capsule form   With or without the stool softener daily

## 2023-03-25 ENCOUNTER — Telehealth: Payer: Self-pay

## 2023-03-25 DIAGNOSIS — N200 Calculus of kidney: Secondary | ICD-10-CM | POA: Insufficient documentation

## 2023-03-25 DIAGNOSIS — I700 Atherosclerosis of aorta: Secondary | ICD-10-CM | POA: Insufficient documentation

## 2023-03-25 NOTE — Telephone Encounter (Signed)
Left message to call back regarding CT abdomen and pelvis results

## 2023-03-25 NOTE — Telephone Encounter (Signed)
Pt returned Elizabeth CMA call. Transferred. 

## 2023-03-25 NOTE — Assessment & Plan Note (Signed)
She reports improved mood since resuming Effexor and is currently taking 75 mg daily.  Refills given. Advised that she can increse dose to 150 mg daily if needed and notify me if so,

## 2023-03-25 NOTE — Assessment & Plan Note (Addendum)
Minimal,  by recent CT.  Calcium score was zero on recent cardiac CT and lipid profile is excellent (HDL > LDL)  No treatment advise d  Lab Results  Component Value Date   CHOL 186 02/17/2023   HDL 90.40 02/17/2023   LDLCALC 76 02/17/2023   LDLDIRECT 78.0 02/17/2023   TRIG 94.0 02/17/2023   CHOLHDL 2 02/17/2023

## 2023-03-25 NOTE — Telephone Encounter (Signed)
-----   Message from Leone Haven, MD sent at 03/24/2023 10:31 AM EDT ----- Please let the patient know that her CT abdomen and pelvis did reveal stool in the colon consistent with constipation.  This potentially could be causing some of her discomfort though the patient noted no issues with constipation during my visit with her.  It appears she is seeing Dr.  Derrel Nip today for follow-up on this and it could be worthwhile discussing possible treatments for potential constipation.  There were some kidney stones on the CT scan though they were not obstructing and are unlikely to be causing her symptoms.  There is also some aortic atherosclerosis and she can discuss that further with Dr. Derrel Nip.

## 2023-03-25 NOTE — Assessment & Plan Note (Signed)
She has several stones in left kidney,  one measuring 1 cm.  Non obstructing.  Following the diet.  Refer to Urology.  Flomax rx on file if she develops signs of ureteral migration

## 2023-03-25 NOTE — Assessment & Plan Note (Signed)
Pain is likely due to constipation at the turn of the transverse colon, gand not the nonobstructive stone in the left kidney. Reviewed bowel regimen  recommended trial of magnesium citrate followed by daily use of magnesium . And use of stimulant laxative every 2-3 days to reduce colonic burden of stool.   Linzess,  Trulance and Amitiza were discussed but are not covered by her insurance.

## 2023-03-25 NOTE — Telephone Encounter (Signed)
Spoke to Patient.

## 2023-03-31 ENCOUNTER — Encounter: Payer: Self-pay | Admitting: Internal Medicine

## 2023-04-01 NOTE — Telephone Encounter (Signed)
Referral was placed on 03/24/2023.

## 2023-04-02 ENCOUNTER — Telehealth: Payer: Self-pay | Admitting: Internal Medicine

## 2023-04-02 ENCOUNTER — Telehealth: Payer: Self-pay

## 2023-04-02 ENCOUNTER — Other Ambulatory Visit: Payer: Self-pay

## 2023-04-02 DIAGNOSIS — D126 Benign neoplasm of colon, unspecified: Secondary | ICD-10-CM

## 2023-04-02 NOTE — Telephone Encounter (Signed)
Gastroenterology Pre-Procedure Review  Request Date: 10/24/23 Requesting Physician: Dr. Marius Ditch  PATIENT REVIEW QUESTIONS: The patient responded to the following health history questions as indicated:    1. Are you having any GI issues?  Occasional constipation has been taking mad citrate 2. Do you have a personal history of Polyps? yes (last colonoscopy performed with Dr. Marius Ditch 10/21/18 polyps were noted) 3. Do you have a family history of Colon Cancer or Polyps? no 4. Diabetes Mellitus? no 5. Joint replacements in the past 12 months?no 6. Major health problems in the past 3 months?no 7. Any artificial heart valves, MVP, or defibrillator?no    MEDICATIONS & ALLERGIES:    Patient reports the following regarding taking any anticoagulation/antiplatelet therapy:   Plavix, Coumadin, Eliquis, Xarelto, Lovenox, Pradaxa, Brilinta, or Effient? no Aspirin? no  Patient confirms/reports the following medications:  Current Outpatient Medications  Medication Sig Dispense Refill   acetaminophen (TYLENOL 8 HOUR ARTHRITIS PAIN) 650 MG CR tablet Take 2 tablets (1,300 mg total) by mouth every 8 (eight) hours as needed for pain. 30 tablet 0   ALPRAZolam (XANAX) 0.25 MG tablet Take 0.5 tablets (0.125 mg total) by mouth at bedtime as needed for sleep. 30 tablet 5   cholecalciferol (VITAMIN D3) 25 MCG (1000 UNIT) tablet Take 1,000 Units by mouth daily.     fish oil-omega-3 fatty acids 1000 MG capsule Take 1 g by mouth daily.      levothyroxine (SYNTHROID) 100 MCG tablet Take 1 tablet (100 mcg total) by mouth daily before breakfast. 90 tablet 1   nicotine polacrilex (NICORETTE) 2 MG gum Take 2 mg by mouth as needed for smoking cessation.     psyllium (REGULOID) 0.52 g capsule Take 0.52 g by mouth daily.     tamsulosin (FLOMAX) 0.4 MG CAPS capsule Take 1 capsule (0.4 mg total) by mouth daily. 30 capsule 3   venlafaxine XR (EFFEXOR XR) 75 MG 24 hr capsule Take 1 capsule (75 mg total) by mouth daily with  breakfast. 90 capsule 1   No current facility-administered medications for this visit.    Patient confirms/reports the following allergies:  Allergies  Allergen Reactions   Penicillins     Childhood allergy, welts Has patient had a PCN reaction causing immediate rash, facial/tongue/throat swelling, SOB or lightheadedness with hypotension: No Has patient had a PCN reaction causing severe rash involving mucus membranes or skin necrosis: Yes Has patient had a PCN reaction that required hospitalization: No Has patient had a PCN reaction occurring within the last 10 years: No If all of the above answers are "NO", then may proceed with Cephalosporin use.    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   AUTHORIZATION INFORMATION Primary Insurance: 1D#: Group #:  Secondary Insurance: 1D#: Group #:  SCHEDULE INFORMATION: Date: Friday 10/24/23 Time: Location: [place]

## 2023-04-02 NOTE — Telephone Encounter (Signed)
Lft pt vm regarding [facility] calling pt 3x 03/26-03/28 and lft the number for pt to call to [facility] to sch.thanks

## 2023-04-02 NOTE — Telephone Encounter (Signed)
Patient contacted [facility] she thought our office called.  I scheduled her colonoscopy and gave her your message.  Thanks, [place], [place]

## 2023-04-07 DIAGNOSIS — F413 Other mixed anxiety disorders: Secondary | ICD-10-CM | POA: Diagnosis not present

## 2023-04-29 ENCOUNTER — Ambulatory Visit (INDEPENDENT_AMBULATORY_CARE_PROVIDER_SITE_OTHER): Payer: Medicare Other | Admitting: Urology

## 2023-04-29 VITALS — BP 109/68 | HR 69 | Ht 64.5 in | Wt 116.0 lb

## 2023-04-29 DIAGNOSIS — R1012 Left upper quadrant pain: Secondary | ICD-10-CM | POA: Diagnosis not present

## 2023-04-29 DIAGNOSIS — N200 Calculus of kidney: Secondary | ICD-10-CM | POA: Diagnosis not present

## 2023-04-29 DIAGNOSIS — R3129 Other microscopic hematuria: Secondary | ICD-10-CM

## 2023-04-29 LAB — URINALYSIS, COMPLETE
Bilirubin, UA: NEGATIVE
Glucose, UA: NEGATIVE
Ketones, UA: NEGATIVE
Leukocytes,UA: NEGATIVE
Nitrite, UA: NEGATIVE
Protein,UA: NEGATIVE
Specific Gravity, UA: 1.015 (ref 1.005–1.030)
Urobilinogen, Ur: 1 mg/dL (ref 0.2–1.0)
pH, UA: 7.5 (ref 5.0–7.5)

## 2023-04-29 LAB — MICROSCOPIC EXAMINATION

## 2023-04-29 NOTE — Patient Instructions (Signed)

## 2023-04-29 NOTE — Progress Notes (Signed)
I, Amy L Pierron,acting as a scribe for Vanna Scotland, MD.,have documented all relevant documentation on the behalf of Vanna Scotland, MD,as directed by  Vanna Scotland, MD while in the presence of Vanna Scotland, MD.  04/29/2023 4:23 PM   Asencion Partridge Michaella Imai 04/18/1951 [id number]  Referring provider: Sherlene Shams, MD [address] [place],  [place] [address]  Chief Complaint  Patient presents with   Establish Care   Nephrolithiasis    HPI: 72 year-old female presents today for a further evaluation of kidney stones.  She saw her primary care due to having left upper quadrant pain. At that time her urinalysis showed some blood on dip. No microscopic was done. Urine culture was negative. On 03/19/2023, she underwent a CT abdomen pelvis with contrast. It showed a 10 millimeter cluster of left upper pole stones but no hydronephrosis. She was prescribed Flomax.   Her urinalysis today has 3-10 red blood cells per high power field.   She reports learning that her upper quadrant pain was due to constipation. She has a long history of it but didn't usually have that type of pain and was having regular bowel movements. She no longer has the upper quadrant pain.   She reports a personal history of kidney stones and has had a lithotripsy. About 5 or 6 years ago a stone was seen on imaging and was told by the doctor a lithotripsy was not an option because it was too large to move and too close to her kidney. Since starting the Flomax she has felt more tired and had urinary frequency. She is glad to stop taking it. She is a vegetarian is very mindful of eating in a way to prevent stones.   She has a personal history of tobacco usage lasting over 30 years. She quit about 20 years ago.   Results for orders placed or performed in visit on 04/29/23  Microscopic Examination   Urine  Result Value Ref Range   WBC, UA 0-5 0 - 5 /hpf   RBC, Urine 3-10 (A) 0 - 2 /hpf   Epithelial  Cells (non renal) 0-10 0 - 10 /hpf   Crystals Present (A) N/A   Crystal Type Amorphous Sediment N/A   Bacteria, UA Moderate (A) None seen/Few  Urinalysis, Complete  Result Value Ref Range   Specific Gravity, UA 1.015 1.005 - 1.030   pH, UA 7.5 5.0 - 7.5   Color, UA Yellow Yellow   Appearance Ur Clear Clear   Leukocytes,UA Negative Negative   Protein,UA Negative Negative/Trace   Glucose, UA Negative Negative   Ketones, UA Negative Negative   RBC, UA Trace (A) Negative   Bilirubin, UA Negative Negative   Urobilinogen, Ur 1.0 0.2 - 1.0 mg/dL   Nitrite, UA Negative Negative   Microscopic Examination See below:      PMH: Past Medical History:  Diagnosis Date   Borderline osteopenia    prior D[facility] at [facility]    [facility])    Melanoma removed from right hip   Colon polyp 2009   next one due 2012   COVID-19 06/05/2021   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  Polyps 9 mm in size.   COLONOSCOPY WITH PROPOFOL N/A 10/21/2018   Procedure: COLONOSCOPY WITH PROPOFOL;  Surgeon: Earline Mayotte, MD;  Location: [facility] ENDOSCOPY;  Service: Endoscopy;  Laterality: N/A;   DILATION AND CURETTAGE OF UTERUS     JOINT REPLACEMENT  2005   done in [place]   kidney stones removal     MELANOMA EXCISION  06/29/2016   right hip replacement     SHOULDER ARTHROSCOPY WITH OPEN ROTATOR CUFF REPAIR Left 12/01/2018   Procedure: SHOULDER ARTHROSCOPY WITH OPEN ROTATOR CUFF REPAIR;  Surgeon: Christena Flake, MD;  Location:  [facility] ORS;  Service: Orthopedics;  Laterality: Left;    Home Medications:  Allergies as of 04/29/2023       Reactions   Penicillins    Childhood allergy, welts Has patient had a PCN reaction causing immediate rash, facial/tongue/throat swelling, SOB or lightheadedness with hypotension: No Has patient had a PCN reaction causing severe rash involving mucus membranes or skin necrosis: Yes Has patient had a PCN reaction that required hospitalization: No Has patient had a PCN reaction occurring within the last 10 years: No If all of the above answers are "NO", then may proceed with Cephalosporin use.   Butenafine Rash   Morbilliform rash coverin entire body  June 2014   Terbinafine And Related Rash   Morbilliform rash coverin entire body  June 2014        Medication List        Accurate as of April 29, 2023  4:23 PM. If you have any questions, ask your nurse or doctor.          acetaminophen 650 MG CR tablet Commonly known as: Tylenol 8 Hour Arthritis Pain Take 2 tablets (1,300 mg total) by mouth every 8 (eight) hours as needed for pain.   ALPRAZolam 0.25 MG tablet Commonly known as: XANAX Take 0.5 tablets (0.125 mg total) by mouth at bedtime as needed for sleep.   cholecalciferol 25 MCG (1000 UNIT) tablet Commonly known as: VITAMIN D3 Take 1,000 Units by mouth daily.   fish oil-omega-3 fatty acids 1000 MG capsule Take 1 g by mouth daily.   levothyroxine 100 MCG tablet Commonly known as: SYNTHROID Take 1 tablet (100 mcg total) by mouth daily before breakfast.   nicotine polacrilex 2 MG gum Commonly known as: NICORETTE Take 2 mg by mouth as needed for smoking cessation.   psyllium 0.52 g capsule Commonly known as: REGULOID Take 0.52 g by mouth daily.   tamsulosin 0.4 MG Caps capsule Commonly known as: FLOMAX Take 1 capsule (0.4 mg total) by mouth daily.   venlafaxine XR 75 MG 24 hr capsule Commonly known as: Effexor XR Take 1 capsule (75 mg total) by mouth  daily with breakfast.        Allergies:  Allergies  Allergen Reactions   Penicillins     Childhood allergy, welts Has patient had a PCN reaction causing immediate rash, facial/tongue/throat swelling, SOB or lightheadedness with hypotension: No Has patient had a PCN reaction causing severe rash involving mucus membranes or skin necrosis: Yes Has patient had a PCN reaction that required hospitalization: No Has patient had a PCN reaction occurring within the last 10 years: No If all of the above  answers are "NO", then may proceed with Cephalosporin use.    Butenafine Rash    Morbilliform rash coverin entire body  June 2014   Terbinafine And Related Rash    Morbilliform rash coverin entire body  June 2014    Family History: Family History  Problem Relation Age of Onset   Diabetes Mother    Stroke Mother    Hyperlipidemia Mother    Hypertension Mother    Alcohol abuse Father    Diabetes Brother    Alcohol abuse Brother    Hypertension Brother    Alcohol abuse Brother    Heart disease Maternal Uncle    Breast cancer Neg Hx     Social History:  reports that she quit smoking about 22 years ago. Her smoking use included cigarettes. She has a 42.00 pack-year smoking history. She has never used smokeless tobacco. She reports current alcohol use. She reports that she does not use drugs.   Physical Exam: BP 109/68   Pulse 69   Ht 5' 4.5" (1.638 m)   Wt 116 lb (52.6 kg)   BMI 19.60 kg/m   Constitutional:  Alert and oriented, No acute distress. HEENT: [place] AT, moist mucus membranes.  Trachea midline, no masses. Neurologic: Grossly intact, no focal deficits, moving all 4 extremities. Psychiatric: Normal mood and affect.   Pertinent Imaging: EXAM: CT ABDOMEN AND PELVIS WITH CONTRAST   TECHNIQUE: Multidetector CT imaging of the abdomen and pelvis was performed using the standard protocol following bolus administration of intravenous contrast.   RADIATION DOSE REDUCTION:  This exam was performed according to the departmental dose-optimization program which includes automated exposure control, adjustment of the mA and/or kV according to patient size and/or use of iterative reconstruction technique.   CONTRAST:  OMNIPAQUE IOHEXOL 300 MG/ML  SOLN   COMPARISON:  CT abdomen pelvis dated 11/12/2016.   FINDINGS: Lower chest: The visualized lung bases are clear.   No intra-abdominal free air or free fluid.   Hepatobiliary: No focal liver abnormality is seen. No gallstones, gallbladder wall thickening, or biliary dilatation.   Pancreas: Unremarkable. No pancreatic ductal dilatation or surrounding inflammatory changes.   Spleen: Normal in size without focal abnormality.   Adrenals/Urinary Tract: The adrenal glands are unremarkable. Several nonobstructing left renal upper pole calculi measure up to 10 mm. No hydronephrosis. The right kidney is unremarkable. There is symmetric enhancement and excretion of contrast by both kidneys. The visualized ureters and urinary bladder appear unremarkable.   Stomach/Bowel: Large amount of stool throughout the colon. There is no bowel obstruction or active inflammation. The appendix is normal.   Vascular/Lymphatic: Mild aortoiliac atherosclerotic disease. The IVC is unremarkable. No portal venous gas. There is no adenopathy.   Reproductive: The uterus is not visualized with certainty and may be surgically absent. Evaluation of the pelvic structure however is very limited due to streak artifact caused by right hip arthroplasty.   Other: None   Musculoskeletal: Osteopenia with degenerative changes of the spine. Total right hip arthroplasty. No acute osseous pathology.   IMPRESSION: 1. No acute intra-abdominal or pelvic pathology. 2. Constipation. No bowel obstruction. Normal appendix. 3. Nonobstructing left renal upper pole calculi. No hydronephrosis. 4.  Aortic Atherosclerosis (ICD10-I70.0).      Electronically Signed   By: Elgie Collard M.D.   On: 03/21/2023 01:37 CLINICAL DATA:  Episodes of gross hematuria and passing clots last night, history of kidney stones and lithotripsy, history melanoma RIGHT hip region   EXAM: CT ABDOMEN AND PELVIS  WITHOUT CONTRAST   TECHNIQUE: Multidetector CT imaging of the abdomen and pelvis was performed following the standard protocol without IV contrast. Sagittal and coronal MPR images reconstructed from axial data set. Oral contrast was not administered.   COMPARISON:  04/26/2010   FINDINGS: Lower chest: Lung bases clear   Hepatobiliary: Gallbladder and liver normal appearance.   Pancreas: Normal appearance   Spleen: Normal appearance   Adrenals/Urinary Tract: Adrenal glands normal appearance. No renal mass or hydronephrosis. Small nonobstructing calculi upper pole LEFT kidney. Distal ureters and bladder obscured by beam hardening artifacts in pelvis. No proximal ureteral dilatation.   Stomach/Bowel: Scattered stool throughout colon. Stomach and bowel loops otherwise normal appearance. Appendix not visualized.   Vascular/Lymphatic: Aorta normal caliber. Scattered pelvic phleboliths. No adenopathy.   Reproductive: Uterus grossly normal in size though partially obscured by beam hardening artifacts. Normal size of LEFT ovary. RIGHT ovary not visualized.   Other: No free air or free fluid.  No hernia.   Musculoskeletal: Beam hardening artifacts from RIGHT hip prosthesis. Degenerative disc and facet disease changes lumbar spine. Osseous demineralization.   IMPRESSION: Nonobstructing LEFT renal calculi.   No other definite urinary tract abnormalities identified though the history ureters and bladder are obscured by beam hardening artifacts from a RIGHT hip prosthesis.    Electronically Signed   By: Ulyses Southward M.D.   On: 11/12/2016 11:35  Personally reviewed and compared both scans and agree with radiologic  interpretation.    Assessment & Plan:    Left upper quadrant pain   - Unlikely related to her stone.   - Pain has resolved and was due to constipation.  2. Left kidney stones   - Stable, unchanged from a prior scan 2017. Same number and size stones without obstruction. These are likely incidental and unrelated to her symptoms.  - Given the overall stability, would not recommend any intervention. She is in agreement with this. Encouraged she continue with the stone prevention diet.  3. Microscopic hematuria  - She has 3-10 red blood cells per high power field today.   - Would recommend cystoscopy to evaluate her bladder to rule out any other underlying gene pathology.  - She is ready to schedule to have this done.  No follow-ups on file.   [facility] [address] [place], [place] [address] [phone]

## 2023-04-30 ENCOUNTER — Ambulatory Visit: Payer: Medicare Other | Admitting: Internal Medicine

## 2023-05-29 DIAGNOSIS — F413 Other mixed anxiety disorders: Secondary | ICD-10-CM | POA: Diagnosis not present

## 2023-06-03 ENCOUNTER — Ambulatory Visit (INDEPENDENT_AMBULATORY_CARE_PROVIDER_SITE_OTHER): Payer: Medicare Other | Admitting: Urology

## 2023-06-03 VITALS — BP 128/73 | HR 59 | Ht 64.5 in | Wt 116.0 lb

## 2023-06-03 DIAGNOSIS — N200 Calculus of kidney: Secondary | ICD-10-CM

## 2023-06-03 DIAGNOSIS — N362 Urethral caruncle: Secondary | ICD-10-CM

## 2023-06-03 DIAGNOSIS — R3129 Other microscopic hematuria: Secondary | ICD-10-CM

## 2023-06-03 LAB — MICROSCOPIC EXAMINATION

## 2023-06-03 LAB — URINALYSIS, COMPLETE
Bilirubin, UA: NEGATIVE
Glucose, UA: NEGATIVE
Ketones, UA: NEGATIVE
Leukocytes,UA: NEGATIVE
Nitrite, UA: NEGATIVE
Protein,UA: NEGATIVE
Specific Gravity, UA: 1.015 (ref 1.005–1.030)
Urobilinogen, Ur: 0.2 mg/dL (ref 0.2–1.0)
pH, UA: 5.5 (ref 5.0–7.5)

## 2023-06-03 NOTE — Patient Instructions (Signed)
A urethral caruncle is a benign outgrowth at the urethral meatus (urethral opening). They occur most commonly in postmenopausal women. Urethral caruncles occur when the outermost part of the urethra everts or turns out. When the mucosa is circumferentially everted, meaning the growth encompasses the entire diameter of the urethra, rather than just a segment of the urethra, the lesion is a urethral prolapse.  Urethral caruncles are often asymptomatic and found on pelvic exam.  Symptoms may include pain, light bleeding, painful urination, or impeded flow of urine. Women may also notice a bump at the urethral meatus.  In asymptomatic patients, treatment is often not necessary. For symptomatic patients, treatment includes vaginal estrogen cream, anti-inflammatory medications such as Motrin, and warm sitz baths.  Surgical excision is recommended for larger, symptomatic caruncles that do not respond to more conservative treatment.  

## 2023-06-03 NOTE — Progress Notes (Signed)
   06/03/23  CC:  Chief Complaint  Patient presents with   Cysto    HPI: 72 year old female with microscopic hematuria who presents today for cystoscopy.  She had a CT abdomen pelvis with contrast without delays on 03/21/2023.  This was unremarkable without GU pathology.  She does have several stable nonobstructing left-sided kidney stones.  Blood pressure 128/73, pulse (!) 59, height 5' 4.5" (1.638 m), weight 116 lb (52.6 kg). NED. A&Ox3.   No respiratory distress   Abd soft, NT, ND Normal external genitalia with patent urethral meatus.  She does have some fullness and very subtle erythema along the inferior margin of her urethral meatus, either small caruncle or some very mild prolapse.  She reports that she is asymptomatic.  Cystoscopy Procedure Note  Patient identification was confirmed, informed consent was obtained, and patient was prepped using Betadine solution.  Lidocaine jelly was administered per urethral meatus.    Procedure: - Flexible cystoscope introduced, without any difficulty.   - Thorough search of the bladder revealed:    normal urothelium    no stones    no ulcers     no tumors    no urethral polyps    no trabeculation  - Ureteral orifices were normal in position and appearance.  Post-Procedure: - Patient tolerated the procedure well  Assessment/ Plan:  1. Nephrolithiasis Stable nonobstructing stones  Plan for KUB and x-ray to ensure stability - Urinalysis, Complete  2. Microscopic hematuria Cystoscopy today was unremarkable  May be related to small caruncle  Will deferred additional delayed imaging in light of her recent contrasted CT scan  3. 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

## 2023-06-03 NOTE — Addendum Note (Signed)
Addended by: Consuella Lose on: 06/03/2023 03:54 PM   Modules accepted: Orders

## 2023-06-04 ENCOUNTER — Encounter: Payer: Self-pay | Admitting: Urology

## 2023-06-07 ENCOUNTER — Ambulatory Visit: Admit: 2023-06-07 | Discharge status: Against Medical Advice | Payer: Medicare Other

## 2023-06-08 ENCOUNTER — Ambulatory Visit: Payer: Medicare Other

## 2023-06-12 DIAGNOSIS — C4441 Basal cell carcinoma of skin of scalp and neck: Secondary | ICD-10-CM | POA: Diagnosis not present

## 2023-06-12 DIAGNOSIS — Z86006 Personal history of melanoma in-situ: Secondary | ICD-10-CM | POA: Diagnosis not present

## 2023-06-12 DIAGNOSIS — D485 Neoplasm of uncertain behavior of skin: Secondary | ICD-10-CM | POA: Diagnosis not present

## 2023-06-12 DIAGNOSIS — L570 Actinic keratosis: Secondary | ICD-10-CM | POA: Diagnosis not present

## 2023-06-12 DIAGNOSIS — D2261 Melanocytic nevi of right upper limb, including shoulder: Secondary | ICD-10-CM | POA: Diagnosis not present

## 2023-06-12 DIAGNOSIS — D225 Melanocytic nevi of trunk: Secondary | ICD-10-CM | POA: Diagnosis not present

## 2023-06-12 DIAGNOSIS — Z872 Personal history of diseases of the skin and subcutaneous tissue: Secondary | ICD-10-CM | POA: Diagnosis not present

## 2023-06-12 DIAGNOSIS — Z85820 Personal history of malignant melanoma of skin: Secondary | ICD-10-CM | POA: Diagnosis not present

## 2023-06-12 DIAGNOSIS — D2272 Melanocytic nevi of left lower limb, including hip: Secondary | ICD-10-CM | POA: Diagnosis not present

## 2023-06-12 DIAGNOSIS — D2262 Melanocytic nevi of left upper limb, including shoulder: Secondary | ICD-10-CM | POA: Diagnosis not present

## 2023-06-12 DIAGNOSIS — Z85828 Personal history of other malignant neoplasm of skin: Secondary | ICD-10-CM | POA: Diagnosis not present

## 2023-06-19 ENCOUNTER — Other Ambulatory Visit: Payer: Self-pay | Admitting: Internal Medicine

## 2023-07-30 ENCOUNTER — Encounter (INDEPENDENT_AMBULATORY_CARE_PROVIDER_SITE_OTHER): Payer: Self-pay

## 2023-07-31 DIAGNOSIS — F413 Other mixed anxiety disorders: Secondary | ICD-10-CM | POA: Diagnosis not present

## 2023-08-08 DIAGNOSIS — C4441 Basal cell carcinoma of skin of scalp and neck: Secondary | ICD-10-CM | POA: Diagnosis not present

## 2023-08-08 DIAGNOSIS — D234 Other benign neoplasm of skin of scalp and neck: Secondary | ICD-10-CM | POA: Diagnosis not present

## 2023-08-13 ENCOUNTER — Other Ambulatory Visit: Payer: Self-pay | Admitting: Internal Medicine

## 2023-08-20 ENCOUNTER — Ambulatory Visit (INDEPENDENT_AMBULATORY_CARE_PROVIDER_SITE_OTHER): Payer: Medicare Other | Admitting: Internal Medicine

## 2023-08-20 ENCOUNTER — Encounter: Payer: Self-pay | Admitting: Internal Medicine

## 2023-08-20 VITALS — BP 118/70 | HR 60 | Temp 98.4°F | Ht 64.5 in | Wt 114.6 lb

## 2023-08-20 DIAGNOSIS — I700 Atherosclerosis of aorta: Secondary | ICD-10-CM

## 2023-08-20 DIAGNOSIS — D696 Thrombocytopenia, unspecified: Secondary | ICD-10-CM

## 2023-08-20 DIAGNOSIS — E890 Postprocedural hypothyroidism: Secondary | ICD-10-CM

## 2023-08-20 DIAGNOSIS — Z1211 Encounter for screening for malignant neoplasm of colon: Secondary | ICD-10-CM | POA: Diagnosis not present

## 2023-08-20 MED ORDER — LEVOTHYROXINE SODIUM 100 MCG PO TABS: 100.0000 ug | ORAL_TABLET | Freq: Every day | ORAL | 1 refills | Status: DC

## 2023-08-20 MED ORDER — PREDNISONE 10 MG PO TABS: ORAL_TABLET | ORAL | 0 refills | Status: DC

## 2023-08-20 MED ORDER — AZITHROMYCIN 500 MG PO TABS
500.0000 mg | ORAL_TABLET | Freq: Every day | ORAL | 0 refills | Status: DC
Start: 2023-08-20 — End: 2023-10-24

## 2023-08-20 NOTE — Assessment & Plan Note (Signed)
Minimal,  by recent CT.  Calcium score was zero on recent cardiac CT and lipid profile is excellent (HDL > LDL)  No treatment advise d  Lab Results  Component Value Date   CHOL 186 02/17/2023   HDL 90.40 02/17/2023   LDLCALC 76 02/17/2023   LDLDIRECT 78.0 02/17/2023   TRIG 94.0 02/17/2023   CHOLHDL 2 02/17/2023

## 2023-08-20 NOTE — Progress Notes (Unsigned)
Subjective:  Patient ID: Alexis Mcdonald, female    DOB: April 26, 1951  Age: 72 y.o. MRN: [id number]  CC: {There were no encounter diagnoses. (Refresh or delete this SmartLink)}   HPI Alexis Mcdonald presents for  Chief Complaint  Patient presents with   Medical Management of Chronic Issues    6 month follow up    1) REVIEWED CYSTOSCOPY  2) RECURRENT CONSTIPATION  ALTERNATES WITH SOFT STOOLS . USES METAMUCIL DAILY  IBS   3) USE OF COLLAGEN QUESTIONED.  USE OF MAGNESIUM AND THYROID  Outpatient Medications Prior to Visit  Medication Sig Dispense Refill   acetaminophen (TYLENOL 8 HOUR ARTHRITIS PAIN) 650 MG CR tablet Take 2 tablets (1,300 mg total) by mouth every 8 (eight) hours as needed for pain. 30 tablet 0   ALPRAZolam (XANAX) 0.25 MG tablet Take 0.5 tablets (0.125 mg total) by mouth at bedtime as needed for sleep. 30 tablet 5   cholecalciferol (VITAMIN D3) 25 MCG (1000 UNIT) tablet Take 1,000 Units by mouth daily.     fish oil-omega-3 fatty acids 1000 MG capsule Take 1 g by mouth daily.      levothyroxine (SYNTHROID) 100 MCG tablet Take 1 tablet (100 mcg total) by mouth daily before breakfast. 90 tablet 1   nicotine polacrilex (NICORETTE) 2 MG gum Take 2 mg by mouth as needed for smoking cessation.     psyllium (REGULOID) 0.52 g capsule Take 0.52 g by mouth daily.     tamsulosin (FLOMAX) 0.4 MG CAPS capsule TAKE 1 CAPSULE BY MOUTH DAILY 90 capsule 1   No facility-administered medications prior to visit.    Review of Systems;  Patient denies headache, fevers, malaise, unintentional weight loss, skin rash, eye pain, sinus congestion and sinus pain, sore throat, dysphagia,  hemoptysis , cough, dyspnea, wheezing, chest pain, palpitations, orthopnea, edema, abdominal pain, nausea, melena, diarrhea, constipation, flank pain, dysuria, hematuria, urinary  Frequency, nocturia, numbness, tingling, seizures,  Focal weakness, Loss of consciousness,  Tremor, insomnia,  depression, anxiety, and suicidal ideation.      Objective:  There were no vitals taken for this visit.  BP Readings from Last 3 Encounters:  06/03/23 128/73  04/29/23 109/68  03/24/23 116/80    Wt Readings from Last 3 Encounters:  06/03/23 116 lb (52.6 kg)  04/29/23 116 lb (52.6 kg)  03/24/23 117 lb 12.8 oz (53.4 kg)    Physical Exam  No results found for: "HGBA1C"  Lab Results  Component Value Date   CREATININE 0.72 03/05/2023   CREATININE 0.75 02/17/2023   CREATININE 0.75 09/23/2022    Lab Results  Component Value Date   WBC 3.2 (L) 03/05/2023   HGB 13.4 03/05/2023   HCT 40.3 03/05/2023   PLT 154.0 03/05/2023   GLUCOSE 90 03/05/2023   CHOL 186 02/17/2023   TRIG 94.0 02/17/2023   HDL 90.40 02/17/2023   LDLDIRECT 78.0 02/17/2023   LDLCALC 76 02/17/2023   ALT 11 03/05/2023   AST 19 03/05/2023   NA 137 03/05/2023   K 4.2 03/05/2023   CL 102 03/05/2023   CREATININE 0.72 03/05/2023   BUN 23 03/05/2023   CO2 28 03/05/2023   TSH 1.50 02/17/2023    CT Abdomen Pelvis W Contrast  Result Date: 03/21/2023 CLINICAL DATA:  Left upper quadrant abdominal pain. EXAM: CT ABDOMEN AND PELVIS WITH CONTRAST TECHNIQUE: Multidetector CT imaging of the abdomen and pelvis was performed using the standard protocol following bolus administration of intravenous contrast. RADIATION DOSE REDUCTION: This  exam was performed according to the departmental dose-optimization program which includes automated exposure control, adjustment of the mA and/or kV according to patient size and/or use of iterative reconstruction technique. CONTRAST:  OMNIPAQUE IOHEXOL 300 MG/ML  SOLN COMPARISON:  CT abdomen pelvis dated 11/12/2016. FINDINGS: Lower chest: The visualized lung bases are clear. No intra-abdominal free air or free fluid. Hepatobiliary: No focal liver abnormality is seen. No gallstones, gallbladder wall thickening, or biliary dilatation. Pancreas: Unremarkable. No pancreatic ductal  dilatation or surrounding inflammatory changes. Spleen: Normal in size without focal abnormality. Adrenals/Urinary Tract: The adrenal glands are unremarkable. Several nonobstructing left renal upper pole calculi measure up to 10 mm. No hydronephrosis. The right kidney is unremarkable. There is symmetric enhancement and excretion of contrast by both kidneys. The visualized ureters and urinary bladder appear unremarkable. Stomach/Bowel: Large amount of stool throughout the colon. There is no bowel obstruction or active inflammation. The appendix is normal. Vascular/Lymphatic: Mild aortoiliac atherosclerotic disease. The IVC is unremarkable. No portal venous gas. There is no adenopathy. Reproductive: The uterus is not visualized with certainty and may be surgically absent. Evaluation of the pelvic structure however is very limited due to streak artifact caused by right hip arthroplasty. Other: None Musculoskeletal: Osteopenia with degenerative changes of the spine. Total right hip arthroplasty. No acute osseous pathology. IMPRESSION: 1. No acute intra-abdominal or pelvic pathology. 2. Constipation. No bowel obstruction. Normal appendix. 3. Nonobstructing left renal upper pole calculi. No hydronephrosis. 4.  Aortic Atherosclerosis (ICD10-I70.0). Electronically Signed   By: Elgie Collard M.D.   On: 03/21/2023 01:37    Assessment & Plan:  .There are no diagnoses linked to this encounter.   I provided 30 minutes of face-to-face time during this encounter reviewing patient's last visit with me, patient's  most recent visit with cardiology,  nephrology,  and neurology,  recent surgical and non surgical procedures, previous  labs and imaging studies, counseling on currently addressed issues,  and post visit ordering to diagnostics and therapeutics .   Follow-up: No follow-ups on file.   Sherlene Shams, MD

## 2023-08-20 NOTE — Patient Instructions (Addendum)
Covid reinfection is INEVITABLE WITH TRAVEL BY public transportation  YOU DO NOT  NEED TO TAKE PAXLOVID OR MOLNUPIRAVIR.  THEY ARE NOT EFFECTIVE , NOT FREE ANYMORE AND HAVE POTENTIALLY SERIOUS SIDE EFFECTS   IF YOU TEST POSITIVE,  TAKE COUGH SUPPRESSION,  PREDNISONE TAPER AND ADD AZITHROMYCIN IF YOU DEVELOP A PRODUCTIVE YUKKY COUGH OR SINUS [place] PAIN

## 2023-08-21 NOTE — Assessment & Plan Note (Signed)
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   Rechecking today  Lab Results  Component Value Date   WBC 3.2 (L) 03/05/2023   HGB 13.4 03/05/2023   HCT 40.3 03/05/2023   MCV 94.6 03/05/2023   PLT 154.0 03/05/2023

## 2023-08-21 NOTE — Assessment & Plan Note (Signed)
TSH is at goal on 100 mcg daily at last visit. Repeat level is due   Lab Results  Component Value Date   TSH 1.50 02/17/2023

## 2023-08-30 IMAGING — MG MM DIGITAL SCREENING BILAT W/ TOMO AND CAD
8 series · 9 of 24 positions shown · non-contrast
Comparison: Previous exam(s).

CLINICAL DATA: Screening.

EXAM:
DIGITAL SCREENING BILATERAL MAMMOGRAM WITH TOMOSYNTHESIS AND CAD
TECHNIQUE: Bilateral screening digital craniocaudal and mediolateral oblique
mammograms were obtained. Bilateral screening digital breast
tomosynthesis was performed. The images were evaluated with
computer-aided detection.

[L CC synth-2D]
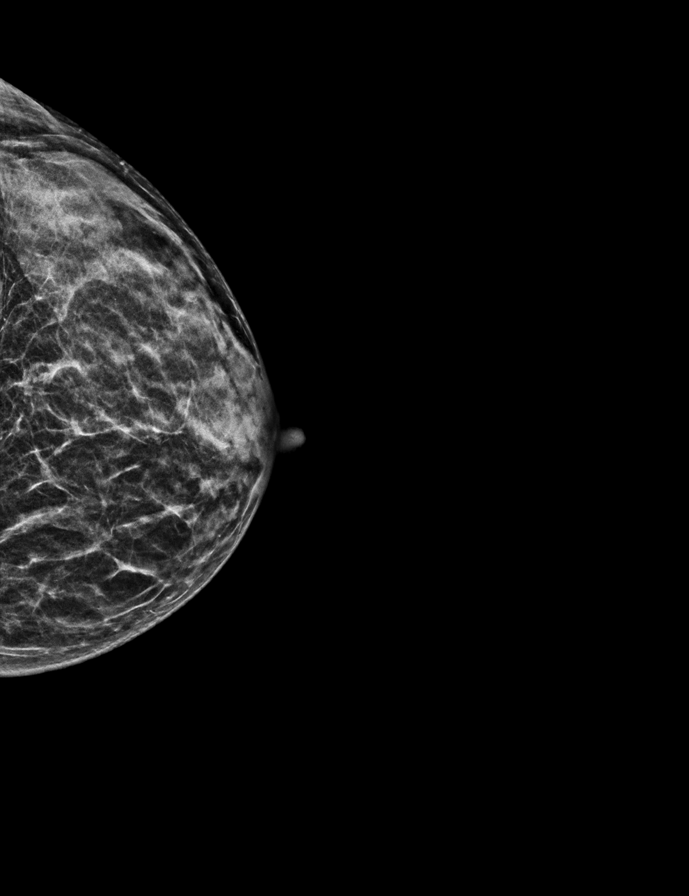

[L MLO synth-2D]
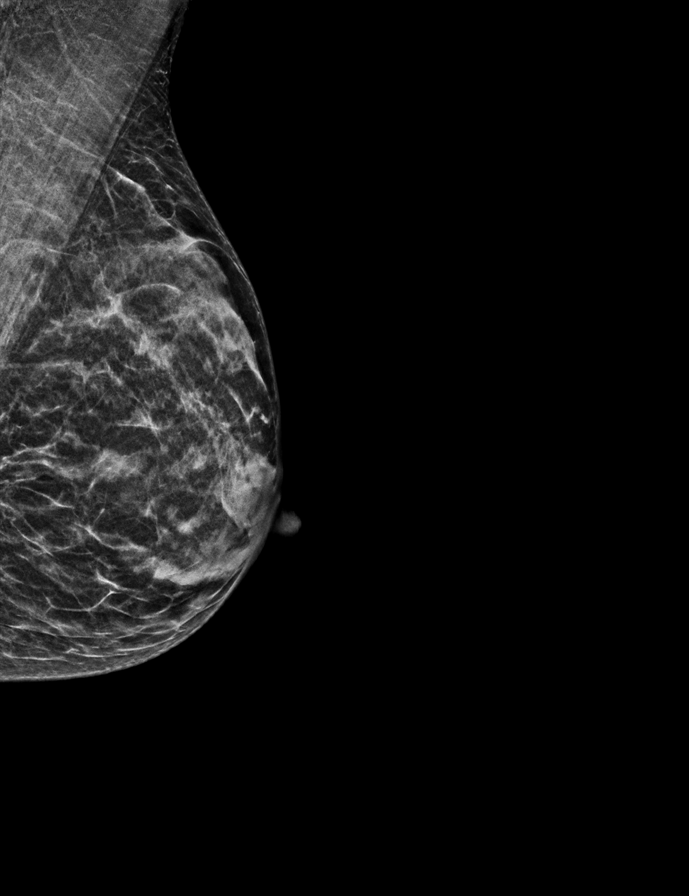

[R MLO synth-2D]
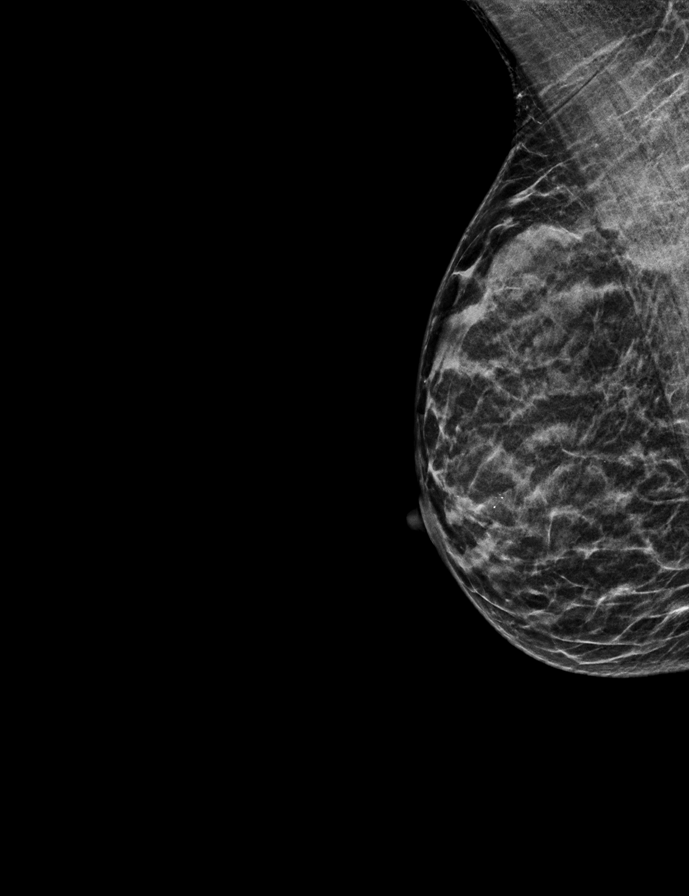

[R CC synth-2D]
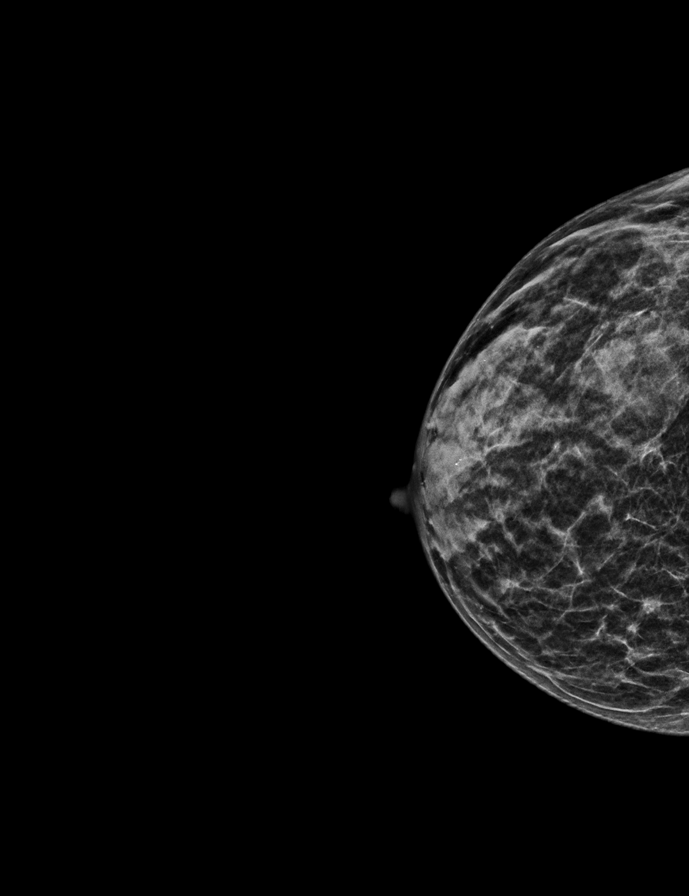

[R MLO tomo · 2 of 30 frames shown]
[frame 10/30]
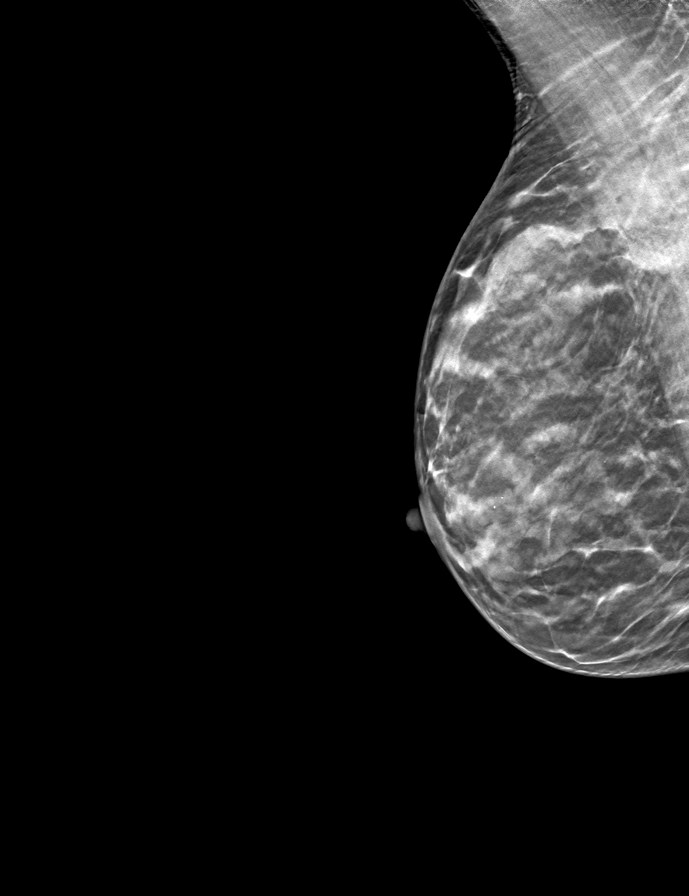
[frame 15/30]
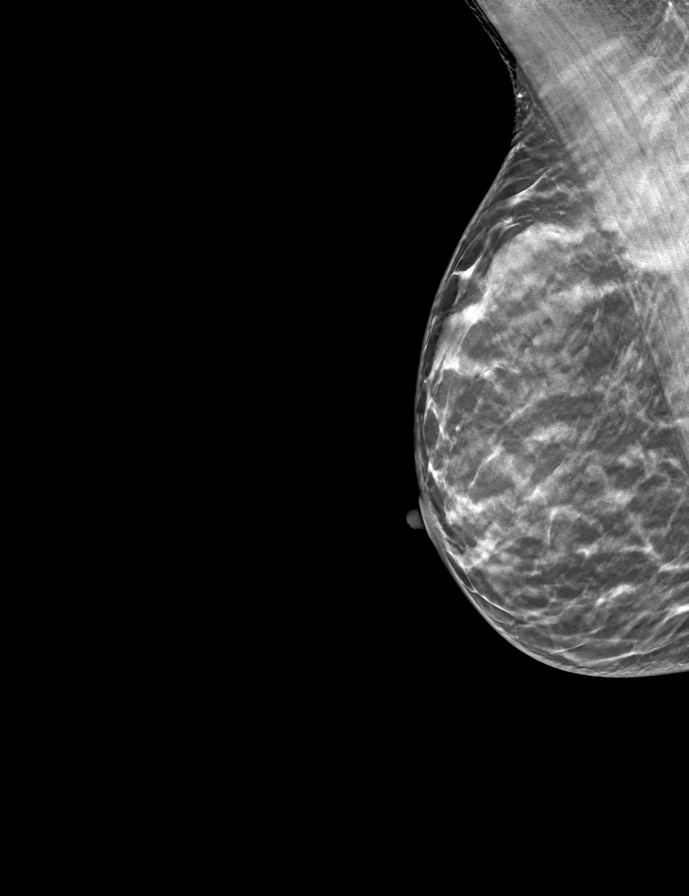

[L CC tomo · tomo slice 17/33.0]
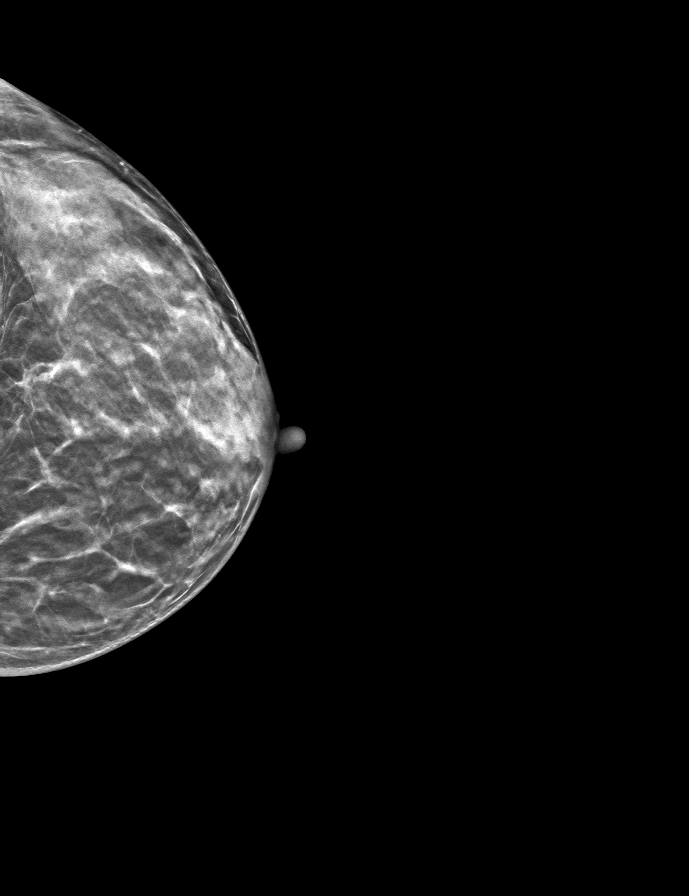

[R CC tomo · tomo slice 17/32.0]
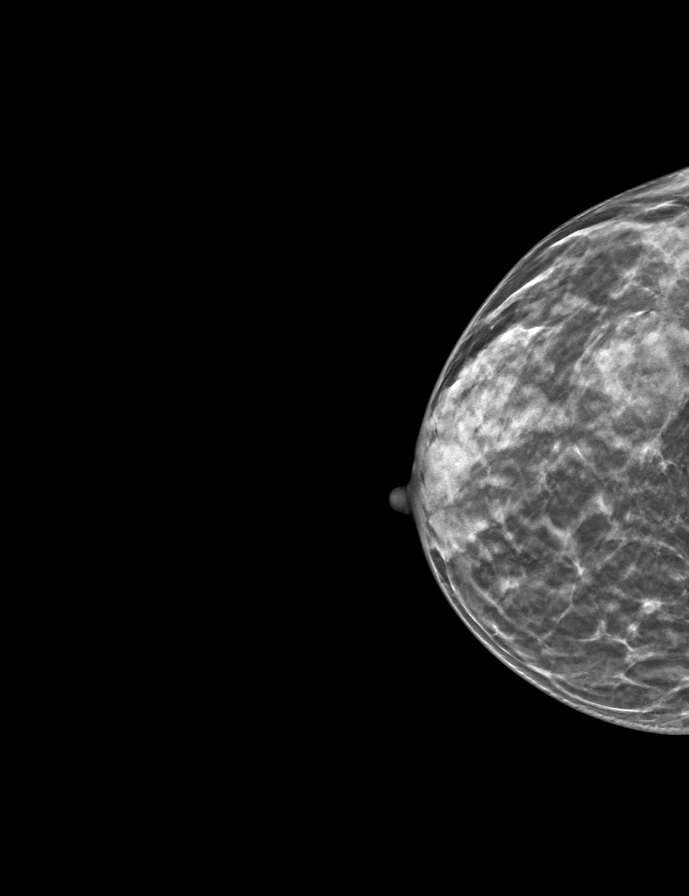

[L MLO tomo · tomo slice 17/32.0]
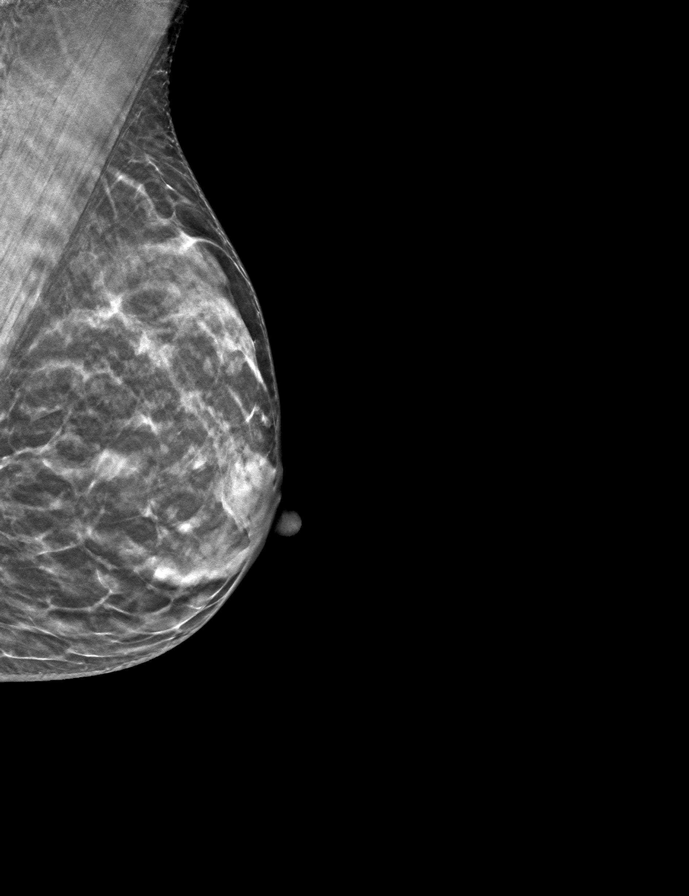

[9 of 24 positions shown; findings below may reference images not displayed]

ACR Breast Density Category c: The breast tissue is heterogeneously
dense, which may obscure small masses.
FINDINGS: There are no findings suspicious for malignancy.
IMPRESSION: No mammographic evidence of malignancy. A result letter of this
screening mammogram will be mailed directly to the patient.

RECOMMENDATION:
Screening mammogram in one year. (Code:Q3-W-BC3)

BI-RADS CATEGORY  1: Negative.

## 2023-09-02 DIAGNOSIS — H43813 Vitreous degeneration, bilateral: Secondary | ICD-10-CM | POA: Diagnosis not present

## 2023-09-02 DIAGNOSIS — H2513 Age-related nuclear cataract, bilateral: Secondary | ICD-10-CM | POA: Diagnosis not present

## 2023-09-02 DIAGNOSIS — H04123 Dry eye syndrome of bilateral lacrimal glands: Secondary | ICD-10-CM | POA: Diagnosis not present

## 2023-09-19 DIAGNOSIS — Z23 Encounter for immunization: Secondary | ICD-10-CM | POA: Diagnosis not present

## 2023-10-01 ENCOUNTER — Inpatient Hospital Stay: Admission: RE | Admit: 2023-10-01 | Payer: Medicare Other | Source: Ambulatory Visit

## 2023-10-02 DIAGNOSIS — F413 Other mixed anxiety disorders: Secondary | ICD-10-CM | POA: Diagnosis not present

## 2023-10-06 ENCOUNTER — Ambulatory Visit
Admission: RE | Admit: 2023-10-06 | Discharge: 2023-10-06 | Disposition: A | Discharge status: Home / Self Care | Payer: Medicare Other | Source: Ambulatory Visit | Attending: Student in an Organized Health Care Education/Training Program | Admitting: Student in an Organized Health Care Education/Training Program

## 2023-10-06 ENCOUNTER — Encounter: Payer: Self-pay | Admitting: Urology

## 2023-10-06 DIAGNOSIS — R911 Solitary pulmonary nodule: Secondary | ICD-10-CM | POA: Diagnosis not present

## 2023-10-06 DIAGNOSIS — Z9189 Other specified personal risk factors, not elsewhere classified: Secondary | ICD-10-CM | POA: Diagnosis not present

## 2023-10-06 DIAGNOSIS — R918 Other nonspecific abnormal finding of lung field: Secondary | ICD-10-CM | POA: Diagnosis not present

## 2023-10-13 ENCOUNTER — Telehealth: Payer: Self-pay

## 2023-10-13 ENCOUNTER — Other Ambulatory Visit: Payer: Self-pay

## 2023-10-13 MED ORDER — NA SULFATE-K SULFATE-MG SULF 17.5-3.13-1.6 GM/177ML PO SOLN: 1.0000 | Freq: Once | ORAL | 0 refills | Status: AC

## 2023-10-13 NOTE — Telephone Encounter (Signed)
Per pt phone call would like prescription sent for her prep.   [phone]

## 2023-10-14 ENCOUNTER — Ambulatory Visit: Payer: Medicare Other | Admitting: Student in an Organized Health Care Education/Training Program

## 2023-10-23 ENCOUNTER — Encounter: Payer: Self-pay | Admitting: Gastroenterology

## 2023-10-24 ENCOUNTER — Ambulatory Visit: Payer: Medicare Other | Admitting: Certified Registered"

## 2023-10-24 ENCOUNTER — Other Ambulatory Visit: Payer: Self-pay

## 2023-10-24 ENCOUNTER — Ambulatory Visit
Admission: RE | Admit: 2023-10-24 | Discharge: 2023-10-24 | Disposition: A | Discharge status: Home / Self Care | Payer: Medicare Other | Attending: Gastroenterology | Admitting: Gastroenterology

## 2023-10-24 ENCOUNTER — Encounter
Admission: RE | Disposition: A | Discharge status: Home / Self Care | Payer: Self-pay | Source: Home / Self Care | Attending: Gastroenterology

## 2023-10-24 ENCOUNTER — Encounter: Payer: Self-pay | Admitting: Gastroenterology

## 2023-10-24 DIAGNOSIS — Z87891 Personal history of nicotine dependence: Secondary | ICD-10-CM | POA: Insufficient documentation

## 2023-10-24 DIAGNOSIS — Z860101 Personal history of adenomatous and serrated colon polyps: Secondary | ICD-10-CM | POA: Diagnosis not present

## 2023-10-24 DIAGNOSIS — K5730 Diverticulosis of large intestine without perforation or abscess without bleeding: Secondary | ICD-10-CM | POA: Insufficient documentation

## 2023-10-24 DIAGNOSIS — Z1211 Encounter for screening for malignant neoplasm of colon: Secondary | ICD-10-CM | POA: Insufficient documentation

## 2023-10-24 DIAGNOSIS — D126 Benign neoplasm of colon, unspecified: Secondary | ICD-10-CM

## 2023-10-24 HISTORY — PX: COLONOSCOPY WITH PROPOFOL: SHX5780

## 2023-10-24 SURGERY — COLONOSCOPY WITH PROPOFOL
Anesthesia: General

## 2023-10-24 MED ORDER — LIDOCAINE HCL (PF) 2 % IJ SOLN
INTRAMUSCULAR | Status: AC
  Filled 2023-10-24: qty 5

## 2023-10-24 MED ORDER — PROPOFOL 1000 MG/100ML IV EMUL
INTRAVENOUS | Status: AC
Start: 2023-10-24 — End: ?
  Filled 2023-10-24: qty 100

## 2023-10-24 MED ORDER — LIDOCAINE HCL (PF) 2 % IJ SOLN
INTRAMUSCULAR | Status: DC | PRN
  Administered 2023-10-24: 40 mg via INTRADERMAL

## 2023-10-24 MED ORDER — PROPOFOL 10 MG/ML IV BOLUS
INTRAVENOUS | Status: DC | PRN
  Administered 2023-10-24 (×2): 30 mg via INTRAVENOUS
  Administered 2023-10-24: 20 mg via INTRAVENOUS
  Administered 2023-10-24: 50 mg via INTRAVENOUS
  Administered 2023-10-24: 20 mg via INTRAVENOUS
  Administered 2023-10-24: 30 mg via INTRAVENOUS
  Administered 2023-10-24: 20 mg via INTRAVENOUS

## 2023-10-24 MED ORDER — LIDOCAINE HCL (PF) 2 % IJ SOLN
INTRAMUSCULAR | Status: AC
  Filled 2023-10-24: qty 10

## 2023-10-24 MED ORDER — SODIUM CHLORIDE 0.9 % IV SOLN: INTRAVENOUS | Status: DC

## 2023-10-24 NOTE — Op Note (Signed)
[facility] Gastroenterology Patient Name: Alexis Mcdonald Procedure Date: 10/24/2023 7:19 AM MRN: [id number] Account #: [id number] Date of Birth: [birth year] Admit Type: Outpatient Age: 72 Room: [facility] ROOM 2 Gender: Female Note Status: Finalized Instrument Name: Peds Colonoscope [id number] Procedure:             Colonoscopy Indications:           Surveillance: Personal history of adenomatous polyps                         on last colonoscopy 5 years ago, Last colonoscopy:                         October 2019 Providers:             Toney Reil MD, MD Medicines:             General Anesthesia Complications:         No immediate complications. Estimated blood loss: None. Procedure:             Pre-Anesthesia Assessment:                        - Prior to the procedure, a History and Physical was                         performed, and patient medications and allergies were                         reviewed. The patient is competent. The risks and                         benefits of the procedure and the sedation options and                         risks were discussed with the patient. All questions                         were answered and informed consent was obtained.                         Patient identification and proposed procedure were                         verified by the physician, the nurse, the                         anesthesiologist, the anesthetist and the technician                         in the pre-procedure area in the procedure room in the                         endoscopy suite. Mental Status Examination: alert and                         oriented. Airway Examination: normal oropharyngeal                         airway and  neck mobility. Respiratory Examination:                         clear to auscultation. CV Examination: normal.                         Prophylactic Antibiotics: The patient does not require                          prophylactic antibiotics. Prior Anticoagulants: The                         patient has taken no anticoagulant or antiplatelet                         agents. ASA Grade Assessment: II - A patient with mild                         systemic disease. After reviewing the risks and                         benefits, the patient was deemed in satisfactory                         condition to undergo the procedure. The anesthesia                         plan was to use general anesthesia. Immediately prior                         to administration of medications, the patient was                         re-assessed for adequacy to receive sedatives. The                         heart rate, respiratory rate, oxygen saturations,                         blood pressure, adequacy of pulmonary ventilation, and                         response to care were monitored throughout the                         procedure. The physical status of the patient was                         re-assessed after the procedure.                        After obtaining informed consent, the colonoscope was                         passed under direct vision. Throughout the procedure,                         the patient's blood pressure, pulse, and oxygen  saturations were monitored continuously. The                         Colonoscope was introduced through the anus and                         advanced to the the cecum, identified by appendiceal                         orifice and ileocecal valve. The colonoscopy was                         performed with moderate difficulty due to multiple                         diverticula in the colon and significant looping.                         Successful completion of the procedure was aided by                         applying abdominal pressure. The patient tolerated the                         procedure well. The quality of the bowel preparation                          was adequate to identify polyps greater than 5 mm in                         size. The ileocecal valve, appendiceal orifice, and                         rectum were photographed. Findings:      The perianal and digital rectal examinations were normal. Pertinent       negatives include normal sphincter tone and no palpable rectal lesions.      The entire examined colon appeared normal.      Multiple diverticula were found in the recto-sigmoid colon and sigmoid       colon.      Unable to perform retroflexion in rectum Impression:            - The entire examined colon is normal.                        - Diverticulosis in the recto-sigmoid colon and in the                         sigmoid colon.                        - No specimens collected. Recommendation:        - Discharge patient to home (with escort).                        - Resume previous diet daily.                        - Continue present medications.                        -  Repeat colonoscopy in 5 years for screening purposes. Procedure Code(s):     --- Professional ---                        W0981, Colorectal cancer screening; colonoscopy on                         individual at high risk Diagnosis Code(s):     --- Professional ---                        Z86.010, Personal history of colonic polyps                        K57.30, Diverticulosis of large intestine without                         perforation or abscess without bleeding CPT copyright 2022 American Medical Association. All rights reserved. The codes documented in this report are preliminary and upon coder review may  be revised to meet current compliance requirements. Dr. Libby Maw Toney Reil MD, MD 10/24/2023 8:26:16 AM This report has been signed electronically. Number of Addenda: 0 Note Initiated On: 10/24/2023 7:19 AM Scope Withdrawal Time: 0 hours 6 minutes 43 seconds  Total Procedure Duration: 0 hours 18 minutes 26 seconds   Estimated Blood Loss:  Estimated blood loss: none. Estimated blood loss: none.      [facility]

## 2023-10-24 NOTE — Anesthesia Postprocedure Evaluation (Signed)
Anesthesia Post Note  Patient: Alexis Mcdonald  Procedure(s) Performed: COLONOSCOPY WITH PROPOFOL  Patient location during evaluation: PACU Anesthesia Type: General Level of consciousness: awake Pain management: satisfactory to patient Vital Signs Assessment: post-procedure vital signs reviewed and stable Respiratory status: spontaneous breathing and nonlabored ventilation Cardiovascular status: stable Anesthetic complications: no   No notable events documented.   Last Vitals:  Vitals:   10/24/23 0825 10/24/23 0834  BP: 92/61 108/72  Pulse: (!) 51   Resp:  18  Temp:    SpO2: 97% 97%    Last Pain:  Vitals:   10/24/23 0834  TempSrc:   PainSc: 0-No pain                 VAN STAVEREN,Kevionna Heffler

## 2023-10-24 NOTE — Anesthesia Preprocedure Evaluation (Signed)
Anesthesia Evaluation  Patient identified by MRN, date of birth, ID band Patient awake    Reviewed: Allergy & Precautions, NPO status , Patient's Chart, lab work & pertinent test results  Airway Mallampati: II  TM Distance: >3 FB Neck ROM: Full    Dental  (+) Teeth Intact   Pulmonary neg pulmonary ROS, Patient abstained from smoking., former smoker   Pulmonary exam normal breath sounds clear to auscultation       Cardiovascular Exercise Tolerance: Good negative cardio ROS Normal cardiovascular exam Rhythm:Regular Rate:Normal     Neuro/Psych    Depression    negative neurological ROS  negative psych ROS   GI/Hepatic negative GI ROS, Neg liver ROS,,,  Endo/Other  negative endocrine ROSHypothyroidism    Renal/GU negative Renal ROS  negative genitourinary   Musculoskeletal   Abdominal Normal abdominal exam  (+)   Peds negative pediatric ROS (+)  Hematology negative hematology ROS (+)   Anesthesia Other Findings Past Medical History: No date: Borderline osteopenia     Comment:  prior D[facility] at [facility]  No date: Cancer [facility])     Comment:  Melanoma removed from right hip 2009: Colon polyp     Comment:  next one due 2012 06/05/2021: COVID-18 March 2011: Hemochromatosis carrier     Comment:  [id number]  by March onc eval Haze Rushing) No date: History of kidney stones No date: Hypothyroidism No date: Kidney stones No date: Neutropenia     Comment:  no infections,  bone marrow biopsy by Haze Rushing next week No date: renal calculi     Comment:  s/p lithotripsy, [facility] No date: Thyroid disease     Comment:  hypothyroidism  Past Surgical History: 03/23/2010: COLONOSCOPY     Comment:  Diverticulosis 03/14/2006: COLONOSCOPY W/ BIOPSIES     Comment:  Tubular adenoma of the rectum x2, upper lesion with               focal high-grade dysplasia.  Polyps 9 mm in size. 10/21/2018: COLONOSCOPY WITH PROPOFOL; N/A     Comment:   Procedure: COLONOSCOPY WITH PROPOFOL;  Surgeon: Earline Mayotte, MD;  Location: [facility] ENDOSCOPY;  Service:               Endoscopy;  Laterality: N/A; No date: DILATION AND CURETTAGE OF UTERUS 2005: JOINT REPLACEMENT     Comment:  done in [place] No date: kidney stones removal 06/29/2016: MELANOMA EXCISION No date: right hip replacement 12/01/2018: SHOULDER ARTHROSCOPY WITH OPEN ROTATOR CUFF REPAIR; Left     Comment:  Procedure: SHOULDER ARTHROSCOPY WITH OPEN ROTATOR CUFF               REPAIR;  Surgeon: Christena Flake, MD;  Location: [facility] ORS;              Service: Orthopedics;  Laterality: Left;  BMI    Body Mass Index: 19.77 kg/m      Reproductive/Obstetrics negative OB ROS                             Anesthesia Physical Anesthesia Plan  ASA: 2  Anesthesia Plan: General   Post-op Pain Management:    Induction: Intravenous  PONV Risk Score and Plan: Propofol infusion and TIVA  Airway Management Planned: Natural Airway and Nasal Cannula  Additional Equipment:   Intra-op Plan:   Post-operative Plan:   Informed Consent:  I have reviewed the patients History and Physical, chart, labs and discussed the procedure including the risks, benefits and alternatives for the proposed anesthesia with the patient or authorized representative who has indicated his/her understanding and acceptance.     Dental Advisory Given  Plan Discussed with: CRNA and Surgeon  Anesthesia Plan Comments:        Anesthesia Quick Evaluation

## 2023-10-24 NOTE — H&P (Signed)
Alexis Repress, MD [address]  [address]  [place], [place] [address]  Main: [phone]  Fax: [phone] Pager: [phone]  Primary Care Physician:  Sherlene Shams, MD Primary Gastroenterologist:  Dr. Arlyss Mcdonald  Pre-Procedure History & Physical: HPI:  Alexis Mcdonald is a 72 y.o. female is here for an colonoscopy.   Past Medical History:  Diagnosis Date   Borderline osteopenia    prior D[facility] at [facility]    Cancer [facility])    Melanoma removed from right hip   Colon polyp 2009   next one due 2012   COVID-19 06/05/2021   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   [name]  by March onc eval [facility]aze Rushing)   History of kidney stones    Hypothyroidism    Kidney stones    Neutropenia    no infections,  bone marrow biopsy by Haze Rushing next week   renal calculi    s/p lithotripsy, [facility]   Thyroid disease    hypothyroidism    Past Surgical History:  Procedure Laterality Date   COLONOSCOPY  03/23/2010   Diverticulosis   COLONOSCOPY W/ BIOPSIES  03/14/2006   Tubular adenoma of the rectum x2, upper lesion with focal high-grade dysplasia.  Polyps 9 mm in size.   COLONOSCOPY WITH PROPOFOL N/A 10/21/2018   Procedure: COLONOSCOPY WITH PROPOFOL;  Surgeon: Earline Mayotte, MD;  Location: [facility] ENDOSCOPY;  Service: Endoscopy;  Laterality: N/A;   DILATION AND CURETTAGE OF UTERUS     JOINT REPLACEMENT  2005   done in [place]   kidney stones removal     MELANOMA EXCISION  06/29/2016   right hip replacement     SHOULDER ARTHROSCOPY WITH OPEN ROTATOR CUFF REPAIR Left 12/01/2018   Procedure: SHOULDER ARTHROSCOPY WITH OPEN ROTATOR CUFF REPAIR;  Surgeon: Christena Flake, MD;  Location: [facility] ORS;  Service: Orthopedics;  Laterality: Left;    Prior to Admission medications   Medication Sig Start Date End Date Taking? Authorizing Provider  acetaminophen (TYLENOL 8 HOUR ARTHRITIS PAIN) 650 MG CR tablet Take 2 tablets (1,300 mg total) by mouth every 8 (eight) hours as needed for  pain. 06/05/21  Yes Flinchum, Eula Fried, FNP  ALPRAZolam Prudy Feeler) 0.25 MG tablet Take 0.5 tablets (0.125 mg total) by mouth at bedtime as needed for sleep. 03/29/22  Yes Sherlene Shams, MD  azithromycin (ZITHROMAX) 500 MG tablet Take 1 tablet (500 mg total) by mouth daily. 08/20/23  Yes Sherlene Shams, MD  cholecalciferol (VITAMIN D3) 25 MCG (1000 UNIT) tablet Take 1,000 Units by mouth daily.   Yes [provider]  fish oil-omega-3 fatty acids 1000 MG capsule Take 1 g by mouth daily.    Yes [provider]  levothyroxine (SYNTHROID) 100 MCG tablet Take 1 tablet (100 mcg total) by mouth daily before breakfast. 08/20/23  Yes Sherlene Shams, MD  predniSONE (DELTASONE) 10 MG tablet 6 tablets on Day 1 , then reduce by 1 tablet daily until gone 08/20/23  Yes Sherlene Shams, MD  psyllium (REGULOID) 0.52 g capsule Take 0.52 g by mouth daily.   Yes [provider]  nicotine polacrilex (NICORETTE) 2 MG gum Take 2 mg by mouth as needed for smoking cessation.    [provider]    Allergies as of 04/02/2023 - Review Complete 04/02/2023  Allergen Reaction Noted   Penicillins  09/25/2011   Butenafine Rash 12/04/2015   Terbinafine and related Rash 06/29/2013    Family History  Problem Relation  Age of Onset   Diabetes Mother    Stroke Mother    Hyperlipidemia Mother    Hypertension Mother    Alcohol abuse Father    Diabetes Brother    Alcohol abuse Brother    Hypertension Brother    Alcohol abuse Brother    Heart disease Maternal Uncle    Breast cancer Neg Hx     Social History   Socioeconomic History   Marital status: Divorced    Spouse name: Not on file   Number of children: 2   Years of education: Not on file   Highest education level: Bachelor's degree (e.g., BA, AB, BS)  Occupational History   Occupation: Chiropractor: [employer]  Tobacco Use   Smoking status: Former    Current packs/day: 0.00    Average packs/day: 1 pack/day  for 42.0 years (42.0 ttl pk-yrs)    Types: Cigarettes    Start date: 09/24/1958    Quit date: 09/24/2000    Years since quitting: 23.0   Smokeless tobacco: Never  Vaping Use   Vaping status: Never Used  Substance and Sexual Activity   Alcohol use: Yes    Comment: "weekends"   Drug use: No   Sexual activity: Yes    Birth control/protection: None  Other Topics Concern   Not on file  Social History Narrative   Patient lived most her her life in [place] and moved to [place] to help care for aging mother   Social Determinants of Health   Financial Resource Strain: Low Risk  (03/24/2023)   Overall Financial Resource Strain (CARDIA)    Difficulty of Paying Living Expenses: Not hard at all  Food Insecurity: No Food Insecurity (03/24/2023)   Hunger Vital Sign    Worried About Running Out of Food in the Last Year: Never true    Ran Out of Food in the Last Year: Never true  Transportation Needs: No Transportation Needs (03/24/2023)   PRAPARE - Administrator, Civil Service (Medical): No    Lack of Transportation (Non-Medical): No  Physical Activity: Sufficiently Active (03/24/2023)   Exercise Vital Sign    Days of Exercise per Week: 7 days    Minutes of Exercise per Session: 60 min  Stress: No Stress Concern Present (03/24/2023)   [employer] of Occupational Health - Occupational Stress Questionnaire    Feeling of Stress : Only a little  Recent Concern: Stress - Stress Concern Present (03/03/2023)   [employer] of Occupational Health - Occupational Stress Questionnaire    Feeling of Stress : To some extent  Social Connections: Moderately Isolated (03/24/2023)   Social Connection and Isolation Panel [NHANES]    Frequency of Communication with Friends and Family: More than three times a week    Frequency of Social Gatherings with Friends and Family: Twice a week    Attends Religious Services: Never    Database administrator or Organizations: Yes    Attends Tax inspector Meetings: 1 to 4 times per year    Marital Status: Divorced  Catering manager Violence: Not At Risk (03/07/2023)   Humiliation, Afraid, Rape, and Kick questionnaire    Fear of Current or Ex-Partner: No    Emotionally Abused: No    Physically Abused: No    Sexually Abused: No    Review of Systems: See HPI, otherwise negative ROS  Physical Exam: BP (!) 143/84   Pulse (!) 48   Temp (!) 96.8 F (  36 C) (Temporal)   Wt 53.1 kg   SpO2 100%   BMI 19.77 kg/m  General:   Alert,  pleasant and cooperative in NAD Head:  Normocephalic and atraumatic. Neck:  Supple; no masses or thyromegaly. Lungs:  Clear throughout to auscultation.    Heart:  Regular rate and rhythm. Abdomen:  Soft, nontender and nondistended. Normal bowel sounds, without guarding, and without rebound.   Neurologic:  Alert and  oriented x4;  grossly normal neurologically.  Impression/Plan: Alexis Mcdonald is here for an colonoscopy to be performed for h/o colon adenoma  Risks, benefits, limitations, and alternatives regarding  colonoscopy have been reviewed with the patient.  Questions have been answered.  All parties agreeable.   Lannette Donath, MD  10/24/2023, 7:53 AM

## 2023-10-24 NOTE — Transfer of Care (Signed)
Immediate Anesthesia Transfer of Care Note  Patient: Laquandra Vought  Procedure(s) Performed: COLONOSCOPY WITH PROPOFOL  Patient Location: PACU and Endoscopy Unit  Anesthesia Type:MAC  Level of Consciousness: sedated  Airway & Oxygen Therapy: Patient Spontanous Breathing and Patient connected to nasal cannula oxygen  Post-op Assessment: Report given to RN and Post -op Vital signs reviewed and stable  Post vital signs: Reviewed and stable  Last Vitals:  Vitals Value Taken Time  BP 92/61 10/24/23 0824  Temp    Pulse 51 10/24/23 0825  Resp    SpO2 97 % 10/24/23 0825  Vitals shown include unfiled device data.  Last Pain:  Vitals:   10/24/23 0708  TempSrc: Temporal  PainSc: 0-No pain         Complications: No notable events documented.

## 2023-10-28 ENCOUNTER — Ambulatory Visit (INDEPENDENT_AMBULATORY_CARE_PROVIDER_SITE_OTHER): Payer: Medicare Other | Admitting: Student in an Organized Health Care Education/Training Program

## 2023-10-28 ENCOUNTER — Encounter: Payer: Self-pay | Admitting: Gastroenterology

## 2023-10-28 VITALS — BP 122/80 | HR 53 | Temp 97.5°F | Ht 64.5 in | Wt 116.1 lb

## 2023-10-28 DIAGNOSIS — Z9189 Other specified personal risk factors, not elsewhere classified: Secondary | ICD-10-CM | POA: Diagnosis not present

## 2023-10-28 DIAGNOSIS — R911 Solitary pulmonary nodule: Secondary | ICD-10-CM

## 2023-10-28 NOTE — Progress Notes (Signed)
Synopsis: Referred in for pulmonary nodule by Sherlene Shams, MD  Assessment & Plan:   1. Pulmonary nodule less than 1 cm in diameter with moderate to high risk for malignant neoplasm  Presenting for follow up of a pulmonary nodule noted on coronary CT in the LLL. Interval 1 year CT performed this month shows the nodule to be stable, without any changes. The CT of the chest does note a 1.1 cm ground glass opacity in the RUL that is new and will need to be followed. Discussed with the patient that this most likely represents an infectious or inflammatory nodule, but surveillance imaging would be prudent, as ground glass nodules could rarely represent an indolent and very slow grown malignancy. Will obtain a repeat CT scan of the chest in 6 months, if GGO persists, will continue with surveillance imaging.  - CT CHEST WO CONTRAST; Future   Return in about 6 months (around 04/27/2024).  I spent 30 minutes caring for this patient today, including preparing to see the patient, obtaining a medical history , reviewing a separately obtained history, performing a medically appropriate examination and/or evaluation, counseling and educating the patient/family/caregiver, ordering medications, tests, or procedures, documenting clinical information in the electronic health record, and independently interpreting results (not separately reported/billed) and communicating results to the patient/family/caregiver  Raechel Chute, MD [facility] 10/28/2023 4:42 PM    End of visit medications:  No orders of the defined types were placed in this encounter.    Current Outpatient Medications:    acetaminophen (TYLENOL 8 HOUR ARTHRITIS PAIN) 650 MG CR tablet, Take 2 tablets (1,300 mg total) by mouth every 8 (eight) hours as needed for pain., Disp: 30 tablet, Rfl: 0   ALPRAZolam (XANAX) 0.25 MG tablet, Take 0.5 tablets (0.125 mg total) by mouth at bedtime as needed for sleep., Disp: 30  tablet, Rfl: 5   cholecalciferol (VITAMIN D3) 25 MCG (1000 UNIT) tablet, Take 1,000 Units by mouth daily., Disp: , Rfl:    fish oil-omega-3 fatty acids 1000 MG capsule, Take 1 g by mouth daily. , Disp: , Rfl:    levothyroxine (SYNTHROID) 100 MCG tablet, Take 1 tablet (100 mcg total) by mouth daily before breakfast., Disp: 90 tablet, Rfl: 1   nicotine polacrilex (NICORETTE) 2 MG gum, Take 2 mg by mouth as needed for smoking cessation., Disp: , Rfl:    psyllium (REGULOID) 0.52 g capsule, Take 0.52 g by mouth daily., Disp: , Rfl:    Subjective:   PATIENT ID: Alexis Mcdonald GENDER: female DOB: [birth year], MRN: [id number]  Chief Complaint  Patient presents with   Follow-up    CT results     HPI  Patient is a 72 year old female presenting for follow up of a pulmonary nodule.    She underwent a CT of the coronaries in September of 2023 secondary to a chief complaint of dizziness. She was found to have a 3 mm LLL pulmonary nodule for which she was referred to [place]. I ordered a repeat CT scan of the chest at the one year mark to follow up the 3 mm nodule in the LLL. She is presenting today to discuss the result from said CT. She remains aysmptomatic   She is asymptomatic and in her usual state of health. She has no shortness of breath, cough, chest pain, chest tightness, wheezing, night sweats, weight loss, GI or GU symptoms.   Patient is from [place], [place] originally. She moved to [place]  and lived there until around 14 years ago. She moved to [place] to help with the care of her aging parent. She is a former smoker, having smoked a pack a day between the ages of 38 and 61. She quit 32 years ago.  Ancillary information including prior medications, full medical/surgical/family/social histories, and PFTs (when available) are listed below and have been reviewed.   Review of Systems  Constitutional:  Negative for chills, diaphoresis, fever, malaise/fatigue and weight loss.  Respiratory:   Negative for cough, hemoptysis, sputum production, shortness of breath and wheezing.   Cardiovascular:  Negative for chest pain and PND.  Musculoskeletal:  Negative for myalgias.  Skin:  Negative for rash.     Objective:   Vitals:   10/28/23 1607  BP: 122/80  Pulse: (!) 53  Temp: (!) 97.5 F (36.4 C)  SpO2: 96%  Weight: 116 lb 1.6 oz (52.7 kg)  Height: 5' 4.5" (1.638 m)   96% on RA  BMI Readings from Last 3 Encounters:  10/28/23 19.62 kg/m  10/24/23 19.77 kg/m  08/20/23 19.37 kg/m   Wt Readings from Last 3 Encounters:  10/28/23 116 lb 1.6 oz (52.7 kg)  10/24/23 117 lb (53.1 kg)  08/20/23 114 lb 9.6 oz (52 kg)    Physical Exam Constitutional:      Appearance: Normal appearance.  Cardiovascular:     Rate and Rhythm: Normal rate and regular rhythm.     Pulses: Normal pulses.     Heart sounds: Normal heart sounds.  Pulmonary:     Effort: Pulmonary effort is normal.     Breath sounds: Normal breath sounds.  Neurological:     General: No focal deficit present.     Mental Status: She is alert and oriented to person, place, and time. Mental status is at baseline.       Ancillary Information    Past Medical History:  Diagnosis Date   Borderline osteopenia    prior DEXA at [facility]    Cancer 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   [name]  by March onc eval [facility])   History of kidney stones    Hypothyroidism    Kidney stones    Neutropenia    no infections,  bone marrow biopsy by [facility] next week   renal calculi    s/p lithotripsy, [place]   Thyroid disease    hypothyroidism     Family History  Problem Relation Age of Onset   Diabetes Mother    Stroke Mother    Hyperlipidemia Mother    Hypertension Mother    Alcohol abuse Father    Diabetes Brother    Alcohol abuse Brother    Hypertension Brother    Alcohol abuse Brother    Heart disease  Maternal Uncle    Breast cancer Neg Hx      Past Surgical History:  Procedure Laterality Date   COLONOSCOPY  03/23/2010   Diverticulosis   COLONOSCOPY W/ BIOPSIES  03/14/2006   Tubular adenoma of the rectum x2, upper lesion with focal high-grade dysplasia.  Polyps 9 mm in size.   COLONOSCOPY WITH PROPOFOL N/A 10/21/2018   Procedure: COLONOSCOPY WITH PROPOFOL;  Surgeon: Earline Mayotte, MD;  Location: [facility] ENDOSCOPY;  Service: Endoscopy;  Laterality: N/A;   COLONOSCOPY WITH PROPOFOL N/A 10/24/2023   Procedure: COLONOSCOPY WITH PROPOFOL;  Surgeon: Lannette Donath  Betti Cruz, MD;  Location: [facility] ENDOSCOPY;  Service: Gastroenterology;  Laterality: N/A;   DILATION AND CURETTAGE OF UTERUS     JOINT REPLACEMENT  2005   done in [place]   kidney stones removal     MELANOMA EXCISION  06/29/2016   right hip replacement     SHOULDER ARTHROSCOPY WITH OPEN ROTATOR CUFF REPAIR Left 12/01/2018   Procedure: SHOULDER ARTHROSCOPY WITH OPEN ROTATOR CUFF REPAIR;  Surgeon: Christena Flake, MD;  Location: [facility] ORS;  Service: Orthopedics;  Laterality: Left;    Social History   Socioeconomic History   Marital status: Divorced    Spouse name: Not on file   Number of children: 2   Years of education: Not on file   Highest education level: Bachelor's degree (e.g., BA, AB, BS)  Occupational History   Occupation: Chiropractor: [employer]  Tobacco Use   Smoking status: Former    Current packs/day: 0.00    Average packs/day: 1 pack/day for 42.0 years (42.0 ttl pk-yrs)    Types: Cigarettes    Start date: 09/24/1958    Quit date: 09/24/2000    Years since quitting: 23.1   Smokeless tobacco: Never  Vaping Use   Vaping status: Never Used  Substance and Sexual Activity   Alcohol use: Yes    Comment: "weekends"   Drug use: No   Sexual activity: Yes    Birth control/protection: None  Other Topics Concern   Not on file  Social History Narrative   Patient lived most her her life in [place] and  moved to [place] to help care for aging mother   Social Determinants of Health   Financial Resource Strain: Low Risk  (03/24/2023)   Overall Financial Resource Strain (CARDIA)    Difficulty of Paying Living Expenses: Not hard at all  Food Insecurity: No Food Insecurity (03/24/2023)   Hunger Vital Sign    Worried About Running Out of Food in the Last Year: Never true    Ran Out of Food in the Last Year: Never true  Transportation Needs: No Transportation Needs (03/24/2023)   PRAPARE - Administrator, Civil Service (Medical): No    Lack of Transportation (Non-Medical): No  Physical Activity: Sufficiently Active (03/24/2023)   Exercise Vital Sign    Days of Exercise per Week: 7 days    Minutes of Exercise per Session: 60 min  Stress: No Stress Concern Present (03/24/2023)   [employer] of Occupational Health - Occupational Stress Questionnaire    Feeling of Stress : Only a little  Recent Concern: Stress - Stress Concern Present (03/03/2023)   [employer] of Occupational Health - Occupational Stress Questionnaire    Feeling of Stress : To some extent  Social Connections: Moderately Isolated (03/24/2023)   Social Connection and Isolation Panel [NHANES]    Frequency of Communication with Friends and Family: More than three times a week    Frequency of Social Gatherings with Friends and Family: Twice a week    Attends Religious Services: Never    Database administrator or Organizations: Yes    Attends Banker Meetings: 1 to 4 times per year    Marital Status: Divorced  Intimate Partner Violence: Not At Risk (03/07/2023)   Humiliation, Afraid, Rape, and Kick questionnaire    Fear of Current or Ex-Partner: No    Emotionally Abused: No    Physically Abused: No    Sexually Abused: No     Allergies  Allergen Reactions   Penicillins     Childhood allergy, welts Has patient had a PCN reaction causing immediate rash, facial/tongue/throat swelling, SOB or  lightheadedness with hypotension: No Has patient had a PCN reaction causing severe rash involving mucus membranes or skin necrosis: Yes Has patient had a PCN reaction that required hospitalization: No Has patient had a PCN reaction occurring within the last 10 years: No If all of the above answers are "NO", then may proceed with Cephalosporin use.    Betadine [Povidone Iodine] Rash    Red bumps on labia area    Butenafine Rash    Morbilliform rash coverin entire body  June 2014   Terbinafine And Related Rash    Morbilliform rash coverin entire body  June 2014     CBC    Component Value Date/Time   WBC 3.2 (L) 03/05/2023 0828   RBC 4.26 03/05/2023 0828   HGB 13.4 03/05/2023 0828   HGB 13.1 02/18/2014 1400   HCT 40.3 03/05/2023 0828   HCT 40.1 02/18/2014 1400   PLT 154.0 03/05/2023 0828   PLT 151 02/18/2014 1400   MCV 94.6 03/05/2023 0828   MCV 93 02/18/2014 1400   MCH 32.0 04/09/2021 1434   MCHC 33.3 03/05/2023 0828   RDW 12.8 03/05/2023 0828   RDW 12.8 02/18/2014 1400   LYMPHSABS 1.5 03/05/2023 0828   LYMPHSABS 1.3 02/18/2014 1400   MONOABS 0.3 03/05/2023 0828   MONOABS 0.3 02/18/2014 1400   EOSABS 0.0 03/05/2023 0828   EOSABS 0.0 02/18/2014 1400   BASOSABS 0.0 03/05/2023 0828   BASOSABS 0.0 02/18/2014 1400    Pulmonary Functions Testing Results:     No data to display          Outpatient Medications Prior to Visit  Medication Sig Dispense Refill   acetaminophen (TYLENOL 8 HOUR ARTHRITIS PAIN) 650 MG CR tablet Take 2 tablets (1,300 mg total) by mouth every 8 (eight) hours as needed for pain. 30 tablet 0   ALPRAZolam (XANAX) 0.25 MG tablet Take 0.5 tablets (0.125 mg total) by mouth at bedtime as needed for sleep. 30 tablet 5   cholecalciferol (VITAMIN D3) 25 MCG (1000 UNIT) tablet Take 1,000 Units by mouth daily.     fish oil-omega-3 fatty acids 1000 MG capsule Take 1 g by mouth daily.      levothyroxine (SYNTHROID) 100 MCG tablet Take 1 tablet (100 mcg total) by  mouth daily before breakfast. 90 tablet 1   nicotine polacrilex (NICORETTE) 2 MG gum Take 2 mg by mouth as needed for smoking cessation.     psyllium (REGULOID) 0.52 g capsule Take 0.52 g by mouth daily.     predniSONE (DELTASONE) 10 MG tablet 6 tablets on Day 1 , then reduce by 1 tablet daily until gone 21 tablet 0   No facility-administered medications prior to visit.

## 2023-11-20 DIAGNOSIS — F413 Other mixed anxiety disorders: Secondary | ICD-10-CM | POA: Diagnosis not present

## 2023-12-26 ENCOUNTER — Other Ambulatory Visit: Payer: Self-pay | Admitting: Internal Medicine

## 2023-12-26 DIAGNOSIS — Z1231 Encounter for screening mammogram for malignant neoplasm of breast: Secondary | ICD-10-CM

## 2024-01-15 ENCOUNTER — Encounter: Payer: Self-pay | Admitting: Internal Medicine

## 2024-01-15 DIAGNOSIS — F413 Other mixed anxiety disorders: Secondary | ICD-10-CM | POA: Diagnosis not present

## 2024-01-22 DIAGNOSIS — L820 Inflamed seborrheic keratosis: Secondary | ICD-10-CM | POA: Diagnosis not present

## 2024-01-22 DIAGNOSIS — L821 Other seborrheic keratosis: Secondary | ICD-10-CM | POA: Diagnosis not present

## 2024-01-22 DIAGNOSIS — D2262 Melanocytic nevi of left upper limb, including shoulder: Secondary | ICD-10-CM | POA: Diagnosis not present

## 2024-01-22 DIAGNOSIS — Z85828 Personal history of other malignant neoplasm of skin: Secondary | ICD-10-CM | POA: Diagnosis not present

## 2024-01-22 DIAGNOSIS — D225 Melanocytic nevi of trunk: Secondary | ICD-10-CM | POA: Diagnosis not present

## 2024-01-22 DIAGNOSIS — Z85820 Personal history of malignant melanoma of skin: Secondary | ICD-10-CM | POA: Diagnosis not present

## 2024-01-22 DIAGNOSIS — D2261 Melanocytic nevi of right upper limb, including shoulder: Secondary | ICD-10-CM | POA: Diagnosis not present

## 2024-01-22 DIAGNOSIS — L538 Other specified erythematous conditions: Secondary | ICD-10-CM | POA: Diagnosis not present

## 2024-01-22 DIAGNOSIS — L570 Actinic keratosis: Secondary | ICD-10-CM | POA: Diagnosis not present

## 2024-01-22 DIAGNOSIS — L2989 Other pruritus: Secondary | ICD-10-CM | POA: Diagnosis not present

## 2024-01-27 ENCOUNTER — Ambulatory Visit
Admission: RE | Admit: 2024-01-27 | Discharge: 2024-01-27 | Disposition: A | Discharge status: Home / Self Care | Payer: Medicare Other | Source: Ambulatory Visit | Attending: Internal Medicine | Admitting: Internal Medicine

## 2024-01-27 DIAGNOSIS — Z1231 Encounter for screening mammogram for malignant neoplasm of breast: Secondary | ICD-10-CM | POA: Insufficient documentation

## 2024-01-30 ENCOUNTER — Telehealth: Payer: Self-pay

## 2024-01-30 DIAGNOSIS — N200 Calculus of kidney: Secondary | ICD-10-CM

## 2024-01-30 DIAGNOSIS — D708 Other neutropenia: Secondary | ICD-10-CM

## 2024-01-30 DIAGNOSIS — E890 Postprocedural hypothyroidism: Secondary | ICD-10-CM

## 2024-01-30 DIAGNOSIS — R7301 Impaired fasting glucose: Secondary | ICD-10-CM

## 2024-01-30 DIAGNOSIS — E785 Hyperlipidemia, unspecified: Secondary | ICD-10-CM

## 2024-01-30 NOTE — Addendum Note (Signed)
Addended by: Sherlene Shams on: 01/30/2024 05:36 PM   Modules accepted: Orders

## 2024-01-30 NOTE — Telephone Encounter (Signed)
I have pended labs for your approval for pt to have doe prior to her next appt.

## 2024-01-30 NOTE — Telephone Encounter (Signed)
Copied from CRM [id number]. Topic: Clinical - Request for Lab/Test Order >> Jan 30, 2024 10:59 AM Adele Barthel wrote: Reason for CRM: Patient called in to schedule a lab visit for upcoming appointment on 02/20/2024; however, there are no lab orders in her chart, so unable to schedule her. Requesting if she could receive a call back once those have been put in so that she can schedule a lab appt. CB# [phone]

## 2024-01-30 NOTE — Addendum Note (Signed)
Addended by: Sandy Salaam on: 01/30/2024 04:52 PM   Modules accepted: Orders

## 2024-02-02 NOTE — Telephone Encounter (Signed)
Spoke with pt and scheduled her for a lab appt.  

## 2024-02-18 ENCOUNTER — Other Ambulatory Visit: Payer: Medicare Other

## 2024-02-20 ENCOUNTER — Ambulatory Visit: Payer: Medicare Other | Admitting: Internal Medicine

## 2024-03-05 ENCOUNTER — Other Ambulatory Visit: Payer: Medicare Other

## 2024-03-05 DIAGNOSIS — E785 Hyperlipidemia, unspecified: Secondary | ICD-10-CM

## 2024-03-05 DIAGNOSIS — D708 Other neutropenia: Secondary | ICD-10-CM | POA: Diagnosis not present

## 2024-03-05 DIAGNOSIS — E890 Postprocedural hypothyroidism: Secondary | ICD-10-CM

## 2024-03-05 DIAGNOSIS — N200 Calculus of kidney: Secondary | ICD-10-CM | POA: Diagnosis not present

## 2024-03-05 DIAGNOSIS — R7301 Impaired fasting glucose: Secondary | ICD-10-CM | POA: Diagnosis not present

## 2024-03-05 LAB — COMPREHENSIVE METABOLIC PANEL
ALT: 12 U/L (ref 0–35)
AST: 19 U/L (ref 0–37)
Albumin: 4.2 g/dL (ref 3.5–5.2)
Alkaline Phosphatase: 65 U/L (ref 39–117)
BUN: 24 mg/dL — ABNORMAL HIGH (ref 6–23)
CO2: 25 meq/L (ref 19–32)
Calcium: 9.1 mg/dL (ref 8.4–10.5)
Chloride: 107 meq/L (ref 96–112)
Creatinine, Ser: 0.68 mg/dL (ref 0.40–1.20)
GFR: 86.34 mL/min (ref >60.00)
Glucose, Bld: 103 mg/dL — ABNORMAL HIGH (ref 70–99)
Potassium: 5 meq/L (ref 3.5–5.1)
Sodium: 141 meq/L (ref 135–145)
Total Bilirubin: 0.3 mg/dL (ref 0.2–1.2)
Total Protein: 6.5 g/dL (ref 6.0–8.3)

## 2024-03-05 LAB — CBC WITH DIFFERENTIAL/PLATELET
Basophils Absolute: 0 10*3/uL (ref 0.0–0.1)
Basophils Relative: 1 % (ref 0.0–3.0)
Eosinophils Absolute: 0 10*3/uL (ref 0.0–0.7)
Eosinophils Relative: 1.2 % (ref 0.0–5.0)
HCT: 42 % (ref 36.0–46.0)
Hemoglobin: 13.8 g/dL (ref 12.0–15.0)
Lymphocytes Relative: 52.9 % — ABNORMAL HIGH (ref 12.0–46.0)
Lymphs Abs: 1.6 10*3/uL (ref 0.7–4.0)
MCHC: 33 g/dL (ref 30.0–36.0)
MCV: 95.7 fl (ref 78.0–100.0)
Monocytes Absolute: 0.3 10*3/uL (ref 0.1–1.0)
Monocytes Relative: 10.4 % (ref 3.0–12.0)
Neutro Abs: 1 10*3/uL — ABNORMAL LOW (ref 1.4–7.7)
Neutrophils Relative %: 34.5 % — ABNORMAL LOW (ref 43.0–77.0)
Platelets: 140 10*3/uL — ABNORMAL LOW (ref 150.0–400.0)
RBC: 4.38 Mil/uL (ref 3.87–5.11)
RDW: 13.2 % (ref 11.5–15.5)
WBC: 2.9 10*3/uL — ABNORMAL LOW (ref 4.0–10.5)

## 2024-03-05 LAB — LIPID PANEL
Cholesterol: 192 mg/dL (ref 0–200)
HDL: 96 mg/dL (ref >39.00)
LDL Cholesterol: 88 mg/dL (ref 0–99)
NonHDL: 96.3
Total CHOL/HDL Ratio: 2
Triglycerides: 41 mg/dL (ref 0.0–149.0)
VLDL: 8.2 mg/dL (ref 0.0–40.0)

## 2024-03-05 LAB — LDL CHOLESTEROL, DIRECT: Direct LDL: 81 mg/dL

## 2024-03-05 LAB — TSH: TSH: 5.99 u[IU]/mL — ABNORMAL HIGH (ref 0.35–5.50)

## 2024-03-05 LAB — HEMOGLOBIN A1C: Hgb A1c MFr Bld: 5.9 % (ref 4.6–6.5)

## 2024-03-07 ENCOUNTER — Encounter: Payer: Self-pay | Admitting: Internal Medicine

## 2024-03-07 MED ORDER — LEVOTHYROXINE SODIUM 112 MCG PO TABS: 112.0000 ug | ORAL_TABLET | Freq: Every day | ORAL | 1 refills | Status: DC

## 2024-03-07 NOTE — Addendum Note (Signed)
 Addended by: Sherlene Shams on: 03/07/2024 08:36 PM   Modules accepted: Orders

## 2024-03-08 ENCOUNTER — Telehealth: Payer: Self-pay

## 2024-03-08 DIAGNOSIS — E890 Postprocedural hypothyroidism: Secondary | ICD-10-CM

## 2024-03-08 NOTE — Telephone Encounter (Signed)
 Copied from CRM [id number]. Topic: Clinical - Request for Lab/Test Order >> Mar 08, 2024 10:30 AM Florestine Avers wrote: Reason for CRM: Patient called in stating that she needs to get her labs drawn. There is no pending labs in the chart.Patient states that Diaja messaged her on mychart and advised her to schedule the lab appointment. Please contact patient.

## 2024-03-08 NOTE — Addendum Note (Signed)
 Addended by: Sandy Salaam on: 03/08/2024 04:56 PM   Modules accepted: Orders

## 2024-03-08 NOTE — Telephone Encounter (Signed)
 Pt has been scheduled for lab appt in 6 weeks.

## 2024-03-09 ENCOUNTER — Ambulatory Visit (INDEPENDENT_AMBULATORY_CARE_PROVIDER_SITE_OTHER): Payer: Medicare Other | Admitting: *Deleted

## 2024-03-09 VITALS — Ht 64.5 in | Wt 123.0 lb

## 2024-03-09 DIAGNOSIS — Z0000 Encounter for general adult medical examination without abnormal findings: Secondary | ICD-10-CM

## 2024-03-09 NOTE — Patient Instructions (Signed)
 Alexis Mcdonald , Thank you for taking time to come for your Medicare Wellness Visit. I appreciate your ongoing commitment to your health goals. Please review the following plan we discussed and let me know if I can assist you in the future.   Referrals/Orders/Follow-Ups/Clinician Recommendations: Remember to update your tetanus vaccine.  This is a list of the screening recommended for you and due dates:  Health Maintenance  Topic Date Due   Hepatitis C Screening  Never done   DTaP/Tdap/Td vaccine (2 - Td or Tdap) 05/08/2023   COVID-19 Vaccine (8 - 2024-25 season) 11/14/2023   Mammogram  01/26/2025   Medicare Annual Wellness Visit  03/09/2025   Colon Cancer Screening  10/23/2028   Pneumonia Vaccine  Completed   Flu Shot  Completed   DEXA scan (bone density measurement)  Completed   Zoster (Shingles) Vaccine  Completed   HPV Vaccine  Aged Out    Advanced directives: (In Chart) A copy of your advanced directives are scanned into your chart should your provider ever need it.  Next Medicare Annual Wellness Visit scheduled for next year: Yes 03/14/25 @ 3:40

## 2024-03-09 NOTE — Progress Notes (Signed)
 Subjective:   Avalin Mcdonald is a 73 y.o. who presents for a Medicare Wellness preventive visit.  Visit Complete: Virtual I connected with  Alexis Mcdonald on 03/09/24 by a audio enabled telemedicine application and verified that I am speaking with the correct person using two identifiers.  Patient Location: Home  Provider Location: Office/Clinic  I discussed the limitations of evaluation and management by telemedicine. The patient expressed understanding and agreed to proceed.  Vital Signs: Because this visit was a virtual/telehealth visit, some criteria may be missing or patient reported. Any vitals not documented were not able to be obtained and vitals that have been documented are patient reported.  VideoDeclined- This patient declined Librarian, academic. Therefore the visit was completed with audio only.  AWV Questionnaire: Yes: Patient Medicare AWV questionnaire was completed by the patient on 03/08/24; I have confirmed that all information answered by patient is correct and no changes since this date.  Cardiac Risk Factors include: advanced age (>30men, >102 women)     Objective:    Today's Vitals   03/09/24 1342  Weight: 123 lb (55.8 kg)  Height: 5' 4.5" (1.638 m)   Body mass index is 20.79 kg/m.     03/09/2024    1:55 PM 10/24/2023    7:07 AM 03/07/2023   12:36 PM 03/04/2022    2:13 PM 04/09/2021    2:04 PM 02/09/2021   10:36 AM 02/09/2020   10:41 AM  Advanced Directives  Does Patient Have a Medical Advance Directive? Yes Yes Yes Yes Yes Yes Yes  Type of Estate agent of [place];Living will Living will Healthcare Power of [place];Living will Healthcare Power of [place];Living will Healthcare Power of [place];Living will Healthcare Power of [place];Living will Healthcare Power of [place];Living will  Does patient want to make changes to medical advance directive? No - Patient declined  No -  Patient declined No - Patient declined Yes (ED - Information included in AVS) No - Patient declined No - Patient declined  Copy of Healthcare Power of Attorney in Chart? Yes - validated most recent copy scanned in chart (See row information)  Yes - validated most recent copy scanned in chart (See row information) Yes - validated most recent copy scanned in chart (See row information)  Yes - validated most recent copy scanned in chart (See row information) Yes - validated most recent copy scanned in chart (See row information)    Current Medications (verified) Outpatient Encounter Medications as of 03/09/2024  Medication Sig   acetaminophen (TYLENOL 8 HOUR ARTHRITIS PAIN) 650 MG CR tablet Take 2 tablets (1,300 mg total) by mouth every 8 (eight) hours as needed for pain.   ALPRAZolam (XANAX) 0.25 MG tablet Take 0.5 tablets (0.125 mg total) by mouth at bedtime as needed for sleep.   fish oil-omega-3 fatty acids 1000 MG capsule Take 1 g by mouth daily.    ibuprofen (ADVIL) 200 MG tablet Take 200 mg by mouth every 6 (six) hours as needed.   levothyroxine (SYNTHROID) 112 MCG tablet Take 1 tablet (112 mcg total) by mouth daily before breakfast.   nicotine polacrilex (NICORETTE) 2 MG gum Take 2 mg by mouth as needed for smoking cessation.   psyllium (REGULOID) 0.52 g capsule Take 0.52 g by mouth daily.   cholecalciferol (VITAMIN D3) 25 MCG (1000 UNIT) tablet Take 1,000 Units by mouth daily. (Patient not taking: Reported on 03/09/2024)   No facility-administered encounter medications on file as of 03/09/2024.  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  Polyps 9 mm in size.   COLONOSCOPY WITH PROPOFOL N/A 10/21/2018   Procedure: COLONOSCOPY WITH PROPOFOL;  Surgeon: Earline Mayotte, MD;  Location: [facility] ENDOSCOPY;  Service: Endoscopy;  Laterality: N/A;   COLONOSCOPY WITH PROPOFOL N/A 10/24/2023   Procedure: COLONOSCOPY WITH PROPOFOL;  Surgeon: Toney Reil, MD;  Location: [facility] ENDOSCOPY;  Service: Gastroenterology;  Laterality: N/A;   DILATION AND CURETTAGE OF UTERUS     JOINT REPLACEMENT  2005   done in [place]   kidney stones removal     MELANOMA EXCISION  06/29/2016   right hip replacement     SHOULDER ARTHROSCOPY WITH OPEN ROTATOR CUFF REPAIR Left 12/01/2018   Procedure: SHOULDER ARTHROSCOPY WITH OPEN ROTATOR CUFF REPAIR;  Surgeon: Christena Flake, MD;  Location: [facility] ORS;  Service: Orthopedics;  Laterality: Left;   skin cancer removed throat  2024   Family History  Problem Relation Age of Onset   Diabetes Mother    Stroke Mother    Hyperlipidemia Mother    Hypertension Mother    Alcohol abuse Father    Diabetes Brother    Alcohol abuse Brother    Hypertension Brother    Alcohol abuse Brother    Heart disease Maternal Uncle    Breast cancer Neg Hx    Social History   Socioeconomic History   Marital status: Divorced    Spouse name: Not on file   Number of children: 2   Years of education: Not on file   Highest education level: Bachelor's degree (e.g., BA, AB, BS)  Occupational History   Occupation: Chiropractor: [employer]  Tobacco Use   Smoking status: Former     Current packs/day: 0.00    Average packs/day: 1 pack/day for 42.0 years (42.0 ttl pk-yrs)    Types: Cigarettes    Start date: 09/24/1958    Quit date: 09/24/2000    Years since quitting: 23.4   Smokeless tobacco: Never  Vaping Use   Vaping status: Never Used  Substance and Sexual Activity   Alcohol use: Yes    Comment: "weekends"   Drug use: No   Sexual activity: Yes    Birth control/protection: None  Other Topics Concern   Not on file  Social History Narrative   Patient lived most her her life in [place] and moved to [place] to help care for aging mother   Social Drivers of Health   Financial Resource Strain: Low Risk  (03/08/2024)   Overall Financial Resource Strain (CARDIA)  Difficulty of Paying Living Expenses: Not hard at all  Food Insecurity: No Food Insecurity (03/08/2024)   Hunger Vital Sign    Worried About Running Out of Food in the Last Year: Never true    Ran Out of Food in the Last Year: Never true  Transportation Needs: No Transportation Needs (03/08/2024)   PRAPARE - Administrator, Civil Service (Medical): No    Lack of Transportation (Non-Medical): No  Physical Activity: Sufficiently Active (03/08/2024)   Exercise Vital Sign    Days of Exercise per Week: 7 days    Minutes of Exercise per Session: 60 min  Stress: No Stress Concern Present (03/08/2024)   [employer] of Occupational Health - Occupational Stress Questionnaire    Feeling of Stress : Only a little  Social Connections: Socially Isolated (03/08/2024)   Social Connection and Isolation Panel [NHANES]    Frequency of Communication with Friends and Family: More than three times a week    Frequency of Social Gatherings with Friends and Family: Three times a week    Attends Religious Services: Never    Active Member of Clubs or Organizations: No    Attends Banker Meetings: Never    Marital Status: Divorced    Tobacco Counseling Counseling given: Not Answered    Clinical  Intake:  Pre-visit preparation completed: Yes  Pain : No/denies pain     BMI - recorded: 20.79 Nutritional Status: BMI of 19-24  Normal Nutritional Risks: None Diabetes: No  How often do you need to have someone help you when you read instructions, pamphlets, or other written materials from your doctor or pharmacy?: 1 - Never  Interpreter Needed?: No  Information entered by :: R. Sharlotte Baka LPN   Activities of Daily Living     03/08/2024   10:09 AM  In your present state of health, do you have any difficulty performing the following activities:  Hearing? 0  Vision? 0  Difficulty concentrating or making decisions? 0  Walking or climbing stairs? 0  Dressing or bathing? 0  Doing errands, shopping? 0  Preparing Food and eating ? N  Using the Toilet? N  In the past six months, have you accidently leaked urine? N  Do you have problems with loss of bowel control? N  Managing your Medications? N  Managing your Finances? N  Housekeeping or managing your Housekeeping? N    Patient Care Team: Sherlene Shams, MD as PCP - General (Internal Medicine) Kieth Brightly, MD (General Surgery)  Indicate any recent Medical Services you may have received from other than [facility] providers in the past year (date may be approximate).     Assessment:   This is a routine wellness examination for Alexis Mcdonald.  Hearing/Vision screen Hearing Screening - Comments:: No issues Vision Screening - Comments:: glasses   Goals Addressed             This Visit's Progress    Patient Stated       Wants to restart a new hobby       Depression Screen     03/09/2024    1:49 PM 08/20/2023    3:42 PM 03/24/2023    1:26 PM 03/07/2023   12:48 PM 03/05/2023    8:08 AM 02/17/2023    2:39 PM 07/30/2022    2:26 PM  PHQ 2/9 Scores  PHQ - 2 Score 0 0 0 0 2 4 0  PHQ- 9 Score 2 0 1 0 4 7 1  Exception Documentation    Other- indicate reason in comment box     Not completed    Visits every 2 weeks        Fall Risk     03/08/2024   10:09 AM 08/20/2023    3:42 PM 03/24/2023    1:26 PM 03/05/2023    8:08 AM 03/03/2023   11:28 AM  Fall Risk   Falls in the past year? 0 0 0 0 0  Number falls in past yr: 0 0 0 0 0  Injury with Fall? 0 0 0 0 0  Risk for fall due to : No Fall Risks No Fall Risks No Fall Risks No Fall Risks   Follow up Falls prevention discussed;Falls evaluation completed Falls evaluation completed Falls evaluation completed Falls evaluation completed Falls evaluation completed;Falls prevention discussed    MEDICARE RISK AT HOME:  Medicare Risk at Home Any stairs in or around the home?: (Patient-Rptd) Yes If so, are there any without handrails?: (Patient-Rptd) No Home free of loose throw rugs in walkways, pet beds, electrical cords, etc?: (Patient-Rptd) Yes Adequate lighting in your home to reduce risk of falls?: (Patient-Rptd) Yes Life alert?: (Patient-Rptd) No Use of a cane, walker or w/c?: (Patient-Rptd) No Grab bars in the bathroom?: (Patient-Rptd) No Shower chair or bench in shower?: (Patient-Rptd) No Elevated toilet seat or a handicapped toilet?: (Patient-Rptd) No  TIMED UP AND GO:  Was the test performed?  No  Cognitive Function: 6CIT completed        03/09/2024    1:55 PM 03/07/2023   12:50 PM 03/04/2022    2:19 PM 02/09/2020   10:49 AM 02/04/2019   12:59 PM  6CIT Screen  What Year? 0 points 0 points 0 points 0 points 0 points  What month? 0 points 0 points 0 points 0 points 0 points  What time? 0 points 0 points 0 points 0 points 0 points  Count back from 20 0 points 0 points 0 points 0 points 0 points  Months in reverse 0 points 0 points 0 points 0 points 0 points  Repeat phrase 0 points 0 points 0 points 0 points 0 points  Total Score 0 points 0 points 0 points 0 points 0 points    Immunizations Immunization History  Administered Date(s) Administered   Fluad Quad(high Dose 65+) 09/29/2023   Influenza, High Dose Seasonal PF 10/14/2019   Influenza,inj,Quad  PF,6+ Mos 09/24/2014, 10/10/2015, 08/20/2016   Influenza-Unspecified 09/29/2012, 09/29/2017, 09/08/2018, 09/18/2020, 10/26/2022   PFIZER(Purple Top)SARS-COV-2 Vaccination 02/10/2020, 03/02/2020, 09/27/2020, 09/21/2021, 10/18/2022   PNEUMOCOCCAL CONJUGATE-20 03/23/2021   Pfizer Covid-19 Vaccine Bivalent Booster 5y-11y 05/19/2021   Pneumococcal Conjugate-13 09/29/2017   Pneumococcal Polysaccharide-23 02/04/2019   Tdap 05/07/2013   Zoster Recombinant(Shingrix) 01/13/2022, 03/13/2022   Zoster, Live 06/10/2013    Screening Tests Health Maintenance  Topic Date Due   Hepatitis C Screening  Never done   DTaP/Tdap/Td (2 - Td or Tdap) 05/08/2023   COVID-19 Vaccine (7 - 2024-25 season) 08/31/2023   Medicare Annual Wellness (AWV)  03/06/2024   MAMMOGRAM  01/26/2025   Colonoscopy  10/23/2028   Pneumonia Vaccine 89+ Years old  Completed   INFLUENZA VACCINE  Completed   DEXA SCAN  Completed   Zoster Vaccines- Shingrix  Completed   HPV VACCINES  Aged Out    Health Maintenance  Health Maintenance Due  Topic Date Due   Hepatitis C Screening  Never done   DTaP/Tdap/Td (2 - Td or Tdap) 05/08/2023   COVID-19 Vaccine (7 -  2024-25 season) 08/31/2023   Medicare Annual Wellness (AWV)  03/06/2024   Health Maintenance Items Addressed: Patient declines Hepatitis C screening. Patient declines a bone density at this time and will discuss with PCP at upcoming appointment  Discuss the need to update Tetanus vaccine.  Additional Screening:  Vision Screening: Recommended annual ophthalmology exams for early detection of glaucoma and other disorders of the eye.Up to date [facility]  Dental Screening: Recommended annual dental exams for proper oral hygiene  Community Resource Referral / Chronic Care Management: CRR required this visit?  No   CCM required this visit?  No     Plan:     I have personally reviewed and noted the following in the patient's chart:   Medical and social history Use  of alcohol, tobacco or illicit drugs  Current medications and supplements including opioid prescriptions. Patient is not currently taking opioid prescriptions. Functional ability and status Nutritional status Physical activity Advanced directives List of other physicians Hospitalizations, surgeries, and ER visits in previous 12 months Vitals Screenings to include cognitive, depression, and falls Referrals and appointments  In addition, I have reviewed and discussed with patient certain preventive protocols, quality metrics, and best practice recommendations. A written personalized care plan for preventive services as well as general preventive health recommendations were provided to patient.     Sydell Axon, LPN   [id number]/1191   After Visit Summary: (MyChart) Due to this being a telephonic visit, the after visit summary with patients personalized plan was offered to patient via MyChart   Notes: Please refer to Routing Comments.

## 2024-03-11 ENCOUNTER — Ambulatory Visit (INDEPENDENT_AMBULATORY_CARE_PROVIDER_SITE_OTHER): Payer: Medicare Other | Admitting: Internal Medicine

## 2024-03-11 ENCOUNTER — Encounter: Payer: Self-pay | Admitting: Internal Medicine

## 2024-03-11 VITALS — BP 110/62 | HR 55 | Ht 64.5 in | Wt 121.2 lb

## 2024-03-11 DIAGNOSIS — Z87442 Personal history of urinary calculi: Secondary | ICD-10-CM

## 2024-03-11 DIAGNOSIS — Z87891 Personal history of nicotine dependence: Secondary | ICD-10-CM | POA: Diagnosis not present

## 2024-03-11 DIAGNOSIS — R42 Dizziness and giddiness: Secondary | ICD-10-CM | POA: Diagnosis not present

## 2024-03-11 DIAGNOSIS — E039 Hypothyroidism, unspecified: Secondary | ICD-10-CM

## 2024-03-11 DIAGNOSIS — E559 Vitamin D deficiency, unspecified: Secondary | ICD-10-CM

## 2024-03-11 DIAGNOSIS — D696 Thrombocytopenia, unspecified: Secondary | ICD-10-CM | POA: Diagnosis not present

## 2024-03-11 DIAGNOSIS — M810 Age-related osteoporosis without current pathological fracture: Secondary | ICD-10-CM

## 2024-03-11 DIAGNOSIS — I700 Atherosclerosis of aorta: Secondary | ICD-10-CM

## 2024-03-11 NOTE — Patient Instructions (Addendum)
 Suspend any products containing biotin 3 days prior to repeat thyroid test  We will repeat the CBC as well to follow the WBC and platelets   You need 1200 mg calcium minimum daily  and 800 Ius of vitamin D / D3 daily ; we'll check your D level    You will be due for your tetanus-diptheria-pertussis vaccine   (TDaP)   Please get this done at your pharmacy;  it will be PAID FOR bY MEDICARE ONLY if you get it AT [facility]

## 2024-03-11 NOTE — Progress Notes (Unsigned)
 Subjective:  Patient ID: Alexis Mcdonald, female    DOB: [birth year]  Age: 73 y.o. MRN: [id number]  CC: There were no encounter diagnoses.   HPI Alexis Mcdonald presents for  Chief Complaint  Patient presents with   Medical Management of Chronic Issues    6 month follow up     1) aortic atherosclerosis  2) Hypothyroidis  3) allergic rhinitis  4)   Outpatient Medications Prior to Visit  Medication Sig Dispense Refill   acetaminophen (TYLENOL 8 HOUR ARTHRITIS PAIN) 650 MG CR tablet Take 2 tablets (1,300 mg total) by mouth every 8 (eight) hours as needed for pain. 30 tablet 0   ALPRAZolam (XANAX) 0.25 MG tablet Take 0.5 tablets (0.125 mg total) by mouth at bedtime as needed for sleep. 30 tablet 5   fexofenadine (ALLEGRA) 180 MG tablet Take 180 mg by mouth daily.     fish oil-omega-3 fatty acids 1000 MG capsule Take 1 g by mouth daily.      fluticasone (FLONASE) 50 MCG/ACT nasal spray Place 2 sprays into both nostrils daily.     ibuprofen (ADVIL) 200 MG tablet Take 200 mg by mouth every 6 (six) hours as needed.     levothyroxine (SYNTHROID) 112 MCG tablet Take 1 tablet (112 mcg total) by mouth daily before breakfast. 90 tablet 1   Multiple Vitamin (MULTIVITAMIN) tablet Take 1 tablet by mouth daily.     nicotine polacrilex (NICORETTE) 2 MG gum Take 2 mg by mouth as needed for smoking cessation.     psyllium (REGULOID) 0.52 g capsule Take 0.52 g by mouth daily.     cholecalciferol (VITAMIN D3) 25 MCG (1000 UNIT) tablet Take 1,000 Units by mouth daily. (Patient not taking: Reported on 03/11/2024)     No facility-administered medications prior to visit.    Review of Systems;  Patient denies headache, fevers, malaise, unintentional weight loss, skin rash, eye pain, sinus congestion and sinus pain, sore throat, dysphagia,  hemoptysis , cough, dyspnea, wheezing, chest pain, palpitations, orthopnea, edema, abdominal pain, nausea, melena, diarrhea, constipation,  flank pain, dysuria, hematuria, urinary  Frequency, nocturia, numbness, tingling, seizures,  Focal weakness, Loss of consciousness,  Tremor, insomnia, depression, anxiety, and suicidal ideation.      Objective:  BP 110/62   Pulse (!) 55   Ht 5' 4.5" (1.638 m)   Wt 121 lb 3.2 oz (55 kg)   SpO2 99%   BMI 20.48 kg/m   BP Readings from Last 3 Encounters:  03/11/24 110/62  10/28/23 122/80  10/24/23 108/72    Wt Readings from Last 3 Encounters:  03/11/24 121 lb 3.2 oz (55 kg)  03/09/24 123 lb (55.8 kg)  10/28/23 116 lb 1.6 oz (52.7 kg)    Physical Exam  Lab Results  Component Value Date   HGBA1C 5.9 03/05/2024    Lab Results  Component Value Date   CREATININE 0.68 03/05/2024   CREATININE 0.72 03/05/2023   CREATININE 0.75 02/17/2023    Lab Results  Component Value Date   WBC 2.9 (L) 03/05/2024   HGB 13.8 03/05/2024   HCT 42.0 03/05/2024   PLT 140.0 (L) 03/05/2024   GLUCOSE 103 (H) 03/05/2024   CHOL 192 03/05/2024   TRIG 41.0 03/05/2024   HDL 96.00 03/05/2024   LDLDIRECT 81.0 03/05/2024   LDLCALC 88 03/05/2024   ALT 12 03/05/2024   AST 19 03/05/2024   NA 141 03/05/2024   K 5.0 03/05/2024   CL 107 03/05/2024  CREATININE 0.68 03/05/2024   BUN 24 (H) 03/05/2024   CO2 25 03/05/2024   TSH 5.99 (H) 03/05/2024   HGBA1C 5.9 03/05/2024    MM 3D SCREENING MAMMOGRAM BILATERAL BREAST Result Date: 01/29/2024 CLINICAL DATA:  Screening. EXAM: DIGITAL SCREENING BILATERAL MAMMOGRAM WITH TOMOSYNTHESIS AND CAD TECHNIQUE: Bilateral screening digital craniocaudal and mediolateral oblique mammograms were obtained. Bilateral screening digital breast tomosynthesis was performed. The images were evaluated with computer-aided detection. COMPARISON:  Previous exam(s). ACR Breast Density Category c: The breasts are heterogeneously dense, which may obscure small masses. FINDINGS: There are no findings suspicious for malignancy. IMPRESSION: No mammographic evidence of malignancy. A result  letter of this screening mammogram will be mailed directly to the patient. RECOMMENDATION: Screening mammogram in one year. (Code:SM-B-01Y) BI-RADS CATEGORY  1: Negative. Electronically Signed   By: Harmon Pier M.D.   On: 01/29/2024 10:19    Assessment & Plan:  .There are no diagnoses linked to this encounter.   I spent 34 minutes on the day of this face to face encounter reviewing patient's  most recent visit with cardiology,  nephrology,  and neurology,  prior relevant surgical and non surgical procedures, recent  labs and imaging studies, counseling on weight management,  reviewing the assessment and plan with patient, and post visit ordering and reviewing of  diagnostics and therapeutics with patient  .   Follow-up: No follow-ups on file.   Sherlene Shams, MD

## 2024-03-11 NOTE — Assessment & Plan Note (Addendum)
 Noted on left hip on 2021 DEXA .  We had Discussed treatment options,  Calcium and Vit d requirements.  She is is still contemplating use of Evista

## 2024-03-11 NOTE — Assessment & Plan Note (Signed)
 Continue  annual lung cancer screening with [facility]

## 2024-03-11 NOTE — Assessment & Plan Note (Signed)
 Resolved with reduction in nicorette use

## 2024-03-11 NOTE — Assessment & Plan Note (Signed)
 Minimal,  by recent CT.  Calcium score was zero on recent cardiac CT and lipid profile is excellent (HDL > LDL)  No treatment advise d  Lab Results  Component Value Date   CHOL 192 03/05/2024   HDL 96.00 03/05/2024   LDLCALC 88 03/05/2024   LDLDIRECT 81.0 03/05/2024   TRIG 41.0 03/05/2024   CHOLHDL 2 03/05/2024

## 2024-03-12 NOTE — Progress Notes (Signed)
 Patient ID: Alexis Mcdonald, female    DOB: [birth year]  Age: 73 y.o. MRN: [id number]  The patient is here for follow up and  management of  chronic and acute problems.   The risk factors are reflected in the social history.   The roster of all physicians providing medical care to patient - is listed in the Snapshot section of the chart.   Activities of daily living:  The patient is 100% independent in all ADLs: dressing, toileting, feeding as well as independent mobility   Home safety : The patient has smoke detectors in the home. They wear seatbelts.  There are no unsecured firearms at home. There is no violence in the home.    There is no risks for hepatitis, STDs or HIV. There is no   history of blood transfusion. They have no travel history to infectious disease endemic areas of the world.   The patient has seen their dentist in the last six month. They have seen their eye doctor in the last year. The patinet  denies slight hearing difficulty with regard to whispered voices and some television programs.  They have deferred audiologic testing in the last year.  They do not  have excessive sun exposure. Discussed the need for sun protection: hats, long sleeves and use of sunscreen if there is significant sun exposure.    Diet: the importance of a healthy diet is discussed. They do have a healthy diet.   The benefits of regular aerobic exercise were discussed. The patient  exercises  3 to 5 days per week  for  60 minutes.    Depression screen: there are no signs or vegative symptoms of depression- irritability, change in appetite, anhedonia, sadness/tearfullness.   The following portions of the patient's history were reviewed and updated as appropriate: allergies, current medications, past family history, past medical history,  past surgical history, past social history  and problem list.   Visual acuity was not assessed per patient preference since the patient has regular follow  up with an  ophthalmologist. Hearing and body mass index were assessed and reviewed.    During the course of the visit the patient was educated and counseled about appropriate screening and preventive services including : fall prevention , diabetes screening, nutrition counseling, colorectal cancer screening, and recommended immunizations.    Chief Complaint:  1) Aortic atherosclerosis Reviewed findings of prior CT scan today..  Patient has deferred statin therapy  as she has had a coronary CT which showed no disease,  CAC score zero  2) osteoporosis:  she has deferred therapy   she has no history of fractures  does not take calcium supplements due to h/o nephrolithiasis.  Corrected her understanding of need for calcium   3) hypothyroidism:  no symptoms of under or active thyroid currently   4) GAD:  worried about financial stores and outliving her funds.      Review of Symptoms  Patient denies headache, fevers, malaise, unintentional weight loss, skin rash, eye pain, sinus congestion and sinus pain, sore throat, dysphagia,  hemoptysis , cough, dyspnea, wheezing, chest pain, palpitations, orthopnea, edema, abdominal pain, nausea, melena, diarrhea, constipation, flank pain, dysuria, hematuria, urinary  Frequency, nocturia, numbness, tingling, seizures,  Focal weakness, Loss of consciousness,  Tremor, insomnia, depression, anxiety, and suicidal ideation.    Physical Exam:  BP 110/62   Pulse (!) 55   Ht 5' 4.5" (1.638 m)   Wt 121 lb 3.2 oz (55 kg)   SpO2  99%   BMI 20.48 kg/m    Physical Exam Vitals reviewed.  Constitutional:      General: She is not in acute distress.    Appearance: Normal appearance. She is well-developed and normal weight. She is not ill-appearing, toxic-appearing or diaphoretic.  HENT:     Head: Normocephalic.     Right Ear: Tympanic membrane, ear canal and external ear normal. There is no impacted cerumen.     Left Ear: Tympanic membrane, ear canal and external  ear normal. There is no impacted cerumen.     Nose: Nose normal.     Mouth/Throat:     Mouth: Mucous membranes are moist.     Pharynx: Oropharynx is clear.  Eyes:     General: No scleral icterus.       Right eye: No discharge.        Left eye: No discharge.     Conjunctiva/sclera: Conjunctivae normal.     Pupils: Pupils are equal, round, and reactive to light.  Neck:     Thyroid: No thyromegaly.     Vascular: No carotid bruit or JVD.  Cardiovascular:     Rate and Rhythm: Normal rate and regular rhythm.     Heart sounds: Normal heart sounds.  Pulmonary:     Effort: Pulmonary effort is normal. No respiratory distress.     Breath sounds: Normal breath sounds.  Chest:  Breasts:    Breasts are symmetrical.     Right: Normal. No swelling, inverted nipple, mass, nipple discharge, skin change or tenderness.     Left: Normal. No swelling, inverted nipple, mass, nipple discharge, skin change or tenderness.  Abdominal:     General: Bowel sounds are normal.     Palpations: Abdomen is soft. There is no mass.     Tenderness: There is no abdominal tenderness. There is no guarding or rebound.  Musculoskeletal:        General: Normal range of motion.     Cervical back: Normal range of motion and neck supple.  Lymphadenopathy:     Cervical: No cervical adenopathy.     Upper Body:     Right upper body: No supraclavicular, axillary or pectoral adenopathy.     Left upper body: No supraclavicular, axillary or pectoral adenopathy.  Skin:    General: Skin is warm and dry.  Neurological:     General: No focal deficit present.     Mental Status: She is alert and oriented to person, place, and time. Mental status is at baseline.  Psychiatric:        Mood and Affect: Mood normal.        Behavior: Behavior normal.        Thought Content: Thought content normal.        Judgment: Judgment normal.    Assessment and Plan: Atherosclerosis of aorta (HCC) Assessment & Plan: Minimal,  by recent CT.   Calcium score was zero on recent cardiac CT and lipid profile is excellent (HDL > LDL)  No treatment advise d  Lab Results  Component Value Date   CHOL 192 03/05/2024   HDL 96.00 03/05/2024   LDLCALC 88 03/05/2024   LDLDIRECT 81.0 03/05/2024   TRIG 41.0 03/05/2024   CHOLHDL 2 03/05/2024      Age-related osteoporosis without current pathological fracture Assessment & Plan: Noted on left hip on 2021 DEXA .  We had Discussed treatment options,  Calcium and Vit d requirements.  She continues to defer use  of pharmacotherapy  Thrombocytopenia (HCC) Assessment & Plan: Mild,  stable  with mild leukopenia as well.  Repeat in 6 weeks  prior BM biopsy noted  mild dyspoiesis and hypocellular marrow  with normal karyotype and negative screen for ID cuases.    Orders: -     CBC with Differential/Platelet; Future  Vitamin D deficiency -     VITAMIN D 25 Hydroxy (Vit-D Deficiency, Fractures); Future  History of tobacco use Assessment & Plan: Continue  annual lung cancer screening with [facility]    Episodic lightheadedness Assessment & Plan: Resolved with reduction in nicorette use    Acquired hypothyroidism Assessment & Plan: TSH iwas high one week ago and dose was increased to 112 mcg daily.   Reviewed possibility of biotin use.  Repeat TSH in 6 weeks  Lab Results  Component Value Date   TSH 5.99 (H) 03/05/2024      History of nephrolithiasis Assessment & Plan: Corrected her misunderstanding about calcium supplementation; it is NOT CONTRAINDICATED     I provided  30 minutes  during this encounter reviewing patient's current problems and past surgeries, labs and imaging studies, providing counseling on the above mentioned problems I na face to face visit  , and coordination  of care .   Return in about 1 year (around 03/11/2025).  Sherlene Shams, MD

## 2024-03-12 NOTE — Assessment & Plan Note (Signed)
 Mild,  stable  with mild leukopenia as well.  Repeat in 6 weeks  prior BM biopsy noted  mild dyspoiesis and hypocellular marrow  with normal karyotype and negative screen for ID cuases.

## 2024-03-12 NOTE — Assessment & Plan Note (Signed)
 Corrected her misunderstanding about calcium supplementation; it is NOT CONTRAINDICATED

## 2024-03-12 NOTE — Assessment & Plan Note (Signed)
 TSH iwas high one week ago and dose was increased to 112 mcg daily.   Reviewed possibility of biotin use.  Repeat TSH in 6 weeks  Lab Results  Component Value Date   TSH 5.99 (H) 03/05/2024

## 2024-03-14 ENCOUNTER — Encounter: Payer: Self-pay | Admitting: Internal Medicine

## 2024-03-15 MED ORDER — LEVOTHYROXINE SODIUM 100 MCG PO TABS: 100.0000 ug | ORAL_TABLET | Freq: Every day | ORAL | 0 refills | Status: DC

## 2024-03-18 ENCOUNTER — Telehealth: Payer: Self-pay

## 2024-03-18 DIAGNOSIS — F413 Other mixed anxiety disorders: Secondary | ICD-10-CM | POA: Diagnosis not present

## 2024-03-18 NOTE — Telephone Encounter (Signed)
 Copied from CRM [id number]. Topic: Clinical - Medication Question >> Mar 18, 2024  3:39 PM Kathryne Eriksson wrote: Reason for CRM: Medication Change >> Mar 18, 2024  3:40 PM Kathryne Eriksson wrote: Patient states she has some concerns about her levothyroxine (SYNTHROID) 100 MCG tablet. Patient call back number is [phone]

## 2024-03-19 ENCOUNTER — Encounter: Payer: Self-pay | Admitting: Internal Medicine

## 2024-03-22 ENCOUNTER — Encounter: Payer: Self-pay | Admitting: Student in an Organized Health Care Education/Training Program

## 2024-03-22 NOTE — Telephone Encounter (Signed)
 I called and spoke with Mrs. Alexis Mcdonald. She decided to schedule her CT on 03/24/24 @10 :30am at [facility]. She didn't want to waste the doctors time or hers

## 2024-03-22 NOTE — Telephone Encounter (Signed)
 Dr. Aundria Rud CT order states to schedule the 1st week of May

## 2024-03-22 NOTE — Telephone Encounter (Signed)
 Is there anyway to get the CT done today or do we need to reschedule her appt once the CT is scheduled?

## 2024-03-24 ENCOUNTER — Ambulatory Visit
Admission: RE | Admit: 2024-03-24 | Discharge: 2024-03-24 | Disposition: A | Discharge status: Home / Self Care | Source: Ambulatory Visit | Attending: Internal Medicine | Admitting: Internal Medicine

## 2024-03-24 ENCOUNTER — Ambulatory Visit: Payer: Medicare Other | Admitting: Student in an Organized Health Care Education/Training Program

## 2024-03-24 ENCOUNTER — Encounter: Payer: Self-pay | Admitting: Student in an Organized Health Care Education/Training Program

## 2024-03-24 VITALS — BP 100/68 | HR 61 | Resp 16 | Ht 64.5 in | Wt 121.2 lb

## 2024-03-24 DIAGNOSIS — Z9189 Other specified personal risk factors, not elsewhere classified: Secondary | ICD-10-CM

## 2024-03-24 DIAGNOSIS — R911 Solitary pulmonary nodule: Secondary | ICD-10-CM

## 2024-03-24 DIAGNOSIS — R918 Other nonspecific abnormal finding of lung field: Secondary | ICD-10-CM | POA: Diagnosis not present

## 2024-03-24 NOTE — Progress Notes (Unsigned)
 Synopsis: Referred in *** by Sherlene Shams, MD  Assessment & Plan:   1. Pulmonary nodule less than 1 cm in diameter with moderate to high risk for malignant neoplasm (Primary) Stable RUL GGO, stable LLL nodule. Repeat CT in 1 year.  - CT CHEST WO CONTRAST; Future   Return in about 1 year (around 03/24/2025).  I spent *** minutes caring for this patient today, including {EM billing:[id number]}  Raechel Chute, MD [facility] 03/24/2024 2:46 PM    End of visit medications:  No orders of the defined types were placed in this encounter.    Current Outpatient Medications:    acetaminophen (TYLENOL 8 HOUR ARTHRITIS PAIN) 650 MG CR tablet, Take 2 tablets (1,300 mg total) by mouth every 8 (eight) hours as needed for pain., Disp: 30 tablet, Rfl: 0   ALPRAZolam (XANAX) 0.25 MG tablet, Take 0.5 tablets (0.125 mg total) by mouth at bedtime as needed for sleep., Disp: 30 tablet, Rfl: 5   fexofenadine (ALLEGRA) 180 MG tablet, Take 180 mg by mouth daily., Disp: , Rfl:    fish oil-omega-3 fatty acids 1000 MG capsule, Take 1 g by mouth daily. , Disp: , Rfl:    fluticasone (FLONASE) 50 MCG/ACT nasal spray, Place 2 sprays into both nostrils daily., Disp: , Rfl:    ibuprofen (ADVIL) 200 MG tablet, Take 200 mg by mouth every 6 (six) hours as needed., Disp: , Rfl:    levothyroxine (SYNTHROID) 100 MCG tablet, Take 1 tablet (100 mcg total) by mouth daily. Add One extra tablet weekly, Disp: 103 tablet, Rfl: 0   Multiple Vitamin (MULTIVITAMIN) tablet, Take 1 tablet by mouth daily., Disp: , Rfl:    nicotine polacrilex (NICORETTE) 2 MG gum, Take 2 mg by mouth as needed for smoking cessation., Disp: , Rfl:    psyllium (REGULOID) 0.52 g capsule, Take 0.52 g by mouth daily., Disp: , Rfl:    Subjective:   PATIENT ID: Alexis Mcdonald GENDER: female DOB: 23-Dec-1951, MRN: [id number]  Chief Complaint  Patient presents with   Follow-up    HPI ***  Ancillary information  including prior medications, full medical/surgical/family/social histories, and PFTs (when available) are listed below and have been reviewed.   ROS   Objective:   Vitals:   03/24/24 1432  BP: 100/68  Pulse: 61  Resp: 16  SpO2: 98%  Weight: 121 lb 3.2 oz (55 kg)  Height: 5' 4.5" (1.638 m)   98% on *** LPM *** RA BMI Readings from Last 3 Encounters:  03/24/24 20.48 kg/m  03/11/24 20.48 kg/m  03/09/24 20.79 kg/m   Wt Readings from Last 3 Encounters:  03/24/24 121 lb 3.2 oz (55 kg)  03/11/24 121 lb 3.2 oz (55 kg)  03/09/24 123 lb (55.8 kg)    Physical Exam    Ancillary Information    Past Medical History:  Diagnosis Date   Borderline osteopenia    prior DEXA at [facility]    Cancer [facility])    Melanoma removed from right hip   Colon polyp 2009   next one due 2012   COVID-19 06/05/2021   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   [name]  by March onc eval [facility])   History of kidney stones    Hypothyroidism    Kidney stones    Neutropenia    no infections,  bone marrow biopsy by [facility] next week   renal calculi    s/p lithotripsy, [place]   Thyroid disease  hypothyroidism     Family History  Problem Relation Age of Onset   Diabetes Mother    Stroke Mother    Hyperlipidemia Mother    Hypertension Mother    Alcohol abuse Father    Diabetes Brother    Alcohol abuse Brother    Hypertension Brother    Alcohol abuse Brother    Heart disease Maternal Uncle    Breast cancer Neg Hx      Past Surgical History:  Procedure Laterality Date   COLONOSCOPY  03/23/2010   Diverticulosis   COLONOSCOPY W/ BIOPSIES  03/14/2006   Tubular adenoma of the rectum x2, upper lesion with focal high-grade dysplasia.  Polyps 9 mm in size.   COLONOSCOPY WITH PROPOFOL N/A 10/21/2018   Procedure: COLONOSCOPY WITH PROPOFOL;  Surgeon: Earline Mayotte, MD;  Location: [facility] ENDOSCOPY;  Service: Endoscopy;  Laterality: N/A;   COLONOSCOPY WITH PROPOFOL N/A 10/24/2023   Procedure:  COLONOSCOPY WITH PROPOFOL;  Surgeon: Toney Reil, MD;  Location: [facility] ENDOSCOPY;  Service: Gastroenterology;  Laterality: N/A;   DILATION AND CURETTAGE OF UTERUS     JOINT REPLACEMENT  2005   done in [place]   kidney stones removal     MELANOMA EXCISION  06/29/2016   right hip replacement     SHOULDER ARTHROSCOPY WITH OPEN ROTATOR CUFF REPAIR Left 12/01/2018   Procedure: SHOULDER ARTHROSCOPY WITH OPEN ROTATOR CUFF REPAIR;  Surgeon: Christena Flake, MD;  Location: [facility] ORS;  Service: Orthopedics;  Laterality: Left;   skin cancer removed throat  2024    Social History   Socioeconomic History   Marital status: Divorced    Spouse name: Not on file   Number of children: 2   Years of education: Not on file   Highest education level: Bachelor's degree (e.g., BA, AB, BS)  Occupational History   Occupation: Chiropractor: [employer]  Tobacco Use   Smoking status: Former    Current packs/day: 0.00    Average packs/day: 1 pack/day for 42.0 years (42.0 ttl pk-yrs)    Types: Cigarettes    Start date: 09/24/1958    Quit date: 09/24/2000    Years since quitting: 23.5   Smokeless tobacco: Never  Vaping Use   Vaping status: Never Used  Substance and Sexual Activity   Alcohol use: Yes    Comment: "weekends"   Drug use: No   Sexual activity: Yes    Birth control/protection: None  Other Topics Concern   Not on file  Social History Narrative   Patient lived most her her life in [place] and moved to [place] to help care for aging mother   Social Drivers of Corporate investment banker Strain: Low Risk  (03/08/2024)   Overall Financial Resource Strain (CARDIA)    Difficulty of Paying Living Expenses: Not hard at all  Food Insecurity: No Food Insecurity (03/08/2024)   Hunger Vital Sign    Worried About Running Out of Food in the Last Year: Never true    Ran Out of Food in the Last Year: Never true  Transportation Needs: No Transportation Needs (03/08/2024)   PRAPARE -  Administrator, Civil Service (Medical): No    Lack of Transportation (Non-Medical): No  Physical Activity: Sufficiently Active (03/08/2024)   Exercise Vital Sign    Days of Exercise per Week: 7 days    Minutes of Exercise per Session: 60 min  Stress: No Stress Concern Present (03/08/2024)   [employer]  of Occupational Health - Occupational Stress Questionnaire    Feeling of Stress : Only a little  Social Connections: Socially Isolated (03/08/2024)   Social Connection and Isolation Panel [NHANES]    Frequency of Communication with Friends and Family: More than three times a week    Frequency of Social Gatherings with Friends and Family: Three times a week    Attends Religious Services: Never    Active Member of Clubs or Organizations: No    Attends Banker Meetings: Never    Marital Status: Divorced  Catering manager Violence: Not At Risk (03/09/2024)   Humiliation, Afraid, Rape, and Kick questionnaire    Fear of Current or Ex-Partner: No    Emotionally Abused: No    Physically Abused: No    Sexually Abused: No     Allergies  Allergen Reactions   Penicillins     Childhood allergy, welts Has patient had a PCN reaction causing immediate rash, facial/tongue/throat swelling, SOB or lightheadedness with hypotension: No Has patient had a PCN reaction causing severe rash involving mucus membranes or skin necrosis: Yes Has patient had a PCN reaction that required hospitalization: No Has patient had a PCN reaction occurring within the last 10 years: No If all of the above answers are "NO", then may proceed with Cephalosporin use.    Betadine [Povidone Iodine] Rash    Red bumps on labia area    Butenafine Rash    Morbilliform rash coverin entire body  June 2014   Terbinafine And Related Rash    Morbilliform rash coverin entire body  June 2014     CBC    Component Value Date/Time   WBC 2.9 (L) 03/05/2024 0853   RBC 4.38 03/05/2024 0853   HGB 13.8  03/05/2024 0853   HGB 13.1 02/18/2014 1400   HCT 42.0 03/05/2024 0853   HCT 40.1 02/18/2014 1400   PLT 140.0 (L) 03/05/2024 0853   PLT 151 02/18/2014 1400   MCV 95.7 03/05/2024 0853   MCV 93 02/18/2014 1400   MCH 32.0 04/09/2021 1434   MCHC 33.0 03/05/2024 0853   RDW 13.2 03/05/2024 0853   RDW 12.8 02/18/2014 1400   LYMPHSABS 1.6 03/05/2024 0853   LYMPHSABS 1.3 02/18/2014 1400   MONOABS 0.3 03/05/2024 0853   MONOABS 0.3 02/18/2014 1400   EOSABS 0.0 03/05/2024 0853   EOSABS 0.0 02/18/2014 1400   BASOSABS 0.0 03/05/2024 0853   BASOSABS 0.0 02/18/2014 1400    Pulmonary Functions Testing Results:     No data to display          Outpatient Medications Prior to Visit  Medication Sig Dispense Refill   acetaminophen (TYLENOL 8 HOUR ARTHRITIS PAIN) 650 MG CR tablet Take 2 tablets (1,300 mg total) by mouth every 8 (eight) hours as needed for pain. 30 tablet 0   ALPRAZolam (XANAX) 0.25 MG tablet Take 0.5 tablets (0.125 mg total) by mouth at bedtime as needed for sleep. 30 tablet 5   fexofenadine (ALLEGRA) 180 MG tablet Take 180 mg by mouth daily.     fish oil-omega-3 fatty acids 1000 MG capsule Take 1 g by mouth daily.      fluticasone (FLONASE) 50 MCG/ACT nasal spray Place 2 sprays into both nostrils daily.     ibuprofen (ADVIL) 200 MG tablet Take 200 mg by mouth every 6 (six) hours as needed.     levothyroxine (SYNTHROID) 100 MCG tablet Take 1 tablet (100 mcg total) by mouth daily. Add One extra tablet weekly 103  tablet 0   Multiple Vitamin (MULTIVITAMIN) tablet Take 1 tablet by mouth daily.     nicotine polacrilex (NICORETTE) 2 MG gum Take 2 mg by mouth as needed for smoking cessation.     psyllium (REGULOID) 0.52 g capsule Take 0.52 g by mouth daily.     No facility-administered medications prior to visit.

## 2024-04-07 DIAGNOSIS — Z23 Encounter for immunization: Secondary | ICD-10-CM | POA: Diagnosis not present

## 2024-04-22 ENCOUNTER — Other Ambulatory Visit (INDEPENDENT_AMBULATORY_CARE_PROVIDER_SITE_OTHER)

## 2024-04-22 DIAGNOSIS — E890 Postprocedural hypothyroidism: Secondary | ICD-10-CM

## 2024-04-22 DIAGNOSIS — D696 Thrombocytopenia, unspecified: Secondary | ICD-10-CM

## 2024-04-22 DIAGNOSIS — E559 Vitamin D deficiency, unspecified: Secondary | ICD-10-CM | POA: Diagnosis not present

## 2024-04-22 NOTE — Addendum Note (Signed)
 Addended by: Thressa Flora D on: 04/22/2024 02:06 PM   Modules accepted: Orders

## 2024-04-23 ENCOUNTER — Encounter: Payer: Self-pay | Admitting: Internal Medicine

## 2024-04-23 LAB — TSH: TSH: 2.49 u[IU]/mL (ref 0.35–5.50)

## 2024-05-26 ENCOUNTER — Ambulatory Visit
Admission: RE | Admit: 2024-05-26 | Discharge: 2024-05-26 | Disposition: A | Discharge status: Home / Self Care | Source: Ambulatory Visit | Attending: Urology | Admitting: Urology

## 2024-05-26 ENCOUNTER — Encounter: Payer: Self-pay | Admitting: Urology

## 2024-05-26 ENCOUNTER — Ambulatory Visit (INDEPENDENT_AMBULATORY_CARE_PROVIDER_SITE_OTHER): Payer: Self-pay | Admitting: Urology

## 2024-05-26 VITALS — BP 126/79 | HR 56 | Ht 65.0 in | Wt 120.0 lb

## 2024-05-26 DIAGNOSIS — R3129 Other microscopic hematuria: Secondary | ICD-10-CM | POA: Diagnosis not present

## 2024-05-26 DIAGNOSIS — N200 Calculus of kidney: Secondary | ICD-10-CM

## 2024-05-26 DIAGNOSIS — N362 Urethral caruncle: Secondary | ICD-10-CM

## 2024-05-26 DIAGNOSIS — Z96641 Presence of right artificial hip joint: Secondary | ICD-10-CM | POA: Diagnosis not present

## 2024-05-26 LAB — MICROSCOPIC EXAMINATION

## 2024-05-26 LAB — URINALYSIS, COMPLETE
Bilirubin, UA: NEGATIVE
Glucose, UA: NEGATIVE
Ketones, UA: NEGATIVE
Leukocytes,UA: NEGATIVE
Nitrite, UA: NEGATIVE
Protein,UA: NEGATIVE
Specific Gravity, UA: 1.01 (ref 1.005–1.030)
Urobilinogen, Ur: 0.2 mg/dL (ref 0.2–1.0)
pH, UA: 6.5 (ref 5.0–7.5)

## 2024-05-26 NOTE — Progress Notes (Signed)
 05/26/2024 3:23 PM   Alexis Mcdonald Max Spain 06-22-1951 [id number]  Referring provider: Thersia Flax, MD [address] [place],  [place] [address]  Urological history: 1.  Nephrolithiasis - remote hx of ESWL - contrast CT (2024) several non obstructing left renal upper pole calculi measuring up to 10 mm  2.  High risk hematuria -former smoker -contrast CT (2024) -left nephrolithiasis -cysto (2024) -urethral caruncle  Chief Complaint  Patient presents with   Follow-up   HPI: Alexis Mcdonald is a 73 y.o. woman who presents today for yearly follow up.   Previous records reviewed.   She feels well today.  Patient denies any modifying or aggravating factors.  Patient denies any recent UTI's, gross hematuria, dysuria or suprapubic/flank pain.  Patient denies any fevers, chills, nausea or vomiting.    UA yellow clear, specific gravity 1.010, pH 6.5, 1+ blood, 0-5 WBCs, 11-30 RBCs, 0-10 epithelial cells, amorphous sediment present and a few bacteria.  KUB cluster of left renal stones   PMH: Past Medical History:  Diagnosis Date   Borderline osteopenia    prior DEX[facility] at [facility]    Cancer [facility])    Melanoma removed from right hip   Colon polyp 2009   next one due 2012   COVID-19 06/05/2021   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   [name]  by March onc eval Nedra Ball)   History of kidney stones    Hypothyroidism    Kidney stones    Neutropenia    no infections,  bone marrow biopsy by Nedra Ball next week   renal calculi    s/p lithotripsy, [facility]   Thyroid  disease    hypothyroidism    Surgical History: Past Surgical History:  Procedure Laterality Date   COLONOSCOPY  03/23/2010   Diverticulosis   COLONOSCOPY W/ BIOPSIES  03/14/2006   Tubular adenoma of the rectum x2, upper lesion with focal high-grade dysplasia.  Polyps 9 mm in size.   COLONOSCOPY WITH PROPOFOL  N/A 10/21/2018   Procedure: COLONOSCOPY WITH PROPOFOL ;  Surgeon: Marshall Skeeter, MD;  Location: [facility] ENDOSCOPY;  Service: Endoscopy;  Laterality: N/A;   COLONOSCOPY WITH PROPOFOL  N/A 10/24/2023   Procedure: COLONOSCOPY WITH PROPOFOL ;  Surgeon: Selena Daily, MD;  Location: [facility] ENDOSCOPY;  Service: Gastroenterology;  Laterality: N/A;   DILATION AND CURETTAGE OF UTERUS     JOINT REPLACEMENT  2005   done in [place]    kidney stones removal     MELANOMA EXCISION  06/29/2016   right hip replacement     SHOULDER ARTHROSCOPY WITH OPEN ROTATOR CUFF REPAIR Left 12/01/2018   Procedure: SHOULDER ARTHROSCOPY WITH OPEN ROTATOR CUFF REPAIR;  Surgeon: Elner Hahn, MD;  Location: [facility] ORS;  Service: Orthopedics;  Laterality: Left;   skin cancer removed throat  2024    Home Medications:  Allergies as of 05/26/2024       Reactions   Penicillins    Childhood allergy, welts Has patient had a PCN reaction causing immediate rash, facial/tongue/throat swelling, SOB or lightheadedness with hypotension: No Has patient had a PCN reaction causing severe rash involving mucus membranes or skin necrosis: Yes Has patient had a PCN reaction that required hospitalization: No Has patient had a PCN reaction occurring within the last 10 years: No If all of the above answers are "NO", then may proceed with Cephalosporin use.   Betadine [povidone Iodine] Rash   Red bumps on labia area    Butenafine Rash   Morbilliform rash  coverin entire body  June 2014   Terbinafine  And Related Rash   Morbilliform rash coverin entire body  June 2014        Medication List        Accurate as of May 26, 2024  3:23 PM. If you have any questions, ask your nurse or doctor.          STOP taking these medications    acetaminophen  650 MG CR tablet Commonly known as: Tylenol  8 Hour Arthritis Pain Stopped by: Cathleen Coach Rickard Kennerly   fexofenadine 180 MG tablet Commonly known as: ALLEGRA Stopped by: Shivani Barrantes   multivitamin tablet Stopped by: Cathleen Coach Lyric Rossano       TAKE these  medications    ALPRAZolam  0.25 MG tablet Commonly known as: XANAX  Take 0.5 tablets (0.125 mg total) by mouth at bedtime as needed for sleep.   fish oil-omega-3 fatty acids 1000 MG capsule Take 1 g by mouth daily.   fluticasone 50 MCG/ACT nasal spray Commonly known as: FLONASE Place 2 sprays into both nostrils daily.   ibuprofen 200 MG tablet Commonly known as: ADVIL Take 200 mg by mouth every 6 (six) hours as needed.   levothyroxine  100 MCG tablet Commonly known as: SYNTHROID  Take 1 tablet (100 mcg total) by mouth daily. Add One extra tablet weekly   nicotine polacrilex 2 MG gum Commonly known as: NICORETTE Take 2 mg by mouth as needed for smoking cessation.   psyllium 0.52 g capsule Commonly known as: REGULOID Take 0.52 g by mouth daily.        Allergies:  Allergies  Allergen Reactions   Penicillins     Childhood allergy, welts Has patient had a PCN reaction causing immediate rash, facial/tongue/throat swelling, SOB or lightheadedness with hypotension: No Has patient had a PCN reaction causing severe rash involving mucus membranes or skin necrosis: Yes Has patient had a PCN reaction that required hospitalization: No Has patient had a PCN reaction occurring within the last 10 years: No If all of the above answers are "NO", then may proceed with Cephalosporin use.    Betadine [Povidone Iodine] Rash    Red bumps on labia area    Butenafine Rash    Morbilliform rash coverin entire body  June 2014   Terbinafine  And Related Rash    Morbilliform rash coverin entire body  June 2014    Family History: Family History  Problem Relation Age of Onset   Diabetes Mother    Stroke Mother    Hyperlipidemia Mother    Hypertension Mother    Alcohol abuse Father    Diabetes Brother    Alcohol abuse Brother    Hypertension Brother    Alcohol abuse Brother    Heart disease Maternal Uncle    Breast cancer Neg Hx     Social History:  reports that she quit smoking about 23  years ago. Her smoking use included cigarettes. She started smoking about 65 years ago. She has a 42 pack-year smoking history. She has never used smokeless tobacco. She reports current alcohol use. She reports that she does not use drugs.  ROS: Pertinent ROS in HPI  Physical Exam: BP 126/79   Pulse (!) 56   Ht 5\' 5"  (1.651 m)   Wt 120 lb (54.4 kg)   BMI 19.97 kg/m   Constitutional:  Well nourished. Alert and oriented, No acute distress. HEENT: [place] AT, moist mucus membranes.  Trachea midline Cardiovascular: No clubbing, cyanosis, or edema. Respiratory: Normal respiratory effort, no increased  work of breathing. Neurologic: Grossly intact, no focal deficits, moving all 4 extremities. Psychiatric: Normal mood and affect.  Laboratory Data: Lab Results  Component Value Date   WBC 2.9 (L) 03/05/2024   HGB 13.8 03/05/2024   HCT 42.0 03/05/2024   MCV 95.7 03/05/2024   PLT 140.0 (L) 03/05/2024   Lab Results  Component Value Date   CREATININE 0.68 03/05/2024   Lab Results  Component Value Date   HGBA1C 5.9 03/05/2024   Lab Results  Component Value Date   TSH 2.49 04/22/2024       Component Value Date/Time   CHOL 192 03/05/2024 0853   CHOL 183 04/29/2012 0000   HDL 96.00 03/05/2024 0853   HDL 99 04/29/2012 0000   CHOLHDL 2 03/05/2024 0853   VLDL 8.2 03/05/2024 0853   LDLCALC 88 03/05/2024 0853   LDLCALC 75 04/29/2012 0000    Lab Results  Component Value Date   AST 19 03/05/2024   Lab Results  Component Value Date   ALT 12 03/05/2024   Urinalysis See EPIC and HPI  I have reviewed the labs.   Pertinent Imaging: KUB radiologist interpretation still pending I have independently reviewed the films.  See HPI.    Assessment & Plan:    1. Left renal stones - KUB cluster of left renal stones - Explained they may be the cause for the increase in micro heme - Denies any renal colic, continue to monitor  2. Microscopic hematuria - UA with micro heme - Urine  culture pending - If urine culture is positive for bacterial growth, we will go ahead and treat with antibiotics and then recheck the urine in 2 months to ensure microscopic hematuria does not persist - If the culture is negative or microscopic hematuria persists despite antibiotic use, we will have a trial of vaginal estrogen cream for 1 month and then recheck the urine as this may be due to the urethral caruncle  3. Urethral caruncle - She is hesitant to start vaginal estrogen cream at this time, will we consider if microscopic hematuria persists - see # 2   Return for pending urine culture results .  These notes generated with voice recognition software. I apologize for typographical errors.  Briant Camper  [facility] [address]  [address] [place], [place] [address] [phone]

## 2024-05-27 DIAGNOSIS — F413 Other mixed anxiety disorders: Secondary | ICD-10-CM | POA: Diagnosis not present

## 2024-05-29 ENCOUNTER — Ambulatory Visit: Payer: Self-pay | Admitting: Urology

## 2024-05-29 LAB — CULTURE, URINE COMPREHENSIVE

## 2024-05-31 ENCOUNTER — Other Ambulatory Visit: Payer: Self-pay | Admitting: Urology

## 2024-05-31 DIAGNOSIS — N952 Postmenopausal atrophic vaginitis: Secondary | ICD-10-CM

## 2024-05-31 MED ORDER — PREMARIN 0.625 MG/GM VA CREA: 1.0000 | TOPICAL_CREAM | Freq: Every day | VAGINAL | 12 refills | Status: DC

## 2024-06-01 ENCOUNTER — Other Ambulatory Visit: Payer: Self-pay | Admitting: Urology

## 2024-06-02 ENCOUNTER — Other Ambulatory Visit: Payer: Self-pay | Admitting: Urology

## 2024-06-02 MED ORDER — ESTRADIOL 0.1 MG/GM VA CREA: TOPICAL_CREAM | VAGINAL | 12 refills | Status: AC

## 2024-07-13 ENCOUNTER — Other Ambulatory Visit: Payer: Self-pay | Admitting: Internal Medicine

## 2024-07-21 NOTE — Progress Notes (Unsigned)
 07/22/2024 2:18 PM   Alexis Mcdonald Alexis Mcdonald [birth year] [id number]  Referring provider: Marylynn Verneita CROME, MD [address] [place],  [place] [address]  Urological history: 1.  Nephrolithiasis - remote hx of ESWL - contrast CT (2024) several non obstructing left renal upper pole calculi measuring up to 10 mm  2.  High risk hematuria -former smoker -contrast CT (2024) -left nephrolithiasis -cysto (2024) -urethral caruncle  Chief Complaint  Patient presents with   Follow-up   HPI: Alexis Mcdonald is a 73 y.o. woman who presents today for recheck UA after a trial of vaginal estrogen cream to rule out vaginal atrophy as a cause of her microscopic hematuria.   Previous records reviewed.   She applied a vaginal cream for 1 month.  Patient denies any modifying or aggravating factors.  Patient denies any recent UTI's, gross hematuria, dysuria or suprapubic/flank pain.  Patient denies any fevers, chills, nausea or vomiting.    UA w/ 3-10 RBC's   PMH: Past Medical History:  Diagnosis Date   Borderline osteopenia    prior DEXA at [facility]    Cancer [facility])    Melanoma removed from right hip   Colon polyp 2009   next one due 2012   COVID-19 06/05/2021   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   [name]  by March onc eval [facility])   History of kidney stones    Hypothyroidism    Kidney stones    Neutropenia    no infections,  bone marrow biopsy by Alyse next week   renal calculi    s/p lithotripsy, [place]   Thyroid  disease    hypothyroidism    Surgical History: Past Surgical History:  Procedure Laterality Date   COLONOSCOPY  03/23/2010   Diverticulosis   COLONOSCOPY W/ BIOPSIES  03/14/2006   Tubular adenoma of the rectum x2, upper lesion with focal high-grade dysplasia.  Polyps 9 mm in size.   COLONOSCOPY WITH PROPOFOL  N/A 10/21/2018   Procedure: COLONOSCOPY WITH PROPOFOL ;  Surgeon: Dessa Reyes ORN, MD;  Location: [facility] ENDOSCOPY;  Service:  Endoscopy;  Laterality: N/A;   COLONOSCOPY WITH PROPOFOL  N/A 10/24/2023   Procedure: COLONOSCOPY WITH PROPOFOL ;  Surgeon: Unk Corinn Skiff, MD;  Location: [facility] ENDOSCOPY;  Service: Gastroenterology;  Laterality: N/A;   DILATION AND CURETTAGE OF UTERUS     JOINT REPLACEMENT  2005   done in [place]    kidney stones removal     MELANOMA EXCISION  06/29/2016   right hip replacement     SHOULDER ARTHROSCOPY WITH OPEN ROTATOR CUFF REPAIR Left 12/01/2018   Procedure: SHOULDER ARTHROSCOPY WITH OPEN ROTATOR CUFF REPAIR;  Surgeon: Edie Norleen PARAS, MD;  Location: [facility] ORS;  Service: Orthopedics;  Laterality: Left;   skin cancer removed throat  2024    Home Medications:  Allergies as of 07/22/2024       Reactions   Penicillins    Childhood allergy, welts Has patient had a PCN reaction causing immediate rash, facial/tongue/throat swelling, SOB or lightheadedness with hypotension: No Has patient had a PCN reaction causing severe rash involving mucus membranes or skin necrosis: Yes Has patient had a PCN reaction that required hospitalization: No Has patient had a PCN reaction occurring within the last 10 years: No If all of the above answers are NO, then may proceed with Cephalosporin use.   Betadine [povidone Iodine] Rash   Red bumps on labia area    Butenafine Rash   Morbilliform rash coverin entire body  June  2014   Terbinafine  And Related Rash   Morbilliform rash coverin entire body  June 2014        Medication List        Accurate as of July 22, 2024  2:18 PM. If you have any questions, ask your nurse or doctor.          ALPRAZolam  0.25 MG tablet Commonly known as: XANAX  Take 0.5 tablets (0.125 mg total) by mouth at bedtime as needed for sleep.   estradiol  0.1 MG/GM vaginal cream Commonly known as: ESTRACE  VAGINAL Apply 0.5mg  (pea-sized amount)  just inside the vaginal introitus with a finger-tip on Monday, Wednesday and Friday nights.   fish oil-omega-3 fatty acids 1000 MG  capsule Take 1 g by mouth daily.   fluticasone 50 MCG/ACT nasal spray Commonly known as: FLONASE Place 2 sprays into both nostrils daily.   ibuprofen 200 MG tablet Commonly known as: ADVIL Take 200 mg by mouth every 6 (six) hours as needed.   levothyroxine  100 MCG tablet Commonly known as: SYNTHROID  TAKE 1 TABLET BY MOUTH DAILY AND ONE EXTRA TABLET ONCE WEEKLY   nicotine polacrilex 2 MG gum Commonly known as: NICORETTE Take 2 mg by mouth as needed for smoking cessation.   psyllium 0.52 g capsule Commonly known as: REGULOID Take 0.52 g by mouth daily.        Allergies:  Allergies  Allergen Reactions   Penicillins     Childhood allergy, welts Has patient had a PCN reaction causing immediate rash, facial/tongue/throat swelling, SOB or lightheadedness with hypotension: No Has patient had a PCN reaction causing severe rash involving mucus membranes or skin necrosis: Yes Has patient had a PCN reaction that required hospitalization: No Has patient had a PCN reaction occurring within the last 10 years: No If all of the above answers are NO, then may proceed with Cephalosporin use.    Betadine [Povidone Iodine] Rash    Red bumps on labia area    Butenafine Rash    Morbilliform rash coverin entire body  June 2014   Terbinafine  And Related Rash    Morbilliform rash coverin entire body  June 2014    Family History: Family History  Problem Relation Age of Onset   Diabetes Mother    Stroke Mother    Hyperlipidemia Mother    Hypertension Mother    Alcohol abuse Father    Diabetes Brother    Alcohol abuse Brother    Hypertension Brother    Alcohol abuse Brother    Heart disease Maternal Uncle    Breast cancer Neg Hx     Social History:  reports that she quit smoking about 23 years ago. Her smoking use included cigarettes. She started smoking about 65 years ago. She has a 42 pack-year smoking history. She has never used smokeless tobacco. She reports current alcohol  use. She reports that she does not use drugs.  ROS: Pertinent ROS in HPI  Physical Exam: BP 135/80   Pulse 61   Ht 5' 5 (1.651 m)   Wt 120 lb (54.4 kg)   BMI 19.97 kg/m   Constitutional:  Well nourished. Alert and oriented, No acute distress. HEENT: [place] AT, moist mucus membranes.  Trachea midline Cardiovascular: No clubbing, cyanosis, or edema. Respiratory: Normal respiratory effort, no increased work of breathing. Neurologic: Grossly intact, no focal deficits, moving all 4 extremities. Psychiatric: Normal mood and affect.    Laboratory Data: See EPIC and HPI I have reviewed the labs.  See HPI.  Pertinent Imaging: N/a  Assessment & Plan:    1. Left renal stones - KUB cluster of left renal stones - No renal colic at today's appointment   2. Microscopic hematuria - UA w/ 3-10 RBC's  - Discussed AUA guidelines for intermediate risk hematuria and we decided on sending a urine cytology for further evaluation before pursuing any repeated imaging  3. Urethral caruncle - She did apply vaginal estrogen cream for 1 month and then discontinued  - Discussed that we should send the vaginal estrogen cream 3 nights weekly for several more months  Return for Follow up pending labs.  These notes generated with voice recognition software. I apologize for typographical errors.  CLOTILDA HELON RIGGERS  [facility] [address]  [address] [place], [place] [address] [phone]

## 2024-07-22 ENCOUNTER — Encounter: Payer: Self-pay | Admitting: Urology

## 2024-07-22 ENCOUNTER — Ambulatory Visit (INDEPENDENT_AMBULATORY_CARE_PROVIDER_SITE_OTHER): Admitting: Urology

## 2024-07-22 VITALS — BP 135/80 | HR 61 | Ht 65.0 in | Wt 120.0 lb

## 2024-07-22 DIAGNOSIS — R3129 Other microscopic hematuria: Secondary | ICD-10-CM | POA: Diagnosis not present

## 2024-07-22 DIAGNOSIS — N200 Calculus of kidney: Secondary | ICD-10-CM

## 2024-07-22 DIAGNOSIS — R82998 Other abnormal findings in urine: Secondary | ICD-10-CM | POA: Diagnosis not present

## 2024-07-22 DIAGNOSIS — N952 Postmenopausal atrophic vaginitis: Secondary | ICD-10-CM

## 2024-07-22 LAB — URINALYSIS, COMPLETE
Bilirubin, UA: NEGATIVE
Glucose, UA: NEGATIVE
Ketones, UA: NEGATIVE
Leukocytes,UA: NEGATIVE
Nitrite, UA: NEGATIVE
Protein,UA: NEGATIVE
Specific Gravity, UA: 1.025 (ref 1.005–1.030)
Urobilinogen, Ur: 0.2 mg/dL (ref 0.2–1.0)
pH, UA: 6 (ref 5.0–7.5)

## 2024-07-22 LAB — MICROSCOPIC EXAMINATION: Bacteria, UA: NONE SEEN

## 2024-07-29 DIAGNOSIS — D2271 Melanocytic nevi of right lower limb, including hip: Secondary | ICD-10-CM | POA: Diagnosis not present

## 2024-07-29 DIAGNOSIS — D2272 Melanocytic nevi of left lower limb, including hip: Secondary | ICD-10-CM | POA: Diagnosis not present

## 2024-07-29 DIAGNOSIS — L821 Other seborrheic keratosis: Secondary | ICD-10-CM | POA: Diagnosis not present

## 2024-07-29 DIAGNOSIS — Z85820 Personal history of malignant melanoma of skin: Secondary | ICD-10-CM | POA: Diagnosis not present

## 2024-07-29 DIAGNOSIS — Z08 Encounter for follow-up examination after completed treatment for malignant neoplasm: Secondary | ICD-10-CM | POA: Diagnosis not present

## 2024-07-29 DIAGNOSIS — L570 Actinic keratosis: Secondary | ICD-10-CM | POA: Diagnosis not present

## 2024-07-29 DIAGNOSIS — Z85828 Personal history of other malignant neoplasm of skin: Secondary | ICD-10-CM | POA: Diagnosis not present

## 2024-07-29 DIAGNOSIS — D225 Melanocytic nevi of trunk: Secondary | ICD-10-CM | POA: Diagnosis not present

## 2024-07-29 DIAGNOSIS — D2262 Melanocytic nevi of left upper limb, including shoulder: Secondary | ICD-10-CM | POA: Diagnosis not present

## 2024-07-29 DIAGNOSIS — D2261 Melanocytic nevi of right upper limb, including shoulder: Secondary | ICD-10-CM | POA: Diagnosis not present

## 2024-08-11 DIAGNOSIS — M19022 Primary osteoarthritis, left elbow: Secondary | ICD-10-CM | POA: Diagnosis not present

## 2024-08-11 DIAGNOSIS — M7702 Medial epicondylitis, left elbow: Secondary | ICD-10-CM | POA: Diagnosis not present

## 2024-08-12 DIAGNOSIS — M7702 Medial epicondylitis, left elbow: Secondary | ICD-10-CM | POA: Diagnosis not present

## 2024-08-12 DIAGNOSIS — M25522 Pain in left elbow: Secondary | ICD-10-CM | POA: Diagnosis not present

## 2024-08-19 DIAGNOSIS — F413 Other mixed anxiety disorders: Secondary | ICD-10-CM | POA: Diagnosis not present

## 2024-08-20 DIAGNOSIS — M25522 Pain in left elbow: Secondary | ICD-10-CM | POA: Diagnosis not present

## 2024-08-20 DIAGNOSIS — M7702 Medial epicondylitis, left elbow: Secondary | ICD-10-CM | POA: Diagnosis not present

## 2024-08-27 DIAGNOSIS — M7702 Medial epicondylitis, left elbow: Secondary | ICD-10-CM | POA: Diagnosis not present

## 2024-08-27 DIAGNOSIS — M25522 Pain in left elbow: Secondary | ICD-10-CM | POA: Diagnosis not present

## 2024-09-01 DIAGNOSIS — M25522 Pain in left elbow: Secondary | ICD-10-CM | POA: Diagnosis not present

## 2024-09-01 DIAGNOSIS — M7702 Medial epicondylitis, left elbow: Secondary | ICD-10-CM | POA: Diagnosis not present

## 2024-09-02 DIAGNOSIS — H02824 Cysts of left upper eyelid: Secondary | ICD-10-CM | POA: Diagnosis not present

## 2024-09-02 DIAGNOSIS — H43813 Vitreous degeneration, bilateral: Secondary | ICD-10-CM | POA: Diagnosis not present

## 2024-09-02 DIAGNOSIS — H2513 Age-related nuclear cataract, bilateral: Secondary | ICD-10-CM | POA: Diagnosis not present

## 2024-09-02 DIAGNOSIS — H0014 Chalazion left upper eyelid: Secondary | ICD-10-CM | POA: Diagnosis not present

## 2024-09-15 DIAGNOSIS — M25522 Pain in left elbow: Secondary | ICD-10-CM | POA: Diagnosis not present

## 2024-09-15 DIAGNOSIS — M7702 Medial epicondylitis, left elbow: Secondary | ICD-10-CM | POA: Diagnosis not present

## 2024-09-23 DIAGNOSIS — Z23 Encounter for immunization: Secondary | ICD-10-CM | POA: Diagnosis not present

## 2024-09-30 DIAGNOSIS — F413 Other mixed anxiety disorders: Secondary | ICD-10-CM | POA: Diagnosis not present

## 2024-10-06 DIAGNOSIS — M7702 Medial epicondylitis, left elbow: Secondary | ICD-10-CM | POA: Diagnosis not present

## 2024-10-06 DIAGNOSIS — M25522 Pain in left elbow: Secondary | ICD-10-CM | POA: Diagnosis not present

## 2024-10-19 ENCOUNTER — Other Ambulatory Visit: Payer: Self-pay | Admitting: Internal Medicine

## 2024-10-20 DIAGNOSIS — M25522 Pain in left elbow: Secondary | ICD-10-CM | POA: Diagnosis not present

## 2024-10-20 DIAGNOSIS — M7702 Medial epicondylitis, left elbow: Secondary | ICD-10-CM | POA: Diagnosis not present

## 2024-10-27 DIAGNOSIS — M7702 Medial epicondylitis, left elbow: Secondary | ICD-10-CM | POA: Diagnosis not present

## 2024-10-27 DIAGNOSIS — M25522 Pain in left elbow: Secondary | ICD-10-CM | POA: Diagnosis not present

## 2024-11-03 ENCOUNTER — Ambulatory Visit (INDEPENDENT_AMBULATORY_CARE_PROVIDER_SITE_OTHER): Admitting: Student in an Organized Health Care Education/Training Program

## 2024-11-03 ENCOUNTER — Encounter: Payer: Self-pay | Admitting: Student in an Organized Health Care Education/Training Program

## 2024-11-03 VITALS — Ht 65.0 in | Wt 117.0 lb

## 2024-11-03 DIAGNOSIS — R911 Solitary pulmonary nodule: Secondary | ICD-10-CM

## 2024-11-05 DIAGNOSIS — M7702 Medial epicondylitis, left elbow: Secondary | ICD-10-CM | POA: Diagnosis not present

## 2024-11-05 DIAGNOSIS — M25522 Pain in left elbow: Secondary | ICD-10-CM | POA: Diagnosis not present

## 2024-11-05 NOTE — Progress Notes (Signed)
 This encounter was cancelled and rescheduled. No charge.

## 2024-11-11 DIAGNOSIS — M25522 Pain in left elbow: Secondary | ICD-10-CM | POA: Diagnosis not present

## 2024-11-11 DIAGNOSIS — M7702 Medial epicondylitis, left elbow: Secondary | ICD-10-CM | POA: Diagnosis not present

## 2024-11-17 NOTE — Telephone Encounter (Signed)
 open in error

## 2025-01-05 NOTE — Progress Notes (Signed)
 "    01/06/2025 3:01 PM   Alexis Mcdonald 05-18-1951 [id number]  Referring provider: Marylynn Verneita CROME, MD [address] [place],  [place] [address]  Urological history: 1.  Nephrolithiasis - remote hx of ESWL - contrast CT (2024) several non obstructing left renal upper pole calculi measuring up to 10 mm  2.  High risk hematuria -former smoker -contrast CT (2024) -left nephrolithiasis -cysto (2024) -urethral caruncle  Chief Complaint  Patient presents with   Follow-up   Nephrolithiasis   HPI: Alexis Mcdonald is a 74 y.o. woman who presents today for 6 months follow up.   Previous records reviewed.   She has a history of microscopic hematuria underwent a contrast CT and cystoscopy in 2024 and findings for left nephrolithiasis and urethral caruncle.  Urine cytology in July was negative.  She follows up today for repeat check on UA after being more consistent with the vaginal estrogen cream.  She has applying vaginal estrogen cream 3 nights weekly.  Patient denies any modifying or aggravating factors.  Patient denies any recent UTI's, gross hematuria, dysuria or suprapubic/flank pain.  Patient denies any fevers, chills, nausea or vomiting.    UA yellow clear, specific gravity 1.025, pH 6.5, 2+ heme, trace ketone, 0-5 WBCs, 11-30 RBCs, greater than 10 epithelial cells, mucus threads present and few bacteria.   PMH: Past Medical History:  Diagnosis Date   Borderline osteopenia    prior D[facility] at [facility]    Cancer [facility])    Melanoma removed from right hip   Colon polyp 2009   next one due 2012   COVID-19 06/05/2021   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   [name]  by March onc eval [facility])   History of kidney stones    Hypothyroidism    Kidney stones    Neutropenia    no infections,  bone marrow biopsy by Alyse next week   renal calculi    s/p lithotripsy, [place]   Thyroid  disease    hypothyroidism    Surgical History: Past  Surgical History:  Procedure Laterality Date   COLONOSCOPY  03/23/2010   Diverticulosis   COLONOSCOPY W/ BIOPSIES  03/14/2006   Tubular adenoma of the rectum x2, upper lesion with focal high-grade dysplasia.  Polyps 9 mm in size.   COLONOSCOPY WITH PROPOFOL  N/A 10/21/2018   Procedure: COLONOSCOPY WITH PROPOFOL ;  Surgeon: Dessa Reyes ORN, MD;  Location: [facility] ENDOSCOPY;  Service: Endoscopy;  Laterality: N/A;   COLONOSCOPY WITH PROPOFOL  N/A 10/24/2023   Procedure: COLONOSCOPY WITH PROPOFOL ;  Surgeon: Unk Corinn Skiff, MD;  Location: [facility] ENDOSCOPY;  Service: Gastroenterology;  Laterality: N/A;   DILATION AND CURETTAGE OF UTERUS     JOINT REPLACEMENT  2005   done in [place]    kidney stones removal     MELANOMA EXCISION  06/29/2016   right hip replacement     SHOULDER ARTHROSCOPY WITH OPEN ROTATOR CUFF REPAIR Left 12/01/2018   Procedure: SHOULDER ARTHROSCOPY WITH OPEN ROTATOR CUFF REPAIR;  Surgeon: Edie Norleen PARAS, MD;  Location: [facility] ORS;  Service: Orthopedics;  Laterality: Left;   skin cancer removed throat  2024    Home Medications:  Allergies as of 01/06/2025       Reactions   Penicillins    Childhood allergy, welts Has patient had a PCN reaction causing immediate rash, facial/tongue/throat swelling, SOB or lightheadedness with hypotension: No Has patient had a PCN reaction causing severe rash involving mucus membranes or skin necrosis: Yes Has patient  had a PCN reaction that required hospitalization: No Has patient had a PCN reaction occurring within the last 10 years: No If all of the above answers are NO, then may proceed with Cephalosporin use.   Betadine [povidone Iodine] Rash   Red bumps on labia area    Butenafine Rash   Morbilliform rash coverin entire body  June 2014   Terbinafine  And Related Rash   Morbilliform rash coverin entire body  June 2014        Medication List        Accurate as of January 06, 2025  3:01 PM. If you have any questions, ask your nurse  or doctor.          ALPRAZolam  0.25 MG tablet Commonly known as: XANAX  Take 0.5 tablets (0.125 mg total) by mouth at bedtime as needed for sleep.   estradiol  0.1 MG/GM vaginal cream Commonly known as: ESTRACE  VAGINAL Apply 0.5mg  (pea-sized amount)  just inside the vaginal introitus with a finger-tip on Monday, Wednesday and Friday nights.   fish oil-omega-3 fatty acids 1000 MG capsule Take 1 g by mouth daily.   ibuprofen 200 MG tablet Commonly known as: ADVIL Take 200 mg by mouth every 6 (six) hours as needed.   levothyroxine  100 MCG tablet Commonly known as: SYNTHROID  TAKE 1 TABLET BY MOUTH DAILY AND ONE EXTRA TABLET ONCE WEEKLY   nicotine polacrilex 2 MG gum Commonly known as: NICORETTE Take 2 mg by mouth as needed for smoking cessation.   psyllium 0.52 g capsule Commonly known as: REGULOID Take 0.52 g by mouth daily.        Allergies:  Allergies  Allergen Reactions   Penicillins     Childhood allergy, welts Has patient had a PCN reaction causing immediate rash, facial/tongue/throat swelling, SOB or lightheadedness with hypotension: No Has patient had a PCN reaction causing severe rash involving mucus membranes or skin necrosis: Yes Has patient had a PCN reaction that required hospitalization: No Has patient had a PCN reaction occurring within the last 10 years: No If all of the above answers are NO, then may proceed with Cephalosporin use.    Betadine [Povidone Iodine] Rash    Red bumps on labia area    Butenafine Rash    Morbilliform rash coverin entire body  June 2014   Terbinafine  And Related Rash    Morbilliform rash coverin entire body  June 2014    Family History: Family History  Problem Relation Age of Onset   Diabetes Mother    Stroke Mother    Hyperlipidemia Mother    Hypertension Mother    Alcohol abuse Father    Diabetes Brother    Alcohol abuse Brother    Hypertension Brother    Alcohol abuse Brother    Heart disease Maternal Uncle     Breast cancer Neg Hx     Social History:  reports that she quit smoking about 24 years ago. Her smoking use included cigarettes. She started smoking about 66 years ago. She has a 42 pack-year smoking history. She has never used smokeless tobacco. She reports current alcohol use. She reports that she does not use drugs.  ROS: Pertinent ROS in HPI  Physical Exam: BP (!) 152/80   Pulse 62   Wt 122 lb (55.3 kg)   SpO2 99%   BMI 20.30 kg/m   Constitutional:  Well nourished. Alert and oriented, No acute distress. HEENT: [place] AT, moist mucus membranes.  Trachea midline Cardiovascular: No clubbing, cyanosis, or  edema. Respiratory: Normal respiratory effort, no increased work of breathing.  Neurologic: Grossly intact, no focal deficits, moving all 4 extremities. Psychiatric: Normal mood and affect.    Laboratory Data: See EPIC and HPI I have reviewed the labs.     Pertinent Imaging: N/A  Assessment & Plan:    1. Left renal stones - She has not had any flank pain or passage of fragments - This may be the source of hematuria, but CT urogram is pending  2. Microscopic hematuria - UA 11-30 RBC's  - We reviewed that hematuria (blood in the urine) may result from a variety of causes, including nephrolithiasis (kidney stones), benign prostatic hyperplasia, urinary tract infections (UTIs), trauma or structural abnormalities of the urinary tract, and malignancy. It was noted that in some cases, no definitive etiology is identified despite thorough evaluation. -We discussed that a CT urogram involves intravenous administration of contrast material to enhance imaging of the urinary tract. Although rare, contrast reactions may occur, with severe reactions estimated at approximately 1 in 100,000. The patient denies any known allergies to iodinated contrast, iodine, or seafood, and is not currently taking metformin.  - patient had a questionable reaction to Betadine, but she feels it irritated her  because it was not washed off  Orders Placed: CT urogram Urinalysis (UA)  -We did give her the number for scheduling so if she does not hear from the schedulers in a week, I have instructed her to call to schedule the CT urogram -She also prefers to get her results through MyChart and I asked her to contact us  if she has not heard from us  within a week after she has had her CT urogram    3. Urethral caruncle - Continue vaginal estrogen cream 3 nights weekly I am sure  Return for I will call patient with results.  These notes generated with voice recognition software. I apologize for typographical errors.  Alexis Mcdonald  [facility] [address]  [address] [place], [place] [address] [phone]  "

## 2025-01-06 ENCOUNTER — Encounter: Payer: Self-pay | Admitting: Urology

## 2025-01-06 ENCOUNTER — Ambulatory Visit: Admitting: Urology

## 2025-01-06 VITALS — BP 152/80 | HR 62 | Wt 122.0 lb

## 2025-01-06 DIAGNOSIS — N200 Calculus of kidney: Secondary | ICD-10-CM

## 2025-01-06 DIAGNOSIS — R3129 Other microscopic hematuria: Secondary | ICD-10-CM

## 2025-01-06 DIAGNOSIS — N362 Urethral caruncle: Secondary | ICD-10-CM

## 2025-01-06 LAB — URINALYSIS, COMPLETE
Bilirubin, UA: NEGATIVE
Glucose, UA: NEGATIVE
Leukocytes,UA: NEGATIVE
Nitrite, UA: NEGATIVE
Protein,UA: NEGATIVE
Specific Gravity, UA: 1.025 (ref 1.005–1.030)
Urobilinogen, Ur: 0.2 mg/dL (ref 0.2–1.0)
pH, UA: 6.5 (ref 5.0–7.5)

## 2025-01-06 LAB — MICROSCOPIC EXAMINATION: Epithelial Cells (non renal): >10 /HPF — AB (ref 0–10)

## 2025-01-06 NOTE — Patient Instructions (Signed)
 Please contact scheduling at [phone] to schedule the CT scan

## 2025-01-11 ENCOUNTER — Ambulatory Visit: Payer: Self-pay

## 2025-01-12 ENCOUNTER — Ambulatory Visit
Admission: RE | Admit: 2025-01-12 | Discharge: 2025-01-12 | Disposition: A | Discharge status: Home / Self Care | Source: Ambulatory Visit | Attending: Urology | Admitting: Urology

## 2025-01-12 DIAGNOSIS — R3129 Other microscopic hematuria: Secondary | ICD-10-CM | POA: Insufficient documentation

## 2025-01-12 MED ORDER — IOHEXOL 300 MG/ML  SOLN
100.0000 mL | Freq: Once | INTRAMUSCULAR | Status: AC | PRN
  Administered 2025-01-12: 100 mL via INTRAVENOUS

## 2025-01-12 MED ORDER — SODIUM CHLORIDE 0.9 % IV SOLN: INTRAVENOUS | Status: DC

## 2025-01-18 ENCOUNTER — Ambulatory Visit: Payer: Self-pay | Admitting: Urology

## 2025-01-25 NOTE — Telephone Encounter (Signed)
-----   Message from [facility] sent at 01/22/2025  8:48 AM EST ----- Will you get her rescheduled with Dr. Georganne or Dr. Francisca

## 2025-01-25 NOTE — Telephone Encounter (Signed)
 Spoke with patient in regards to appointment for cystoscopy, patient appointment changed to Dr. Georganne. Patient verbalized understanding of appointment date and time.  Andrea Kirks LPN

## 2025-01-31 ENCOUNTER — Other Ambulatory Visit: Payer: Self-pay | Admitting: Internal Medicine

## 2025-02-02 ENCOUNTER — Other Ambulatory Visit: Admitting: Urology

## 2025-02-02 NOTE — Progress Notes (Unsigned)
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 Cystoscopy was performed with a flexible cystoscope. The external vaginal anatomy, urethra, bladder neck and bladder mucosa were visualized in a systematic fashion. The ureteral orifices were noted in orthotopic location and orientation. There were no bladder mucosal lesions, stones, debris or anatomic variants noted.   Imaging: Recent CTU  reviewed - (01/12/25)  IMPRESSION: 1. Left nephrolithiasis, without obstructive uropathy. 2. No other explanation for hematuria. 3. Possible constipation. 4. 2 mm left lower lobe pulmonary nodule, not readily apparent on the prior. This was detailed on 10/06/2023 chest CT. 5.  Aortic Atherosclerosis (ICD10-I70.0).  Findings: ***  Assessment and Plan: ***  Alexis Skye, MD 02/02/2025   "

## 2025-02-09 ENCOUNTER — Other Ambulatory Visit: Admitting: Urology

## 2025-02-28 ENCOUNTER — Ambulatory Visit

## 2025-03-03 ENCOUNTER — Ambulatory Visit: Admitting: Student in an Organized Health Care Education/Training Program

## 2025-03-08 ENCOUNTER — Ambulatory Visit: Admitting: Student in an Organized Health Care Education/Training Program

## 2025-03-14 ENCOUNTER — Ambulatory Visit

## 2025-03-16 ENCOUNTER — Encounter: Admitting: Internal Medicine
